# Patient Record
Sex: Female | Born: 1975 | Race: Black or African American | Hispanic: No | Marital: Single | Smoking: Never smoker
Health system: Southern US, Community
[De-identification: ages and names within clinical notes are randomized; demographics above are authoritative.]

## PROBLEM LIST (undated history)

## (undated) DIAGNOSIS — E119 Type 2 diabetes mellitus without complications: Secondary | ICD-10-CM

## (undated) DIAGNOSIS — I10 Essential (primary) hypertension: Secondary | ICD-10-CM

## (undated) DIAGNOSIS — I639 Cerebral infarction, unspecified: Secondary | ICD-10-CM

---

## 2006-06-02 ENCOUNTER — Emergency Department: Payer: Self-pay | Admitting: Internal Medicine

## 2007-12-18 ENCOUNTER — Emergency Department: Payer: Self-pay

## 2008-07-02 ENCOUNTER — Ambulatory Visit: Payer: Self-pay | Admitting: Obstetrics and Gynecology

## 2008-07-03 ENCOUNTER — Inpatient Hospital Stay: Payer: Self-pay | Admitting: Obstetrics and Gynecology

## 2009-05-05 ENCOUNTER — Emergency Department: Payer: Self-pay | Admitting: Emergency Medicine

## 2011-11-02 ENCOUNTER — Encounter: Payer: Self-pay | Admitting: Maternal & Fetal Medicine

## 2011-11-06 ENCOUNTER — Ambulatory Visit: Payer: Self-pay | Admitting: Obstetrics and Gynecology

## 2011-11-06 ENCOUNTER — Encounter: Payer: Self-pay | Admitting: Maternal & Fetal Medicine

## 2011-12-01 ENCOUNTER — Ambulatory Visit: Payer: Self-pay | Admitting: Obstetrics and Gynecology

## 2011-12-14 ENCOUNTER — Ambulatory Visit: Payer: Self-pay | Admitting: Obstetrics and Gynecology

## 2011-12-14 LAB — CBC WITH DIFFERENTIAL/PLATELET
Basophil #: 0 10*3/uL (ref 0.0–0.1)
Basophil %: 0.3 %
Eosinophil #: 0.1 10*3/uL (ref 0.0–0.7)
Eosinophil %: 0.8 %
HCT: 34.6 % — ABNORMAL LOW (ref 35.0–47.0)
MCH: 31.2 pg (ref 26.0–34.0)
MCV: 87 fL (ref 80–100)
Monocyte %: 7.5 %
Platelet: 219 10*3/uL (ref 150–440)
RBC: 3.96 10*6/uL (ref 3.80–5.20)
RDW: 14.7 % — ABNORMAL HIGH (ref 11.5–14.5)

## 2011-12-15 ENCOUNTER — Inpatient Hospital Stay: Payer: Self-pay | Admitting: Obstetrics and Gynecology

## 2011-12-15 LAB — PIH PROFILE
Anion Gap: 9 (ref 7–16)
Chloride: 106 mmol/L (ref 98–107)
Co2: 24 mmol/L (ref 21–32)
Creatinine: 0.78 mg/dL (ref 0.60–1.30)
EGFR (Non-African Amer.): >60
HCT: 28.8 % — ABNORMAL LOW (ref 35.0–47.0)
HGB: 10.1 g/dL — ABNORMAL LOW (ref 12.0–16.0)
MCH: 31.1 pg (ref 26.0–34.0)
MCHC: 35 g/dL (ref 32.0–36.0)
RDW: 14.6 % — ABNORMAL HIGH (ref 11.5–14.5)
Sodium: 139 mmol/L (ref 136–145)
Uric Acid: 6.4 mg/dL — ABNORMAL HIGH (ref 2.6–6.0)
WBC: 9.4 10*3/uL (ref 3.6–11.0)

## 2011-12-15 LAB — PROTEIN / CREATININE RATIO, URINE
Protein, Random Urine: 10 mg/dL (ref 0–12)
Protein/Creat. Ratio: 205 mg/gCREAT — ABNORMAL HIGH (ref 0–200)

## 2011-12-16 LAB — CBC WITH DIFFERENTIAL/PLATELET
Eosinophil #: 0 10*3/uL (ref 0.0–0.7)
Eosinophil %: 0.2 %
Lymphocyte #: 1.5 10*3/uL (ref 1.0–3.6)
MCH: 30.9 pg (ref 26.0–34.0)
MCHC: 35.2 g/dL (ref 32.0–36.0)
Monocyte #: 0.5 x10 3/mm (ref 0.2–0.9)
Neutrophil #: 9.1 10*3/uL — ABNORMAL HIGH (ref 1.4–6.5)
Neutrophil %: 81.4 %
Platelet: 217 10*3/uL (ref 150–440)
RBC: 3.17 10*6/uL — ABNORMAL LOW (ref 3.80–5.20)

## 2011-12-16 LAB — COMPREHENSIVE METABOLIC PANEL
Alkaline Phosphatase: 119 U/L (ref 50–136)
Anion Gap: 8 (ref 7–16)
BUN: 5 mg/dL — ABNORMAL LOW (ref 7–18)
Bilirubin,Total: 0.4 mg/dL (ref 0.2–1.0)
Calcium, Total: 8.5 mg/dL (ref 8.5–10.1)
Co2: 25 mmol/L (ref 21–32)
Creatinine: 0.95 mg/dL (ref 0.60–1.30)
EGFR (Non-African Amer.): >60
Osmolality: 277 (ref 275–301)
Potassium: 4 mmol/L (ref 3.5–5.1)
SGOT(AST): 37 U/L (ref 15–37)
Sodium: 139 mmol/L (ref 136–145)
Total Protein: 6.2 g/dL — ABNORMAL LOW (ref 6.4–8.2)

## 2014-05-19 NOTE — Consult Note (Signed)
Referral Information:   Reason for Referral 39 year old G4 P2012 with EDD 12/17/11 by LMP c/w US performed at [place] on 08/16/11; ega 22w 2 days at that time. She is presently 34 weeks 1 day and referred for newly diagnosed Gestational Diabetes.    Referring Physician [facility]    Prenatal Hx as above    Past Obstetrical Hx 2000 7lb 14 oz, female, c/s ([place]) 2010 9 lb, female, c/s [facility]) repeat TAb x 1   Home Medications: Medication Instructions Status  Prenatal Multivitamins oral tablet 1  orally once a day (at bedtime)  Active   Allergies:   NKA: None  Vital Signs/Notes:  Nursing Vital Signs:  **Vital Signs.:   07-Oct-13 11:25   Vital Signs Type Routine   Pulse Pulse 100   Pulse source if not from Vital Sign Device dinamap   Respirations Respirations 16   Systolic BP Systolic BP 902   Diastolic BP (mmHg) Diastolic BP (mmHg) 82   Mean BP 101   BP Source  if not from Vital Sign Device dinamap   Perinatal Consult:   PMed Hx Rubella Immune, Hx of varicella    Past Medical History cont'd negative    PSurg Hx c/s x 2    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  She initially expressed disbelief in the diagnosis of GDM and barriers to frequent visits for surveillance (new job in nursing care facility, inability to take time off work).  We are planning her follow up visits during her lunch hour. She met with the [facility]yles center today, received her supplies, and was prepared to follow diet and surveillance plans.We addressed the concerns associated with poorly controlled gestational diabetes including fetal macrosomia and stillbirth.  She receieved a log book and understand the blood glucose target goals.   2. She has had two prior cesarean deliveries and plans a  repeat cesarean section.    Recommendations 1. [facility] Education (performed today) Pt has log book, meter, supplies and received nutritional education 2. Growth scan and surveillance as outlined below. Can intiate weekly antenatal testing next week 3. We have scheduled a follow up mfm consult next week (during patients lunch hour) to review blood glucose log.  We addressed intiation of oral Rx if blood glucose values are elevated despite nutritional changes 4. I will plan to address other aspects of long term risk associated with GDM in pregnancy and her ability to modify risks (eg. breastfeeding, exercise/weight loss) during her next visit. 5.  If you would like her to have any of her antental testing or follow up ultrasounds at the time of her mfm consultations, please let us know.  Thank you.   Plan:   Ultrasound at what gestational ages Monthly > 28 weeks    Antepartum Testing Weekly, Starting at 34 to 36 weeks, can initiate next week    Delivery Mode Cesarean    Delivery at what gestational age [redacted] weeks, sooner, if evidence of fetopathy     Total Time Spent with Patient 15 minutes    >50% of visit spent in couseling/coordination of care yes    Office Use Only [address]  Level 1 (44min) NEW office consult prob focused   Coding Description: MATERNAL CONDITIONS/HISTORY INDICATION(S).   OTHER: gestational  diabetes.  Electronic Signatures: Manfred Shirts (MD)  (Signed 07-Oct-13 15:26)  Authored: Referral, Home Medications, Allergies, Vital Signs/Notes, Consult, Lab/Radiology Notes, Impression, Plan, Billing, Coding Description   Last Updated: 07-Oct-13 15:26 by Manfred Shirts (MD)

## 2014-05-19 NOTE — Op Note (Signed)
PATIENT NAME:  Cheyenne Martinez, Cheyenne Martinez MR#:  [id number] DATE OF BIRTH:  1975-09-10  DATE OF PROCEDURE:  12/15/2011  PREOPERATIVE DIAGNOSES:  1. Intrauterine pregnancy at 39 weeks, five days gestation.  2. History of two previous low transverse cesarean sections.   POSTOPERATIVE DIAGNOSES:  1. Intrauterine pregnancy at 39 weeks, five days gestation.  2. History of two previous low transverse cesarean sections.   PROCEDURE PERFORMED: Repeat low transverse cesarean section.   ANESTHESIA: Spinal.   PRIMARY SURGEON: Lorrene ReidAndreas Martinez Shelsy Seng, MD  OPERATIVE FLUIDS: 2400 mL of crystalloid.   URINE OUTPUT: 180 mL urine.   ESTIMATED BLOOD LOSS:  One liter.  DRAINS OR TUBES: On-Q catheters as well as a Foley to gravity drainage.   PREOPERATIVE ANTIBIOTICS: 2 grams Ancef.   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 Upon entry into the peritoneal cavity there was a peritoneal window which allowed  access to the uterus. However, there was a thick adhesion of the uterus to the rectus muscle sheath and peritoneum which caused difficulty in gaining appropriate access to the lower uterine segment. The bladder was unable to be displaced off the lower uterine segment. The incision was made low transverse. However, given the thickness of the myometrium at the area of the incision, it cannot be ruled out that the contractile portion of the uterus was entered. The delivery resulted in the birth of a liveborn female infant weighing 3820 grams, Apgars 8 and 9. Given the difficult nature of the cesarean section and concern of location of the bladder, the bladder was retrograde filled with sterile milk following closure of the hysterotomy incision, noting the bladder to be an intact without extravasation of milk. Given the location of the hysterotomy as well as the adhesions encountered, the patient was advised not to attempt future pregnancy.  If she were to become pregnant again, I  would recommend repeat C-section at 37 weeks given concern over entry into the contractile portion of the myometrium, as well as a vertical skin incision for entry.   SPECIMENS REMOVED: None.   PATIENT CONDITION FOLLOWING PROCEDURE: Stable.   PROCEDURE IN DETAIL: The risks, benefits, and alternatives of the procedure were discussed with the patient prior to proceeding to the operating room. The patient was administered spinal anesthesia and positioned in the supine position. She was prepped and draped in the usual sterile fashion. Time-out was performed prior to beginning the procedure. The level of anesthetic was checked and noted to be adequate. A Pfannenstiel skin incision was made 2 cm above the pubic symphysis utilizing the patient's pre-existing scar. This was carried down sharply to the level of the rectus fascia. The fascia was incised in the midline and the fascial incision was extended using Mayo scissors. The superior border of the rectus fascia was grasped with two Kocher  clamps. The underlying rectus muscles were bluntly dissected off the rectus sheath and the median raphe was fairly scarred and was taken down using the knife. The inferior border of the rectus fascia was dissected off the rectus muscles in a similar fashion. The midline was identified. The peritoneum was grasped with a hemostat and incised using Metzenbaum scissors. Following this, the peritoneal incision was extended using manual traction. However, it was noted that there was significant adhesive disease of the uterus to the rectus muscles and peritoneum. Several thick bands of scar tissue to the previous hysterotomy incision had to be taken down using the Bovie. Following this, the bladder was identified  but scarred to prior hysterotomy incision. A bladder flap could therefore not be created. A hysterotomy incision was made just above the bladder reflection. This was carried down sharply using the knife. The uterus was entered  bluntly using the operator's finger. The hysterotomy incision was extended using manual traction. Amniotomy was performed using an Allis clamp, noting clear fluid. The operator's hand was placed into the hysterotomy incision, finding the infant in the vertex position. The head was grasped, flexed, brought to the incision, and delivered atraumatically using fundal pressure. Cord was clamped and cut. The infant was suctioned and passed to the awaiting pediatricians. The placenta was delivered using manual extraction. Sufficient lysis of adhesions had been undertaken to allow exteriorization of the uterus. The placenta was delivered using manual extraction. The uterus was wiped clean of clots and debris. The hysterotomy incision was then repaired using a two-layer closure of 0 Vicryl, the first being a running locked, the second a vertical imbricating layer. There was some defect superior to the hysterotomy incision where some adhesions were taken down. This was also closed using a running 0 Vicryl locked stitch. Several figure-of-eights were placed along the hysterotomy incision in order to achieve adequate hemostasis. The bladder was noted to be away from the hysterotomy incision, but close enough that the decision was made to instill the bladder with sterile milk. Indigo carmine had been given IV prior to the hysterotomy incision. There was no extravasation of blue dye noted during the case.  Urine was blue at this time. Approximately 120 mL of sterile milk were instilled in the patient's bladder with no extravasation noted. The ON-Q catheters were placed in the usual fashion. Following placement of the On-Q catheters, the fascia was closed using a single #1 looped PDS in a running fashion. The subcutaneous tissue was irrigated and hemostasis was achieved using the Bovie. Skin was closed using staples. Sponge, needle, and instrument counts were correct times two. The patient was taken to the recovery room in stable  condition. As previously mentioned, there was concern for entry into the contractile portion of the myometrium. The patient should strongly considering not trying to achieve future pregnancies given the amount of adhesive disease encountered. If she does become pregnant, she was counseled that she will need a repeat C-section at 37 weeks and I would  recommend a vertical skin incision for gaining entry into the peritoneal cavity.    ____________________________ Florina Ou. Bonney Aid, MD ams:bjt D: 12/16/2011 23:07:31 ET T: 12/17/2011 13:06:53 ET JOB#: [id number]  cc: Florina Ou. Bonney Aid, MD, <Dictator> Lorrene Reid MD ELECTRONICALLY SIGNED 12/19/2011 22:03

## 2015-04-29 NOTE — Telephone Encounter (Signed)
Opened in error.  Wrong patient (multiple Elizbeth Trinka's) selected.

## 2015-05-03 ENCOUNTER — Emergency Department
Admission: EM | Admit: 2015-05-03 | Discharge: 2015-05-03 | Disposition: A | Discharge status: Home / Self Care | Payer: Self-pay | Attending: Emergency Medicine | Admitting: Emergency Medicine

## 2015-05-03 ENCOUNTER — Encounter: Payer: Self-pay | Admitting: Emergency Medicine

## 2015-05-03 DIAGNOSIS — T148 Other injury of unspecified body region: Secondary | ICD-10-CM | POA: Insufficient documentation

## 2015-05-03 DIAGNOSIS — Y998 Other external cause status: Secondary | ICD-10-CM | POA: Insufficient documentation

## 2015-05-03 DIAGNOSIS — Y9389 Activity, other specified: Secondary | ICD-10-CM | POA: Insufficient documentation

## 2015-05-03 DIAGNOSIS — I10 Essential (primary) hypertension: Secondary | ICD-10-CM

## 2015-05-03 DIAGNOSIS — T148XXA Other injury of unspecified body region, initial encounter: Secondary | ICD-10-CM

## 2015-05-03 DIAGNOSIS — Z79899 Other long term (current) drug therapy: Secondary | ICD-10-CM | POA: Insufficient documentation

## 2015-05-03 DIAGNOSIS — Y92410 Unspecified street and highway as the place of occurrence of the external cause: Secondary | ICD-10-CM | POA: Insufficient documentation

## 2015-05-03 MED ORDER — NAPROXEN 500 MG PO TABS
500.0000 mg | ORAL_TABLET | Freq: Once | ORAL | Status: AC
  Administered 2015-05-03: 500 mg via ORAL
  Filled 2015-05-03: qty 1

## 2015-05-03 MED ORDER — NAPROXEN 500 MG PO TABS: 500.0000 mg | ORAL_TABLET | Freq: Two times a day (BID) | ORAL | Status: AC

## 2015-05-03 MED ORDER — CYCLOBENZAPRINE HCL 10 MG PO TABS
5.0000 mg | ORAL_TABLET | Freq: Once | ORAL | Status: AC
  Administered 2015-05-03: 5 mg via ORAL
  Filled 2015-05-03: qty 1

## 2015-05-03 MED ORDER — CYCLOBENZAPRINE HCL 5 MG PO TABS: 5.0000 mg | ORAL_TABLET | Freq: Three times a day (TID) | ORAL | Status: DC | PRN
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
## 2015-05-03 NOTE — Discharge Instructions (Signed)

## 2015-05-03 NOTE — ED Notes (Signed)
AAOx3.  Skin warm and dry.  NAD 
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
## 2021-07-01 ENCOUNTER — Emergency Department: Payer: Managed Care, Other (non HMO)

## 2021-07-01 ENCOUNTER — Observation Stay: Payer: Managed Care, Other (non HMO)

## 2021-07-01 ENCOUNTER — Inpatient Hospital Stay
Admission: EM | Admit: 2021-07-01 | Discharge: 2021-07-04 | DRG: 066 | Disposition: A | Discharge status: Home / Self Care | Payer: Managed Care, Other (non HMO) | Attending: Internal Medicine | Admitting: Internal Medicine

## 2021-07-01 DIAGNOSIS — R29701 NIHSS score 1: Secondary | ICD-10-CM | POA: Diagnosis present

## 2021-07-01 DIAGNOSIS — Z833 Family history of diabetes mellitus: Secondary | ICD-10-CM

## 2021-07-01 DIAGNOSIS — E785 Hyperlipidemia, unspecified: Secondary | ICD-10-CM | POA: Diagnosis not present

## 2021-07-01 DIAGNOSIS — I10 Essential (primary) hypertension: Secondary | ICD-10-CM | POA: Diagnosis present

## 2021-07-01 DIAGNOSIS — G9389 Other specified disorders of brain: Secondary | ICD-10-CM | POA: Diagnosis present

## 2021-07-01 DIAGNOSIS — E1165 Type 2 diabetes mellitus with hyperglycemia: Secondary | ICD-10-CM | POA: Diagnosis present

## 2021-07-01 DIAGNOSIS — R4702 Dysphasia: Secondary | ICD-10-CM | POA: Diagnosis present

## 2021-07-01 DIAGNOSIS — Z8249 Family history of ischemic heart disease and other diseases of the circulatory system: Secondary | ICD-10-CM

## 2021-07-01 DIAGNOSIS — E119 Type 2 diabetes mellitus without complications: Secondary | ICD-10-CM | POA: Diagnosis not present

## 2021-07-01 DIAGNOSIS — I63532 Cerebral infarction due to unspecified occlusion or stenosis of left posterior cerebral artery: Principal | ICD-10-CM | POA: Diagnosis present

## 2021-07-01 DIAGNOSIS — R4182 Altered mental status, unspecified: Secondary | ICD-10-CM | POA: Diagnosis present

## 2021-07-01 DIAGNOSIS — I160 Hypertensive urgency: Secondary | ICD-10-CM

## 2021-07-01 DIAGNOSIS — R001 Bradycardia, unspecified: Secondary | ICD-10-CM | POA: Diagnosis present

## 2021-07-01 DIAGNOSIS — E7800 Pure hypercholesterolemia, unspecified: Secondary | ICD-10-CM | POA: Diagnosis present

## 2021-07-01 DIAGNOSIS — I639 Cerebral infarction, unspecified: Secondary | ICD-10-CM | POA: Diagnosis not present

## 2021-07-01 LAB — COMPREHENSIVE METABOLIC PANEL
ALT: 8 U/L (ref 0–44)
AST: 15 U/L (ref 15–41)
Albumin: 3.3 g/dL — ABNORMAL LOW (ref 3.5–5.0)
Alkaline Phosphatase: 71 U/L (ref 38–126)
Anion gap: 11 (ref 5–15)
BUN: 8 mg/dL (ref 6–20)
CO2: 22 mmol/L (ref 22–32)
Calcium: 8.8 mg/dL — ABNORMAL LOW (ref 8.9–10.3)
Chloride: 102 mmol/L (ref 98–111)
Creatinine, Ser: 0.71 mg/dL (ref 0.44–1.00)
GFR, Estimated: >60 mL/min (ref >60)
Glucose, Bld: 356 mg/dL — ABNORMAL HIGH (ref 70–99)
Potassium: 3.6 mmol/L (ref 3.5–5.1)
Sodium: 135 mmol/L (ref 135–145)
Total Bilirubin: 0.5 mg/dL (ref 0.3–1.2)
Total Protein: 7.3 g/dL (ref 6.5–8.1)

## 2021-07-01 LAB — CBC WITH DIFFERENTIAL/PLATELET
Abs Immature Granulocytes: 0.02 10*3/uL (ref 0.00–0.07)
Abs Immature Granulocytes: 0.01 10*3/uL (ref 0.00–0.07)
Basophils Absolute: 0 10*3/uL (ref 0.0–0.1)
Basophils Absolute: 0 10*3/uL (ref 0.0–0.1)
Basophils Relative: 1 %
Basophils Relative: 1 %
Eosinophils Absolute: 0 10*3/uL (ref 0.0–0.5)
Eosinophils Absolute: 0 10*3/uL (ref 0.0–0.5)
Eosinophils Relative: 1 %
Eosinophils Relative: 1 %
HCT: 34.8 % — ABNORMAL LOW (ref 36.0–46.0)
HCT: 36.6 % (ref 36.0–46.0)
Hemoglobin: 12 g/dL (ref 12.0–15.0)
Hemoglobin: 12.7 g/dL (ref 12.0–15.0)
Immature Granulocytes: 0 %
Immature Granulocytes: 0 %
Lymphocytes Relative: 31 %
Lymphocytes Relative: 32 %
Lymphs Abs: 2 10*3/uL (ref 0.7–4.0)
Lymphs Abs: 2.3 10*3/uL (ref 0.7–4.0)
MCH: 28.4 pg (ref 26.0–34.0)
MCH: 28.8 pg (ref 26.0–34.0)
MCHC: 34.5 g/dL (ref 30.0–36.0)
MCHC: 34.7 g/dL (ref 30.0–36.0)
MCV: 82.5 fL (ref 80.0–100.0)
MCV: 83 fL (ref 80.0–100.0)
Monocytes Absolute: 0.4 10*3/uL (ref 0.1–1.0)
Monocytes Absolute: 0.5 10*3/uL (ref 0.1–1.0)
Monocytes Relative: 7 %
Monocytes Relative: 6 %
Neutro Abs: 3.9 10*3/uL (ref 1.7–7.7)
Neutro Abs: 4.3 10*3/uL (ref 1.7–7.7)
Neutrophils Relative %: 60 %
Neutrophils Relative %: 60 %
Platelets: 272 10*3/uL (ref 150–400)
Platelets: 330 10*3/uL (ref 150–400)
RBC: 4.22 MIL/uL (ref 3.87–5.11)
RBC: 4.41 MIL/uL (ref 3.87–5.11)
RDW: 12.3 % (ref 11.5–15.5)
RDW: 12.4 % (ref 11.5–15.5)
WBC: 6.3 10*3/uL (ref 4.0–10.5)
WBC: 7.1 10*3/uL (ref 4.0–10.5)
nRBC: 0 % (ref 0.0–0.2)
nRBC: 0 % (ref 0.0–0.2)

## 2021-07-01 LAB — URINALYSIS, ROUTINE W REFLEX MICROSCOPIC
Bacteria, UA: NONE SEEN
Bilirubin Urine: NEGATIVE
Glucose, UA: >=500 mg/dL — AB
Hgb urine dipstick: NEGATIVE
Ketones, ur: NEGATIVE mg/dL
Leukocytes,Ua: NEGATIVE
Nitrite: NEGATIVE
Protein, ur: NEGATIVE mg/dL
Specific Gravity, Urine: 1.02 (ref 1.005–1.030)
pH: 7 (ref 5.0–8.0)

## 2021-07-01 LAB — URINE DRUG SCREEN, QUALITATIVE (ARMC ONLY)
Amphetamines, Ur Screen: NOT DETECTED
Barbiturates, Ur Screen: NOT DETECTED
Benzodiazepine, Ur Scrn: NOT DETECTED
Cannabinoid 50 Ng, Ur ~~LOC~~: NOT DETECTED
Cocaine Metabolite,Ur ~~LOC~~: NOT DETECTED
MDMA (Ecstasy)Ur Screen: NOT DETECTED
Methadone Scn, Ur: NOT DETECTED
Opiate, Ur Screen: NOT DETECTED
Phencyclidine (PCP) Ur S: NOT DETECTED
Tricyclic, Ur Screen: NOT DETECTED

## 2021-07-01 LAB — LIPID PANEL
Cholesterol: 263 mg/dL — ABNORMAL HIGH (ref 0–200)
HDL: 36 mg/dL — ABNORMAL LOW (ref >40)
LDL Cholesterol: 189 mg/dL — ABNORMAL HIGH (ref 0–99)
Total CHOL/HDL Ratio: 7.3 RATIO
Triglycerides: 188 mg/dL — ABNORMAL HIGH (ref <150)
VLDL: 38 mg/dL (ref 0–40)

## 2021-07-01 LAB — GLUCOSE, CAPILLARY: Glucose-Capillary: 265 mg/dL — ABNORMAL HIGH (ref 70–99)

## 2021-07-01 LAB — ACETAMINOPHEN LEVEL: Acetaminophen (Tylenol), Serum: <10 ug/mL — ABNORMAL LOW (ref 10–30)

## 2021-07-01 LAB — CBG MONITORING, ED: Glucose-Capillary: 381 mg/dL — ABNORMAL HIGH (ref 70–99)

## 2021-07-01 LAB — HCG, QUANTITATIVE, PREGNANCY: hCG, Beta Chain, Quant, S: <1 m[IU]/mL (ref <5)

## 2021-07-01 LAB — SALICYLATE LEVEL: Salicylate Lvl: <7 mg/dL — ABNORMAL LOW (ref 7.0–30.0)

## 2021-07-01 LAB — POC URINE PREG, ED: Preg Test, Ur: NEGATIVE

## 2021-07-01 LAB — ETHANOL: Alcohol, Ethyl (B): <10 mg/dL (ref <10)

## 2021-07-01 LAB — TRIGLYCERIDES: Triglycerides: 190 mg/dL — ABNORMAL HIGH (ref <150)

## 2021-07-01 MED ORDER — ONDANSETRON HCL 4 MG/2ML IJ SOLN: 4.0000 mg | Freq: Four times a day (QID) | INTRAMUSCULAR | Status: DC | PRN

## 2021-07-01 MED ORDER — SODIUM CHLORIDE 0.9 % IV BOLUS
1000.0000 mL | Freq: Once | INTRAVENOUS | Status: AC
  Administered 2021-07-01: 1000 mL via INTRAVENOUS

## 2021-07-01 MED ORDER — SODIUM CHLORIDE 0.9 % IV SOLN: INTRAVENOUS | Status: DC

## 2021-07-01 MED ORDER — TRAZODONE HCL 50 MG PO TABS: 25.0000 mg | ORAL_TABLET | Freq: Every evening | ORAL | Status: DC | PRN

## 2021-07-01 MED ORDER — INSULIN ASPART 100 UNIT/ML IJ SOLN: 0.0000 [IU] | Freq: Three times a day (TID) | INTRAMUSCULAR | Status: DC

## 2021-07-01 MED ORDER — ATORVASTATIN CALCIUM 20 MG PO TABS
20.0000 mg | ORAL_TABLET | Freq: Every day | ORAL | Status: DC
Start: 2021-07-01 — End: 2021-07-03
  Administered 2021-07-01 – 2021-07-02 (×2): 20 mg via ORAL
  Filled 2021-07-01 (×2): qty 1

## 2021-07-01 MED ORDER — ONDANSETRON HCL 4 MG PO TABS: 4.0000 mg | ORAL_TABLET | Freq: Four times a day (QID) | ORAL | Status: DC | PRN

## 2021-07-01 MED ORDER — STROKE: EARLY STAGES OF RECOVERY BOOK
Freq: Once | Status: AC
Start: 2021-07-01 — End: 2021-07-01

## 2021-07-01 MED ORDER — MAGNESIUM HYDROXIDE 400 MG/5ML PO SUSP: 30.0000 mL | Freq: Every day | ORAL | Status: DC | PRN

## 2021-07-01 MED ORDER — LISINOPRIL 5 MG PO TABS
5.0000 mg | ORAL_TABLET | Freq: Every day | ORAL | Status: DC
Start: 2021-07-02 — End: 2021-07-03
  Administered 2021-07-02: 5 mg via ORAL
  Filled 2021-07-01: qty 1

## 2021-07-01 MED ORDER — ASPIRIN 81 MG PO TBEC
81.0000 mg | DELAYED_RELEASE_TABLET | Freq: Every day | ORAL | Status: DC
Start: 2021-07-01 — End: 2021-07-04
  Administered 2021-07-01 – 2021-07-04 (×4): 81 mg via ORAL
  Filled 2021-07-01 (×4): qty 1

## 2021-07-01 MED ORDER — CLOPIDOGREL BISULFATE 75 MG PO TABS
75.0000 mg | ORAL_TABLET | Freq: Every day | ORAL | Status: DC
  Administered 2021-07-01 – 2021-07-04 (×4): 75 mg via ORAL
  Filled 2021-07-01 (×4): qty 1

## 2021-07-01 MED ORDER — LACTATED RINGERS IV BOLUS: 1000.0000 mL | Freq: Once | INTRAVENOUS | Status: DC

## 2021-07-01 MED ORDER — LABETALOL HCL 5 MG/ML IV SOLN
20.0000 mg | INTRAVENOUS | Status: DC | PRN
  Administered 2021-07-01: 20 mg via INTRAVENOUS
  Filled 2021-07-01 (×2): qty 4

## 2021-07-01 MED ORDER — ACETAMINOPHEN 650 MG RE SUPP: 650.0000 mg | Freq: Four times a day (QID) | RECTAL | Status: DC | PRN

## 2021-07-01 MED ORDER — GADOBUTROL 1 MMOL/ML IV SOLN
7.0000 mL | Freq: Once | INTRAVENOUS | Status: AC | PRN
Start: 2021-07-01 — End: 2021-07-01
  Administered 2021-07-01: 7 mL via INTRAVENOUS

## 2021-07-01 MED ORDER — ACETAMINOPHEN 325 MG PO TABS: 650.0000 mg | ORAL_TABLET | Freq: Four times a day (QID) | ORAL | Status: DC | PRN

## 2021-07-01 MED ORDER — ENOXAPARIN SODIUM 40 MG/0.4ML IJ SOSY
40.0000 mg | PREFILLED_SYRINGE | INTRAMUSCULAR | Status: DC
  Administered 2021-07-02 – 2021-07-04 (×3): 40 mg via SUBCUTANEOUS
  Filled 2021-07-01 (×3): qty 0.4

## 2021-07-01 NOTE — Assessment & Plan Note (Addendum)
-   This is obviously new onset hypertension is well. - Given new onset diabetes mellitus I will start her on ACE inhibitor therapy and place her on as needed IV labetalol.

## 2021-07-01 NOTE — Assessment & Plan Note (Addendum)
Inpatient status, CVA admission Continue NChecks MRI brain --Evolving early subacute left PCA distribution infarct as above, with additional scattered small volume ischemic changes within the left frontotemporal and right parietal lobe as above. Associated mild petechial blood products at the left occipital lobe without frank hemorrhagic transformation or significant mass effect. 2. 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 US Neck- There is a short-length accumulation of soft plaque along posterior wall of the proximal to mid right CCA but the resulting vessel narrowing is no more than 20%.  Neuro recommends CTA Head/Neck   Asa/plavix/statin Echo pending - Neurology consult will be obtained as well as PT/OT and ST consults.

## 2021-07-01 NOTE — H&P (Addendum)
PATIENT NAME: Cheyenne Martinez    MR#:  [id number]  DATE OF BIRTH:  07/13/1975  DATE OF ADMISSION:  07/01/2021  PRIMARY CARE PHYSICIAN: Pcp, No   Patient is coming from:   Home  REQUESTING/REFERRING PHYSICIAN: Alfonse Flavors, MD  CHIEF COMPLAINT:   Chief Complaint  Patient presents with   Altered Mental Status    HISTORY OF PRESENT ILLNESS:  Cheyenne Martinez is a 46 y.o. African-American female with no chronic medical problems, who presented to the ER with acute onset of altered mental status and difficulty finding words with occasional slurred speech over the last couple of days.  She denied any headache or dizziness or blurred vision or diplopia.  She denies any tingling or numbness or focal muscle weakness over the her upper or lower extremities.  No tinnitus or vertigo.  No witnessed seizures.  No urinary or stool incontinence.  She denies any chest pain or palpitations.  No presyncope or syncope.  No nausea or vomiting or abdominal pain.  No cough or wheezing or dyspnea.  No dysuria, oliguria or hematuria or flank pain.  ED Course: When she came to the ER BP was 198/125 with a heart rate of 102 and otherwise normal vital signs.  Labs revealed hyperglycemia 356 with albumin of 3.3.  She had a total cholesterol of 263 with HDL 36 and LDL of 189 and triglycerides 190.  CBC was within normal urine (2 negative and quantitative test was negative as well.  UA was positive for more than 500 glucose.  Dog levels less than 10 and salicylate less than 7 urine drug screen came back negative. EKG as reviewed by me : EKG showed normal sinus rhythm with a rate of 96 with poor R wave progression Imaging: Noncontrast head CT scan revealed low-density changes in the left occipital lobe suggesting sequela of a late subacute to chronic infarct with recommendation for MRI.  It showed encephalomalacia in the right cerebellar hemisphere compatible with remote insult.  Brain MRI with without  contrast was ordered and is currently pending.  The patient will be admitted to an observation medical telemetry for further evaluation and management. PAST MEDICAL HISTORY:  History reviewed. No pertinent past medical history.  No previous medical problems.  PAST SURGICAL HISTORY:   C-section X3  SOCIAL HISTORY:   Social History   Tobacco Use   Smoking status: Never   Smokeless tobacco: Not on file  Substance Use Topics   Alcohol use: No  No history of tobacco or HH abuse or illicit drug use.  FAMILY HISTORY:   Positive hypertension and diabetes mellitus.  DRUG ALLERGIES:  No Known Allergies  REVIEW OF SYSTEMS:   ROS As per history of present illness. All pertinent systems were reviewed above. Constitutional, HEENT, cardiovascular, respiratory, GI, GU, musculoskeletal, neuro, psychiatric, endocrine, integumentary and hematologic systems were reviewed and are otherwise negative/unremarkable except for positive findings mentioned above in the HPI.   MEDICATIONS AT HOME:   Prior to Admission medications   Medication Sig Start Date End Date Taking? Authorizing Provider  cyclobenzaprine (FLEXERIL) 5 MG tablet Take 1 tablet (5 mg total) by mouth 3 (three) times daily as needed for muscle spasms. Patient not taking: Reported on 07/01/2021 05/03/15   Triplett, Rulon Eisenmenger B, FNP      VITAL SIGNS:  Blood pressure (!) 189/107, pulse 76, temperature 99 F (37.2 C), temperature source Oral, resp. rate (!) 23, SpO2 100 %.  PHYSICAL EXAMINATION:  Physical Exam  GENERAL:  46 y.o.-year-old African-American female patient lying in the bed with no acute distress.  EYES: Pupils equal, round, reactive to light and accommodation. No scleral icterus. Extraocular muscles intact.  HEENT: Head atraumatic, normocephalic. Oropharynx and nasopharynx clear.  NECK:  Supple, no jugular venous distention. No thyroid enlargement, no tenderness.  LUNGS: Normal breath sounds bilaterally, no wheezing,  rales,rhonchi or crepitation. No use of accessory muscles of respiration.  CARDIOVASCULAR: Regular rate and rhythm, S1, S2 normal. No murmurs, rubs, or gallops.  ABDOMEN: Soft, nondistended, nontender. Bowel sounds present. No organomegaly or mass.  EXTREMITIES: No pedal edema, cyanosis, or clubbing.  NEUROLOGIC: Cranial nerves II through XII are intact except for persistent expressive dysphagia. Muscle strength 5/5 in all extremities. Sensation intact to light touch. Gait not checked.  Normal heel-to-shin test bilaterally.  Normal finger-to-nose however she had mild right dysmetria with finger to finger test. PSYCHIATRIC: The patient is alert and oriented x 3.  Normal affect and good eye contact. SKIN: No obvious rash, lesion, or ulcer.   LABORATORY PANEL:   CBC Recent Labs  Lab 07/01/21 1955  WBC 7.1  HGB 12.7  HCT 36.6  PLT 330   ------------------------------------------------------------------------------------------------------------------  Chemistries  Recent Labs  Lab 07/01/21 1852  NA 135  K 3.6  CL 102  CO2 22  GLUCOSE 356*  BUN 8  CREATININE 0.71  CALCIUM 8.8*  AST 15  ALT 8  ALKPHOS 71  BILITOT 0.5   ------------------------------------------------------------------------------------------------------------------  Cardiac Enzymes No results for input(s): TROPONINI in the last 168 hours. ------------------------------------------------------------------------------------------------------------------  RADIOLOGY:  CT Head Wo Contrast  Result Date: 07/01/2021 CLINICAL DATA:  Mental status change, unknown cause EXAM: CT HEAD WITHOUT CONTRAST TECHNIQUE: Contiguous axial images were obtained from the base of the skull through the vertex without intravenous contrast. RADIATION DOSE REDUCTION: This exam was performed according to the departmental dose-optimization program which includes automated exposure control, adjustment of the mA and/or kV according to patient  size and/or use of iterative reconstruction technique. COMPARISON:  None Available. FINDINGS: Brain: Low-density changes within the left occipital lobe suggest sequela of a late subacute to chronic infarct. Encephalomalacia in the right cerebellar hemisphere compatible with remote insult. No intracranial hemorrhage. No hydrocephalus. No extra-axial collection. Vascular: No hyperdense vessel or unexpected calcification. Skull: Normal. Negative for fracture or focal lesion. Sinuses/Orbits: No acute finding. Other: None. IMPRESSION: 1. Low-density changes within the left occipital lobe suggest sequela of a late subacute to chronic infarct. Further evaluation with MRI is suggested. 2. Encephalomalacia in the right cerebellar hemisphere compatible with remote insult. Electronically Signed   By: Duanne Guess D.O.   On: 07/01/2021 19:05   US Carotid Bilateral (at [facility] and [facility] only)  Result Date: 07/01/2021 CLINICAL DATA:  Infarct in the left parieto-occipital area on CT today with late-subacute appearance and more chronic appearing patchy infarct in the right cerebellar hemisphere, evaluate carotid disease. EXAM: BILATERAL CAROTID DUPLEX ULTRASOUND TECHNIQUE: Wallace Cullens scale imaging, color Doppler and duplex ultrasound were performed of bilateral carotid and vertebral arteries in the neck. COMPARISON:  No prior vascular study. Images of today's head CT reviewed for correlation. FINDINGS: Criteria: Quantification of carotid stenosis is based on velocity parameters that correlate the residual internal carotid diameter with NASCET-based stenosis levels, using the diameter of the distal internal carotid lumen as the denominator for stenosis measurement. The following velocity measurements were obtained: RIGHT ICA: 91 cm/sec CCA: 60 cm/sec SYSTOLIC ICA/CCA RATIO:  1.5 ECA: 51 cm/sec LEFT ICA: 107 cm/sec  CCA: 74 cm/sec SYSTOLIC ICA/CCA RATIO:  1.4 ECA: 91 cm/sec RIGHT CAROTID ARTERY: There is mild intimal thickening in the  common carotid artery. There is an accumulation of soft plaque along a 2 cm length of the proximal to mid common carotid artery, but this only narrows the lumen no more than 20% and is essentially nonstenosing. There are no visible plaques in the carotid bifurcation, cervical ICA. RIGHT VERTEBRAL ARTERY: There is antegrade flow with low resistance waveform. LEFT CAROTID ARTERY: Mild intimal thickening is noted. No focal plaque is seen. LEFT VERTEBRAL ARTERY: There is antegrade flow with low resistance waveform. Upper extremity blood pressures: RIGHT: Information not provided by tech. LEFT: Information not provided by tech. IMPRESSION: No sonographic evidence of hemodynamically significant cervical carotid system stenosis. There is a short-length accumulation of soft plaque along posterior wall of the proximal to mid right CCA but the resulting vessel narrowing is no more than 20%. No other focal plaques are observed. There is mild intimal thickening of both common carotid arteries. Electronically Signed   By: Almira BarKeith  Chesser M.D.   On: 07/01/2021 22:09      IMPRESSION AND PLAN:  Assessment and Plan: * CVA (cerebral vascular accident) [facility](HCC) - The patient will be admitted to an observation medical telemetry bed. - We will follow neurochecks every 4 hours for 24 hours. - I will follow-up brain MRI results. - I will place her on aspirin as well as Plavix. - We will add statin therapy especially given her dyslipidemia. - 2D echo with bubble study as well as bradycardia Doppler will be obtained. - Neurology consult will be obtained as well as PT/OT and ST consults. - I notified Dr. Otelia LimesLindzen about the patient.  Uncontrolled type 2 diabetes mellitus with hyperglycemia, without long-term current use of insulin (HCC) - This is new onset likely type 2 diabetes mellitus. - The patient will be placed on supplement coverage with resistant NovoLog protocol. - We will check her hemoglobin A1c. - Diabetic education  will be provided.  Hypertensive urgency - This is obviously new onset hypertension is well. - Given new onset diabetes mellitus I will start her on ACE inhibitor therapy and place her on as needed IV labetalol.  Dyslipidemia - She likely has new onset/newly diagnosed dyslipidemia. - We will place the patient on statin therapy. - Though this is a nonfasting sample I will still start statin special given her CVA, her LDL will need to be optimized.   DVT prophylaxis: Lovenox.  Advanced Care Planning:  Code Status: full code.  Family Communication:  The plan of care was discussed in details with the patient (and family). I answered all questions. The patient agreed to proceed with the above mentioned plan. Further management will depend upon hospital course. Disposition Plan: Back to previous home environment Consults called: Neurology.   All the records are reviewed and case discussed with ED provider.  Status is: Observation   I certify that at the time of admission, it is my clinical judgment that the patient will require hospital care extending less than 2 midnights.                            Dispo: The patient is from: Home              Anticipated d/c is to: Home              Patient currently is not medically stable to d/c.  Difficult to place patient: No  Cheyenne Martinez M.D on 07/01/2021 at 10:21 PM  [facility]   From 7 PM-7 AM, contact night-coverage [email or web address]  CC: Primary care physician; Pcp, No

## 2021-07-01 NOTE — ED Triage Notes (Addendum)
Pt to ED via [facility] from home after daughter called for increasing confusion. Pt A&Ox2.   Pt daughter states she called her on Tuesday and pt was fine. Daughter states she went to check on her yesterday she seemed more altered and confused. Pt was not recognizing people or seeming more forgetful. Pt with no hx DM and CBG in triage 381.  EMS VS:  BP 198/107 O2 100% RR 30 Co2 40 HR 100 CBG 405

## 2021-07-01 NOTE — Assessment & Plan Note (Addendum)
-   She likely has new onset/newly diagnosed dyslipidemia. - We will place the patient on statin therapy. - Though this is a nonfasting sample I will still start statin special given her CVA, her LDL will need to be optimized.

## 2021-07-01 NOTE — ED Provider Notes (Signed)
[facility] Provider Note    Event Date/Time   First MD Initiated Contact with Patient 07/01/21 1836     (approximate)   History   Chief Complaint: Altered Mental Status   HPI  Cheyenne Martinez is a 46 y.o. female with no known past medical history who was brought to the ED due to confusion for the past 2 weeks, constant, waxing and waning.  No recent trauma, no neck pain or stiffness or fever.  Patient denies any other symptoms other than confusion.  Patient's mother at bedside also does not know any additional concerns.  They do note that she had a similar episode of confusion a few years ago which resolved.      Physical Exam   Triage Vital Signs: ED Triage Vitals  Enc Vitals Group     BP 07/01/21 1759 (!) 198/125     Pulse Rate 07/01/21 1759 (!) 102     Resp 07/01/21 1759 18     Temp 07/01/21 1807 98.1 F (36.7 C)     Temp Source 07/01/21 1807 Oral     SpO2 07/01/21 1759 99 %     Weight --      Height --      Head Circumference --      Peak Flow --      Pain Score --      Pain Loc --      Pain Edu? --      Excl. in GC? --     Most recent vital signs: Vitals:   07/01/21 1807 07/01/21 1953  BP:  (!) 187/123  Pulse:  83  Resp:  20  Temp: 98.1 F (36.7 C)   SpO2:  100%    General: Awake, no distress.  CV:  Good peripheral perfusion.  Regular rate and rhythm Resp:  Normal effort.  Clear to auscultation bilaterally Abd:  No distention.  Soft nontender Other:  Moist oral mucosa.  Patient exhibits expressive aphasia.  Motor function is intact, cerebellar function intact.   ED Results / Procedures / Treatments   Labs (all labs ordered are listed, but only abnormal results are displayed) Labs Reviewed  COMPREHENSIVE METABOLIC PANEL - Abnormal; Notable for the following components:      Result Value   Glucose, Bld 356 (*)    Calcium 8.8 (*)    Albumin 3.3 (*)    All other components within normal limits  ACETAMINOPHEN LEVEL -  Abnormal; Notable for the following components:   Acetaminophen (Tylenol), Serum <10 (*)    All other components within normal limits  SALICYLATE LEVEL - Abnormal; Notable for the following components:   Salicylate Lvl <7.0 (*)    All other components within normal limits  CBC WITH DIFFERENTIAL/PLATELET - Abnormal; Notable for the following components:   HCT 34.8 (*)    All other components within normal limits  CBG MONITORING, ED - Abnormal; Notable for the following components:   Glucose-Capillary 381 (*)    All other components within normal limits  ETHANOL  HCG, QUANTITATIVE, PREGNANCY  URINALYSIS, ROUTINE W REFLEX MICROSCOPIC  URINE DRUG SCREEN, QUALITATIVE ([facility] ONLY)  LIPID PANEL  TRIGLYCERIDES  CBC WITH DIFFERENTIAL/PLATELET  HEMOGLOBIN A1C  CBG MONITORING, ED  POC URINE PREG, ED     EKG Interpreted by me Normal sinus rhythm rate of 96.  Normal axis and intervals.  Normal QRS ST segments and T waves.   RADIOLOGY CT head viewed and interpreted by me which  shows 2 sizable hypodensities consistent with prior stroke.  Radiology report reviewed   PROCEDURES:  Procedures   MEDICATIONS ORDERED IN ED: Medications  sodium chloride 0.9 % bolus 1,000 mL (1,000 mLs Intravenous New Bag/Given 07/01/21 1930)     IMPRESSION / MDM / ASSESSMENT AND PLAN / ED COURSE  I reviewed the triage vital signs and the nursing notes.                              Differential diagnosis includes, but is not limited to, ischemic stroke, intracranial hemorrhage, intoxication, brain tumor, vasculitis/autoimmune disorder, electrolyte abnormality  Patient's presentation is most consistent with acute presentation with potential threat to life or bodily function.  Patient presents with altered mental status characterized primarily by aphasia.  Symptoms have been ongoing for 2 weeks, not a candidate for TNK or endovascular intervention of potential stroke.  CT head shows 2 areas of hypodensity,  described by radiology as sequelae of previous stroke, one of the likely subacute matching the 2 weeks symptoms course.  Patient is also found to have a blood sugar of 380 indicative of undiagnosed diabetes.  Also has severe hypertension, also up to now undiagnosed.  Patient also has elevated lipids.  She will need to be admitted for further stroke work-up and risk factor modification, PT and OT evaluation.  Case discussed with hospitalist.       FINAL CLINICAL IMPRESSION(S) / ED DIAGNOSES   Final diagnoses:  Cerebrovascular accident (CVA), unspecified mechanism (HCC)  New onset type 2 diabetes mellitus (HCC)     Rx / DC Orders   ED Discharge Orders     None        Note:  This document was prepared using Dragon voice recognition software and may include unintentional dictation errors.   Sharman Cheek, MD 07/02/21 [phone]

## 2021-07-01 NOTE — ED Notes (Signed)
This RN unable to get blood in triage. Room available so pt taken back to room and primary RN made aware this RN unable to get blood.

## 2021-07-01 NOTE — Assessment & Plan Note (Addendum)
-   This is new onset likely type 2 diabetes mellitus as a diagnosis, likely lack of health care access contributed to current state Long acting ordered with mealtime/correction Will adjust as needed DM education Self pay need CCM assistance

## 2021-07-01 NOTE — ED Notes (Signed)
Ultrasound at bedside

## 2021-07-02 ENCOUNTER — Observation Stay (HOSPITAL_COMMUNITY)
Admit: 2021-07-02 | Discharge: 2021-07-02 | Disposition: A | Discharge status: Home / Self Care | Payer: Managed Care, Other (non HMO) | Attending: Family Medicine | Admitting: Family Medicine

## 2021-07-02 DIAGNOSIS — G9389 Other specified disorders of brain: Secondary | ICD-10-CM | POA: Diagnosis present

## 2021-07-02 DIAGNOSIS — R29701 NIHSS score 1: Secondary | ICD-10-CM | POA: Diagnosis present

## 2021-07-02 DIAGNOSIS — E119 Type 2 diabetes mellitus without complications: Secondary | ICD-10-CM | POA: Diagnosis not present

## 2021-07-02 DIAGNOSIS — I63532 Cerebral infarction due to unspecified occlusion or stenosis of left posterior cerebral artery: Secondary | ICD-10-CM | POA: Diagnosis present

## 2021-07-02 DIAGNOSIS — I639 Cerebral infarction, unspecified: Secondary | ICD-10-CM | POA: Diagnosis present

## 2021-07-02 DIAGNOSIS — Z833 Family history of diabetes mellitus: Secondary | ICD-10-CM | POA: Diagnosis not present

## 2021-07-02 DIAGNOSIS — I10 Essential (primary) hypertension: Secondary | ICD-10-CM | POA: Diagnosis present

## 2021-07-02 DIAGNOSIS — I160 Hypertensive urgency: Secondary | ICD-10-CM | POA: Diagnosis present

## 2021-07-02 DIAGNOSIS — R4702 Dysphasia: Secondary | ICD-10-CM | POA: Diagnosis present

## 2021-07-02 DIAGNOSIS — E7800 Pure hypercholesterolemia, unspecified: Secondary | ICD-10-CM | POA: Diagnosis present

## 2021-07-02 DIAGNOSIS — E785 Hyperlipidemia, unspecified: Secondary | ICD-10-CM | POA: Diagnosis not present

## 2021-07-02 DIAGNOSIS — I6389 Other cerebral infarction: Secondary | ICD-10-CM

## 2021-07-02 DIAGNOSIS — Z8249 Family history of ischemic heart disease and other diseases of the circulatory system: Secondary | ICD-10-CM | POA: Diagnosis not present

## 2021-07-02 DIAGNOSIS — E1165 Type 2 diabetes mellitus with hyperglycemia: Secondary | ICD-10-CM | POA: Diagnosis present

## 2021-07-02 DIAGNOSIS — R001 Bradycardia, unspecified: Secondary | ICD-10-CM | POA: Diagnosis present

## 2021-07-02 DIAGNOSIS — R4182 Altered mental status, unspecified: Secondary | ICD-10-CM | POA: Diagnosis present

## 2021-07-02 LAB — HEMOGLOBIN A1C
Hgb A1c MFr Bld: 11.2 % — ABNORMAL HIGH (ref 4.8–5.6)
Mean Plasma Glucose: 274.74 mg/dL

## 2021-07-02 LAB — GLUCOSE, CAPILLARY
Glucose-Capillary: 327 mg/dL — ABNORMAL HIGH (ref 70–99)
Glucose-Capillary: 345 mg/dL — ABNORMAL HIGH (ref 70–99)
Glucose-Capillary: 125 mg/dL — ABNORMAL HIGH (ref 70–99)
Glucose-Capillary: 292 mg/dL — ABNORMAL HIGH (ref 70–99)

## 2021-07-02 LAB — BASIC METABOLIC PANEL
Anion gap: 7 (ref 5–15)
BUN: 6 mg/dL (ref 6–20)
CO2: 25 mmol/L (ref 22–32)
Calcium: 8.7 mg/dL — ABNORMAL LOW (ref 8.9–10.3)
Chloride: 107 mmol/L (ref 98–111)
Creatinine, Ser: 0.52 mg/dL (ref 0.44–1.00)
GFR, Estimated: >60 mL/min (ref >60)
Glucose, Bld: 331 mg/dL — ABNORMAL HIGH (ref 70–99)
Potassium: 3.8 mmol/L (ref 3.5–5.1)
Sodium: 139 mmol/L (ref 135–145)

## 2021-07-02 LAB — CBC
HCT: 34.2 % — ABNORMAL LOW (ref 36.0–46.0)
Hemoglobin: 12.1 g/dL (ref 12.0–15.0)
MCH: 28.9 pg (ref 26.0–34.0)
MCHC: 35.4 g/dL (ref 30.0–36.0)
MCV: 81.8 fL (ref 80.0–100.0)
Platelets: 289 10*3/uL (ref 150–400)
RBC: 4.18 MIL/uL (ref 3.87–5.11)
RDW: 12.4 % (ref 11.5–15.5)
WBC: 6.4 10*3/uL (ref 4.0–10.5)
nRBC: 0 % (ref 0.0–0.2)

## 2021-07-02 LAB — ECHOCARDIOGRAM COMPLETE BUBBLE STUDY
AR max vel: 1.74 cm2
AV Peak grad: 8.9 mmHg
Ao pk vel: 1.49 m/s
Area-P 1/2: 4.63 cm2
Calc EF: 70.3 %
S' Lateral: 2.63 cm
Single Plane A2C EF: 63.5 %
Single Plane A4C EF: 79.4 %

## 2021-07-02 LAB — LIPID PANEL
Cholesterol: 238 mg/dL — ABNORMAL HIGH (ref 0–200)
HDL: 37 mg/dL — ABNORMAL LOW (ref >40)
LDL Cholesterol: 169 mg/dL — ABNORMAL HIGH (ref 0–99)
Total CHOL/HDL Ratio: 6.4 RATIO
Triglycerides: 159 mg/dL — ABNORMAL HIGH (ref <150)
VLDL: 32 mg/dL (ref 0–40)

## 2021-07-02 LAB — HIV ANTIBODY (ROUTINE TESTING W REFLEX): HIV Screen 4th Generation wRfx: NONREACTIVE

## 2021-07-02 MED ORDER — LABETALOL HCL 5 MG/ML IV SOLN
10.0000 mg | INTRAVENOUS | Status: DC | PRN
  Administered 2021-07-03: 10 mg via INTRAVENOUS
  Filled 2021-07-02: qty 4

## 2021-07-02 MED ORDER — INSULIN ASPART 100 UNIT/ML IJ SOLN
0.0000 [IU] | Freq: Three times a day (TID) | INTRAMUSCULAR | Status: DC
  Administered 2021-07-02 – 2021-07-03 (×2): 7 [IU] via SUBCUTANEOUS
  Administered 2021-07-03 (×2): 5 [IU] via SUBCUTANEOUS
  Administered 2021-07-03: 2 [IU] via SUBCUTANEOUS
  Administered 2021-07-04 (×2): 5 [IU] via SUBCUTANEOUS
  Filled 2021-07-02 (×5): qty 1

## 2021-07-02 MED ORDER — INSULIN GLARGINE-YFGN 100 UNIT/ML ~~LOC~~ SOLN: 10.0000 [IU] | Freq: Every morning | SUBCUTANEOUS | Status: DC

## 2021-07-02 MED ORDER — LIVING WELL WITH DIABETES BOOK
Freq: Once | Status: AC
Start: 2021-07-02 — End: 2021-07-02
  Filled 2021-07-02: qty 1

## 2021-07-02 MED ORDER — ENSURE MAX PROTEIN PO LIQD
11.0000 [oz_av] | Freq: Two times a day (BID) | ORAL | Status: DC
  Administered 2021-07-02 – 2021-07-04 (×3): 11 [oz_av] via ORAL

## 2021-07-02 MED ORDER — INSULIN STARTER KIT- PEN NEEDLES (ENGLISH)
1.0000 | Freq: Once | Status: AC
Start: 2021-07-02 — End: 2021-07-02
  Administered 2021-07-02: 1
  Filled 2021-07-02: qty 1

## 2021-07-02 MED ORDER — INSULIN ASPART 100 UNIT/ML IJ SOLN
3.0000 [IU] | Freq: Three times a day (TID) | INTRAMUSCULAR | Status: DC
Start: 2021-07-02 — End: 2021-07-02

## 2021-07-02 MED ORDER — INSULIN GLARGINE-YFGN 100 UNIT/ML ~~LOC~~ SOLN
12.0000 [IU] | Freq: Every day | SUBCUTANEOUS | Status: DC
  Administered 2021-07-02: 12 [IU] via SUBCUTANEOUS
  Filled 2021-07-02: qty 0.12

## 2021-07-02 MED ORDER — INSULIN ASPART 100 UNIT/ML IJ SOLN
0.0000 [IU] | Freq: Three times a day (TID) | INTRAMUSCULAR | Status: DC
  Administered 2021-07-02: 2 [IU] via SUBCUTANEOUS
  Administered 2021-07-02: 8 [IU] via SUBCUTANEOUS
  Filled 2021-07-02 (×2): qty 1

## 2021-07-02 MED ORDER — INSULIN ASPART 100 UNIT/ML IJ SOLN
3.0000 [IU] | Freq: Three times a day (TID) | INTRAMUSCULAR | Status: DC
  Administered 2021-07-02 – 2021-07-03 (×3): 3 [IU] via SUBCUTANEOUS
  Filled 2021-07-02 (×2): qty 1

## 2021-07-02 MED ORDER — INSULIN GLARGINE-YFGN 100 UNIT/ML ~~LOC~~ SOLN: 8.0000 [IU] | Freq: Every morning | SUBCUTANEOUS | Status: DC

## 2021-07-02 MED ORDER — INSULIN ASPART 100 UNIT/ML IJ SOLN
5.0000 [IU] | Freq: Once | INTRAMUSCULAR | Status: AC
  Administered 2021-07-02: 5 [IU] via SUBCUTANEOUS
  Filled 2021-07-02: qty 1

## 2021-07-02 NOTE — Consult Note (Signed)
NEURO HOSPITALIST CONSULT NOTE   Requesting physician: Dr. Guss BundeKaul  Reason for Consult: Subacute left PCA territory ischemic infarction  History obtained from:  Patient and Chart     HPI:                                                                                                                                          Cheyenne Martinez is an 46 y.o. female with no significant PMHx who presented to the ED on Friday evening with c/c by family of a 2 week history of waxing and waning mentation, with confusion, forgetfulness and difficulty recognizing people. CBG in Triage was 381, despite no known history of DM. She was hypertensive with BP 198/107, tachycardic with HR 100 and tachypneic with RR 30.   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 Associated mild petechial blood products at the left occipital lobe without frank hemorrhagic transformation or significant mass effect. 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  Additional history from Hospitalist HPI has been reviewed: "46 y.o. African-American female with no chronic medical problems, who presented to the ER with acute onset of altered mental status and difficulty finding words with occasional slurred speech over the last couple of days.  She denied any headache or dizziness or blurred vision or diplopia.  She denies any tingling or numbness or focal muscle weakness over the her upper or lower extremities.  No tinnitus or vertigo.  No witnessed seizures.  No urinary or stool incontinence.  She denies any chest pain or palpitations.  No presyncope or syncope.  No nausea or vomiting or abdominal pain.  No cough or wheezing or dyspnea.  No dysuria, oliguria or hematuria or flank pain."  History reviewed. No pertinent past  medical history.  History reviewed. No pertinent surgical history.  History reviewed. No pertinent family history.         Social History:  reports that she has never smoked. She does not have any smokeless tobacco history on file. She reports that she does not drink alcohol and does not use drugs.  No Known Allergies  MEDICATIONS:  Prior to Admission:  Medications Prior to Admission  Medication Sig Dispense Refill Last Dose   cyclobenzaprine (FLEXERIL) 5 MG tablet Take 1 tablet (5 mg total) by mouth 3 (three) times daily as needed for muscle spasms. (Patient not taking: Reported on 07/01/2021) 30 tablet 0 Not Taking   Scheduled:  aspirin EC  81 mg Oral Daily   atorvastatin  20 mg Oral Daily   clopidogrel  75 mg Oral Daily   enoxaparin (LOVENOX) injection  40 mg Subcutaneous Q24H   insulin aspart  0-9 Units Subcutaneous TID AC & HS   insulin aspart  3 Units Subcutaneous TID WC   insulin glargine-yfgn  12 Units Subcutaneous QHS   lisinopril  5 mg Oral Daily   Ensure Max Protein  11 oz Oral BID    ROS:                                                                                                                                       As per HPI.   Blood pressure (!) 151/115, pulse 80, temperature 98.3 F (36.8 C), resp. rate 16, height 5\' 6"  (1.676 m), weight 74.3 kg, SpO2 100 %.   General Examination:                                                                                                       Physical Exam  HEENT-  [place]/AT    Lungs- Respirations unlabored Extremities- No edema   Neurological Examination Mental Status: Awake and alert. Speech is fluent when communicating with short phrases, but becomes dysfluent and perseverative with frequent semantic paraphasias when attempting to speak in full sentences. Naming and repetition impaired. Comprehension for  some commands is impaired. Partially oriented.  Cranial Nerves: II: Temporal visual fields intact with no extinction to DSS. PERRL.   III,IV, VI: No ptosis. EOMI without nystagmus.  V: Temp sensation equal bilaterally.  VII: Smile symmetric  VIII: Hearing intact to voice IX,X: No hypophonia XI: Symmetric shoulder shrug XII: Midline tongue extension Motor: RUE 5/5 proximally and distally RLE 5/5 proximally and distally LUE 5/5 proximally and distally LLE 5/5 proximally and distally No pronator drift Sensory: Temp and light touch sensation intact throughout, bilaterally Deep Tendon Reflexes: 2+ and symmetric throughout Cerebellar: No ataxia with FNF bilaterally  Gait: Deferred   Lab Results: Basic Metabolic Panel: Recent Labs  Lab 07/01/21 1852 07/02/21 0630  NA 135 139  K 3.6 3.8  CL 102 107  CO2 22  25  GLUCOSE 356* 331*  BUN 8 6  CREATININE 0.71 0.52  CALCIUM 8.8* 8.7*    CBC: Recent Labs  Lab 07/01/21 1852 07/01/21 1955 07/02/21 0630  WBC 6.3 7.1 6.4  NEUTROABS 3.9 4.3  --   HGB 12.0 12.7 12.1  HCT 34.8* 36.6 34.2*  MCV 82.5 83.0 81.8  PLT 272 330 289    Cardiac Enzymes: No results for input(s): CKTOTAL, CKMB, CKMBINDEX, TROPONINI in the last 168 hours.  Lipid Panel: Recent Labs  Lab 07/01/21 1852 07/02/21 0630  CHOL 263* 238*  TRIG 190*  188* 159*  HDL 36* 37*  CHOLHDL 7.3 6.4  VLDL 38 32  LDLCALC 189* 169*    Imaging: CT Head Wo Contrast  Result Date: 07/01/2021 CLINICAL DATA:  Mental status change, unknown cause EXAM: CT HEAD WITHOUT CONTRAST TECHNIQUE: Contiguous axial images were obtained from the base of the skull through the vertex without intravenous contrast. RADIATION DOSE REDUCTION: This exam was performed according to the departmental dose-optimization program which includes automated exposure control, adjustment of the mA and/or kV according to patient size and/or use of iterative reconstruction technique. COMPARISON:  None  Available. FINDINGS: Brain: Low-density changes within the left occipital lobe suggest sequela of a late subacute to chronic infarct. Encephalomalacia in the right cerebellar hemisphere compatible with remote insult. No intracranial hemorrhage. No hydrocephalus. No extra-axial collection. Vascular: No hyperdense vessel or unexpected calcification. Skull: Normal. Negative for fracture or focal lesion. Sinuses/Orbits: No acute finding. Other: None. IMPRESSION: 1. Low-density changes within the left occipital lobe suggest sequela of a late subacute to chronic infarct. Further evaluation with MRI is suggested. 2. Encephalomalacia in the right cerebellar hemisphere compatible with remote insult. Electronically Signed   By: Duanne Guess D.O.   On: 07/01/2021 19:05   MR Brain W and Wo Contrast  Result Date: 07/01/2021 CLINICAL DATA:  Initial evaluation for mental status change. EXAM: MRI HEAD WITHOUT AND WITH CONTRAST TECHNIQUE: Multiplanar, multiecho pulse sequences of the brain and surrounding structures were obtained without and with intravenous contrast. CONTRAST:  7mL GADAVIST GADOBUTROL 1 MMOL/ML IV SOLN COMPARISON:  Head CT from earlier the same day. FINDINGS: Brain: Cerebral volume within normal limits. Mild chronic microvascular ischemic disease noted within the supratentorial cerebral white matter. Chronic right cerebellar infarct noted. Evolving restricted diffusion involving the mesial left occipital lobe, consistent with an evolving early subacute left PCA distribution infarct. Patchy involvement of the left thalamus. Additional scattered small volume ischemic changes noted within the cortical and subcortical left frontotemporal region. Small patchy cortical infarct noted at the high right parietal lobe as well (series 5, image 37). Associated petechial hemorrhage at the left occipital lobe without frank hemorrhagic transformation or mass effect Heidelberg classification 1a: HI1, scattered small  petechiae, no mass effect. Scattered irregular enhancement about the areas of infarction and in the adjacent brain parenchyma, consistent with subacute ischemia and probably reactive change. Gray-white matter differentiation otherwise maintained. No mass lesion or midline shift. No hydrocephalus or extra-axial fluid collection. Pituitary gland suprasellar region within normal limits. No other abnormal enhancement. Vascular: Major intracranial vascular flow voids are maintained. Skull and upper cervical spine: Craniocervical junction within normal limits. Bone marrow signal intensity diffusely decreased on T1 weighted sequence, nonspecific, but most commonly related to anemia, smoking or obesity. No focal marrow replacing lesion. No scalp soft tissue abnormality. Sinuses/Orbits: Globes and orbital soft tissues within normal limits. Paranasal sinuses are largely clear. No significant mastoid effusion. Other: None. IMPRESSION: 1. Evolving early subacute  left PCA distribution infarct as above, with additional scattered small volume ischemic changes within the left frontotemporal and right parietal lobe as above. Associated mild petechial blood products at the left occipital lobe without frank hemorrhagic transformation or significant mass effect. 2. 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 Electronically Signed   By: Rise Mu M.D.   On: 07/01/2021 22:45   US Carotid Bilateral (at [facility] and [facility] only)  Result Date: 07/01/2021 CLINICAL DATA:  Infarct in the left parieto-occipital area on CT today with late-subacute appearance and more chronic appearing patchy infarct in the right cerebellar hemisphere, evaluate carotid disease. EXAM: BILATERAL CAROTID DUPLEX ULTRASOUND TECHNIQUE: Wallace Cullens scale imaging, color Doppler and duplex ultrasound were performed of bilateral carotid and vertebral arteries in the neck. COMPARISON:  No prior vascular study. Images of  today's head CT reviewed for correlation. FINDINGS: Criteria: Quantification of carotid stenosis is based on velocity parameters that correlate the residual internal carotid diameter with NASCET-based stenosis levels, using the diameter of the distal internal carotid lumen as the denominator for stenosis measurement. The following velocity measurements were obtained: RIGHT ICA: 91 cm/sec CCA: 60 cm/sec SYSTOLIC ICA/CCA RATIO:  1.5 ECA: 51 cm/sec LEFT ICA: 107 cm/sec CCA: 74 cm/sec SYSTOLIC ICA/CCA RATIO:  1.4 ECA: 91 cm/sec RIGHT CAROTID ARTERY: There is mild intimal thickening in the common carotid artery. There is an accumulation of soft plaque along a 2 cm length of the proximal to mid common carotid artery, but this only narrows the lumen no more than 20% and is essentially nonstenosing. There are no visible plaques in the carotid bifurcation, cervical ICA. RIGHT VERTEBRAL ARTERY: There is antegrade flow with low resistance waveform. LEFT CAROTID ARTERY: Mild intimal thickening is noted. No focal plaque is seen. LEFT VERTEBRAL ARTERY: There is antegrade flow with low resistance waveform. Upper extremity blood pressures: RIGHT: Information not provided by tech. LEFT: Information not provided by tech. IMPRESSION: No sonographic evidence of hemodynamically significant cervical carotid system stenosis. There is a short-length accumulation of soft plaque along posterior wall of the proximal to mid right CCA but the resulting vessel narrowing is no more than 20%. No other focal plaques are observed. There is mild intimal thickening of both common carotid arteries. Electronically Signed   By: Almira Bar M.D.   On: 07/01/2021 22:09     Assessment: 46 year old female with subacute left PCA territory ischemic infarction 1. Exam reveals expressive and receptive dysphasia without motor or sensory deficits.  2. CT head:  Low-density changes within the left occipital lobe suggest sequela of a late subacute to chronic  infarct. Further evaluation with MRI is suggested. Encephalomalacia in the right cerebellar hemisphere compatible with remote insult. 3. MRI brain reveals restricted diffusion and FLAIR hyperintensity in the mesial left occipital lobe, consistent with an evolving early subacute left PCA distribution infarct. Patchy involvement of the left thalamus. Additional scattered small volume ischemic changes noted within the cortical and subcortical left frontotemporal region. Small patchy cortical infarct noted at the high right parietal lobe as well (series 5, image 37). Associated petechial hemorrhage at the left occipital lobe. Scattered irregular enhancement about the areas of infarction and in the adjacent brain parenchyma, consistent with subacute ischemia and probably reactive change. Mild chronic microvascular ischemic disease noted within the supratentorial cerebral white matter. Chronic right cerebellar infarct also noted. 4. TTE: No intracardiac source of embolism detected on this transthoracic study.  5. Carotid ultrasound: No sonographic evidence of hemodynamically significant  cervical carotid system stenosis. There is a short-length accumulation of soft plaque along posterior wall of the proximal to mid right CCA but the resulting vessel narrowing is no more than 20%. No other focal plaques are observed. There is mild intimal thickening of both common carotid arteries. 6. Stroke risk factors: Probable underlying undiagnosed diabetes.   Recommendations: 1. HgbA1c, fasting lipid panel 2. Agree with starting ASA, Plavix and atorvastatin 3. PT consult, OT consult, Speech consult 4. Glycemic control 5. BP management.  6. Risk factor modification 7. Telemetry monitoring 8. CTA of head and neck 9. Frequent neuro checks 10. NPO until passes stroke swallow screen    Electronically signed: Dr. Caryl Pina 07/02/2021, 8:25 AM

## 2021-07-02 NOTE — Evaluation (Signed)
Speech Language Pathology Evaluation Patient Details Name: Cheyenne Martinez MRN: [id number] DOB: [birth year] Today's Date: 07/02/2021 Time: 0017-4944 SLP Time Calculation (min) (ACUTE ONLY): 38 min  Problem List:  Patient Active Problem List   Diagnosis Date Noted   CVA (cerebral vascular accident) (HCC) 07/01/2021   Uncontrolled type 2 diabetes mellitus with hyperglycemia, without long-term current use of insulin (HCC) 07/01/2021   Dyslipidemia 07/01/2021   Hypertensive urgency 07/01/2021   Past Medical History: History reviewed. No pertinent past medical history. Past Surgical History: History reviewed. No pertinent surgical history. HPI:  Per admitting H&P "Cheyenne Martinez is a 46 y.o. African-American female with no chronic medical problems, who presented to the ER with acute onset of altered mental status and difficulty finding words with occasional slurred speech over the last couple of days.  She denied any headache or dizziness or blurred vision or diplopia.  She denies any tingling or numbness or focal muscle weakness over the her upper or lower extremities.  No tinnitus or vertigo.  No witnessed seizures.  No urinary or stool incontinence.  She denies any chest pain or palpitations.  No presyncope or syncope.  No nausea or vomiting or abdominal pain.  No cough or wheezing or dyspnea.  No dysuria, oliguria or hematuria or flank pain."   Assessment / Plan / Recommendation Clinical Impression  this pleasant 46 YO lady presents with mild to moderate expressive and receptive Aphasia. Upon entering, Pt was eating lunch seated upright in bed,and presenting with no chewing or swallowing problems. Speech and language evaluation revealed expressive deficits with dysnomia moslty at the conversation level and some difficulty with confrontation naming as well. It is positive to note that she was able to communicate simple  needs and wants with clear speech and no evidence of Dysrathria. Receptive deficits  are characterized by difficulty with moderate to complex Y/N questions and difficulty with mulistep tasks. Repetiion at the word and phrase level was intact. Writing to dictation was also impaired at the word and phrase level. Pt would benefit from more formal speech and langauage assessment treatment at discharge and speech therapy treatment in thes setting as well as at discharge. She was pleasant and motivated to get better. She did become tearful at the end of our visit as we talked about her children. Good prognosis for improvement with speech therapy services.    SLP Assessment  SLP Recommendation/Assessment: Patient needs continued Speech Lanaguage Pathology Services SLP Visit Diagnosis: Aphasia (R47.01)    Recommendations for follow up therapy are one component of a multi-disciplinary discharge planning process, led by the attending physician.  Recommendations may be updated based on patient status, additional functional criteria and insurance authorization.    Follow Up Recommendations  Outpatient SLP    Assistance Recommended at Discharge  Intermittent Supervision/Assistance  Functional Status Assessment Patient has had a recent decline in their functional status and demonstrates the ability to make significant improvements in function in a reasonable and predictable amount of time.  Frequency and Duration min 3x week  4 weeks      SLP Evaluation Cognition  Overall Cognitive Status: Impaired/Different from baseline Arousal/Alertness: Awake/alert Orientation Level: Oriented to person;Oriented to place;Oriented to situation;Disoriented to time Year: Other (Comment) (2012) Month: January Attention: Focused Focused Attention: Appears intact Memory: Appears intact Awareness: Impaired Problem Solving:  (DNT but was unaware she was having tropuble with word retrieval when St asked)       Comprehension  Auditory Comprehension Overall Auditory Comprehension: Impaired Yes/No  Questions: Impaired Complex Questions: 25-49% accurate Commands: Impaired Two Step Basic Commands: 50-74% accurate Multistep Basic Commands: 50-74% accurate Conversation: Complex Interfering Components: Processing speed EffectiveTechniques: Extra processing time;Repetition Reading Comprehension Reading Status: Impaired Word level: Within functional limits Sentence Level: Impaired Paragraph Level: Not tested    Expression Verbal Expression Overall Verbal Expression: Impaired Initiation: No impairment Level of Generative/Spontaneous Verbalization: Conversation Repetition: No impairment Naming: Impairment Responsive: 51-75% accurate Confrontation: Impaired Convergent: 50-74% accurate Pragmatics: No impairment Effective Techniques: Sentence completion;Semantic cues;Open ended questions Written Expression Dominant Hand: Right Written Expression:  (note able to write to dictation but was able to write letters of her last name when spelled)   Oral / Motor  Oral Motor/Sensory Function Overall Oral Motor/Sensory Function: Within functional limits Motor Speech Overall Motor Speech: Appears within functional limits for tasks assessed Respiration: Within functional limits Phonation: Normal Resonance: Within functional limits Articulation: Within functional limitis Intelligibility: Intelligible Motor Planning: Witnin functional limits Motor Speech Errors: Not applicable            Eather Colas 07/02/2021, 1:23 PM

## 2021-07-02 NOTE — Plan of Care (Signed)
Nutrition Education Note   RD consulted for nutrition education regarding diabetes.   46 y/o female with h/o HTN who is admitted with CVA and new DM  Spoke with Cheyenne Martinez via phone. Cheyenne Martinez reports good appetite and oral intake at baseline and in hospital.   Lab Results  Component Value Date   HGBA1C 11.2 (H) 07/01/2021   RD provided "Nutrition and Type II Diabetes" handout from the Academy of Nutrition and Dietetics. Discussed different food groups and their effects on blood sugar, emphasizing carbohydrate-containing foods. Provided list of carbohydrates and recommended serving sizes of common foods.  Discussed importance of controlled and consistent carbohydrate intake throughout the day. Provided examples of ways to balance meals/snacks and encouraged intake of high-fiber, whole grain complex carbohydrates. Teach back method used.  Expect good compliance.  Body mass index is 26.44 kg/m. Cheyenne Martinez meets criteria for overweight based on current BMI.  Current diet order is CHO modifed, patient is consuming approximately 75% of meals at this time. Labs and medications reviewed. No further nutrition interventions warranted at this time. RD contact information provided. If additional nutrition issues arise, please re-consult RD.  Betsey Holiday MS, RD, LDN Please refer to [facility] for RD and/or RD on-call/weekend/after hours pager

## 2021-07-02 NOTE — Progress Notes (Signed)
Inpatient Diabetes Program Recommendations  AACE/ADA: New Consensus Statement on Inpatient Glycemic Control (2015)  Target Ranges:  Prepandial:   less than 140 mg/dL      Peak postprandial:   less than 180 mg/dL (1-2 hours)      Critically ill patients:  140 - 180 mg/dL   Lab Results  Component Value Date   GLUCAP 265 (H) 07/01/2021   HGBA1C 11.2 (H) 07/01/2021    Review of Glycemic Control  Diabetes history: New Onset Outpatient Diabetes medications: None Current orders for Inpatient glycemic control: Novolog correction 0-20 units qid  Inpatient Diabetes Program Recommendations:   -Semglee 15 units qd (0.2 units x 74.3 kg) -Novolog 3 units tid meal coverage if eats 50% -Decrease Novolog correction to 0-9 units tid + hs 0-5 units Secure chat to Dr. Alanda Amass  Attempted to call patient (DM coordinator on call for system working remotely) and line busy multiple times. Will attempt later. Ordered Living Well With diabetes booklet, starter kit, dietician consult. Will reattempt contact.  Thank you, Nani Gasser. Atlanta Pelto, RN, MSN, CDE  Diabetes Coordinator Inpatient Glycemic Control Team Team Pager [phone] (8am-5pm) 07/02/2021 8:02 AM

## 2021-07-02 NOTE — Evaluation (Signed)
Occupational Therapy Evaluation Patient Details Name: Cheyenne Martinez MRN: [id number] DOB: 10-10-1975 Today's Date: 07/02/2021   History of Present Illness Cheyenne Martinez is a 46 y.o. African-American female with no chronic medical problems, who presented to the ER with acute onset of altered mental status and difficulty finding words with occasional slurred speech over the last couple of days.  She denied any headache or dizziness or blurred vision or diplopia.  She denies any tingling or numbness or focal muscle weakness over the her upper or lower extremities.  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 Associated  mild petechial blood products at the left occipital lobe without  frank hemorrhagic transformation or significant mass effect.  Underlying mild chronic microvascular ischemic disease with  chronic right cerebellar infarct.   Clinical Impression   Pt seen for OT evaluation this date.  Pt lives at home with her mom and 2 sons in a one story home.  She was independent prior to admission with basic self care and IADLs including driving.  Pt presents with mild right sided incoordination skills which may be affecting her handwriting, manipulation of small objects and opening packages/containers.  She does have some expressive aphasia during session with difficulty stating the year, her kids names and with ordering from a menu over the phone.  Her BP is elevated today therefore we did not perform higher level ADL/IADL tasks (MD did clear her to have OT/PT evals).  Will assess these skills next session.  She would benefit from skilled OT services to maximize safety and independence in necessary daily tasks, she may benefit from OP OT upon discharge depending on progress.       Recommendations for follow up therapy are one component of a  multi-disciplinary discharge planning process, led by the attending physician.  Recommendations may be updated based on patient status, additional functional criteria and insurance authorization.   Follow Up Recommendations  Outpatient OT    Assistance Recommended at Discharge Frequent or constant Supervision/Assistance  Patient can return home with the following A little help with walking and/or transfers;Assistance with cooking/housework    Functional Status Assessment  Patient has had a recent decline in their functional status and demonstrates the ability to make significant improvements in function in a reasonable and predictable amount of time.  Equipment Recommendations       Recommendations for Other Services       Precautions / Restrictions Precautions Precautions: None Restrictions Weight Bearing Restrictions: No      Mobility Bed Mobility Overal bed mobility: Modified Independent                  Transfers                          Balance                                           ADL either performed or assessed with clinical judgement   ADL Overall ADL's : Needs assistance/impaired Eating/Feeding: Modified independent   Grooming: Modified independent   Upper Body Bathing: Modified independent Upper Body Bathing Details (indicate cue type and reason): per clinical judgement Lower Body Bathing: Modified independent Lower Body Bathing  Details (indicate cue type and reason): per clinical judgement Upper Body Dressing : Modified independent   Lower Body Dressing: Modified independent Lower Body Dressing Details (indicate cue type and reason): pt able to sit edge of bed and don shoes   Toilet Transfer Details (indicate cue type and reason): not tested this date due to increased blood pressure         Functional mobility during ADLs: Supervision/safety General ADL Comments: Pt able to complete basic self care tasks with  modified independent to supervision for safety.  Pt demonstrates some mild incoordination in her right UE which affects her handwriting, manipulation of small objects and may need further assessment for higher level IADL tasks.     Vision Baseline Vision/History: 0 No visual deficits       Perception     Praxis      Pertinent Vitals/Pain Pain Assessment Pain Assessment: No/denies pain     Hand Dominance Right   Extremity/Trunk Assessment Upper Extremity Assessment Upper Extremity Assessment: Generalized weakness   Lower Extremity Assessment Lower Extremity Assessment: Defer to PT evaluation   Cervical / Trunk Assessment Cervical / Trunk Assessment: Normal   Communication Communication Communication: Expressive difficulties   Cognition Arousal/Alertness: Awake/alert Behavior During Therapy: WFL for tasks assessed/performed Overall Cognitive Status: Impaired/Different from baseline Area of Impairment: Orientation, Awareness                 Orientation Level: Time, Situation             General Comments: Expressive aphasia, difficulties naming her kids, ordering from the lunch menu and stating the year     General Comments  functional mobility deferred due to increased blood pressure this date.    Exercises     Shoulder Instructions      Home Living Family/patient expects to be discharged to:: Private residence Living Arrangements: Children Available Help at Discharge: Family;Available PRN/intermittently Type of Home: House Home Access: Stairs to enter [employer] of Steps: 4 Entrance Stairs-Rails: None Home Layout: One level     Bathroom Shower/Tub: Walk-in Pensions consultant: Standard     Home Equipment: None          Prior Functioning/Environment Prior Level of Function : Independent/Modified Independent                        OT Problem List: Decreased strength;Decreased activity  tolerance;Decreased coordination      OT Treatment/Interventions: Therapeutic activities;Patient/family education;Neuromuscular education;DME and/or AE instruction;Therapeutic exercise;Self-care/ADL training;Balance training    OT Goals(Current goals can be found in the care plan section) Acute Rehab OT Goals Patient Stated Goal: to go home and be as independent as before OT Goal Formulation: With patient Time For Goal Achievement: 07/16/21 Potential to Achieve Goals: Good ADL Goals Pt Will Transfer to Toilet: with modified independence Additional ADL Goal #1: Pt will demonstrate understanding of home program for fine motor coordination skills with modified indpendence.  OT Frequency: Min 2X/week    Co-evaluation              AM-PAC OT "6 Clicks" Daily Activity     Outcome Measure Help from another person eating meals?: None Help from another person taking care of personal grooming?: None Help from another person toileting, which includes using toliet, bedpan, or urinal?: A Little Help from another person bathing (including washing, rinsing, drying)?: None Help from another person to put on and taking off regular upper body clothing?: None  Help from another person to put on and taking off regular lower body clothing?: None 6 Click Score: 23   End of Session    Activity Tolerance: Patient tolerated treatment well Patient left: in bed;with call bell/phone within reach  OT Visit Diagnosis: Muscle weakness (generalized) (M62.81);Other (comment) (lack of coordination)                Time: 4315-4008 OT Time Calculation (min): 20 min Charges:  OT General Charges $OT Visit: 1 Visit OT Evaluation $OT Eval Low Complexity: 1 Low  Cheyenne Martinez, OTR/L, CLT 07/02/2021, 12:40 PM

## 2021-07-02 NOTE — Evaluation (Signed)
Physical Therapy Evaluation Patient Details Name: Cheyenne Martinez MRN: [id number] DOB: 1975-06-11 Today's Date: 07/02/2021  History of Present Illness  Cheyenne Martinez is a 46 y.o. African-American female with no chronic medical problems, who presented to the ER with acute onset of altered mental status and difficulty finding words with occasional slurred speech over the last couple of days.  She denied any headache or dizziness or blurred vision or diplopia.  She denies any tingling or numbness or focal muscle weakness over the her upper or lower extremities.  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 Associated  mild petechial blood products at the left occipital lobe without  frank hemorrhagic transformation or significant mass effect.  Underlying mild chronic microvascular ischemic disease with  chronic right cerebellar infarct.    Clinical Impression  Pt with elevated BP (180/105), however MD cleared via securechat to continuing with therapy evaluation.  Pt received supine in bed upon arrival to room and agreeable to therapy.  Pt vitals were assessed BP found to be 172/116 upon arrival to the room.  Pt experiencing no symptoms indicative of elevated BP, so therapy proceeded.  Pt able to perform transfers and ambulation without CGA progressing to supervision.  Pt does still ambulate slightly slower, and with mild unsteadiness, therefore pt will be kept on caseload during hospital stay to challenge balance more.  Pt also with some cognitive complications and is unable to state birth year, and jumbles words during initial conversation.  Current discharge plans to outpatient physical therapy are appropriate at this time in order to address balance and assist with full return to PLOF.  Pt will continue to benefit from skilled therapy in order to  address deficits listed below.         Recommendations for follow up therapy are one component of a multi-disciplinary discharge planning process, led by the attending physician.  Recommendations may be updated based on patient status, additional functional criteria and insurance authorization.  Follow Up Recommendations Outpatient PT (To challenge balance to make sure.)    Assistance Recommended at Discharge None  Patient can return home with the following       Equipment Recommendations None recommended by PT  Recommendations for Other Services       Functional Status Assessment Patient has had a recent decline in their functional status and demonstrates the ability to make significant improvements in function in a reasonable and predictable amount of time.     Precautions / Restrictions Precautions Precautions: None Restrictions Weight Bearing Restrictions: No      Mobility  Bed Mobility Overal bed mobility: Modified Independent                  Transfers Overall transfer level: Modified independent Equipment used: None               General transfer comment: pt able to perform transfer to standing with good technique and no AD needed.    Ambulation/Gait Ambulation/Gait assistance: Modified independent (Device/Increase time) Gait Distance (Feet): 120 Feet Assistive device: None Gait Pattern/deviations: WFL(Within Functional Limits) Gait velocity: WNL     General Gait Details: safe with functional gait around the nursing pod.  Stairs            Wheelchair Mobility    Modified Rankin (Stroke Patients Only)  Balance Overall balance assessment: Modified Independent                                           Pertinent Vitals/Pain Pain Assessment Pain Assessment: No/denies pain    Home Living Family/patient expects to be discharged to:: Private residence Living Arrangements: Children Available Help at  Discharge: Family;Available PRN/intermittently Type of Home: House Home Access: Stairs to enter Entrance Stairs-Rails: None Entrance Stairs-Number of Steps: 4   Home Layout: One level Home Equipment: None      Prior Function Prior Level of Function : Independent/Modified Independent                     Hand Dominance   Dominant Hand: Right    Extremity/Trunk Assessment   Upper Extremity Assessment Upper Extremity Assessment: Generalized weakness    Lower Extremity Assessment Lower Extremity Assessment: Defer to PT evaluation    Cervical / Trunk Assessment Cervical / Trunk Assessment: Normal  Communication   Communication: Expressive difficulties  Cognition Arousal/Alertness: Awake/alert Behavior During Therapy: WFL for tasks assessed/performed Overall Cognitive Status: Impaired/Different from baseline Area of Impairment: Orientation, Awareness                 Orientation Level: Time, Situation (Pt unable to state why she is at hospital and does unable to correctly give her birth year.)                      General Comments General comments (skin integrity, edema, etc.): functional mobility deferred due to increased blood pressure this date.    Exercises     Assessment/Plan    PT Assessment Patient needs continued PT services  PT Problem List Decreased strength       PT Treatment Interventions Gait training;Therapeutic activities;Therapeutic exercise    PT Goals (Current goals can be found in the Care Plan section)  Acute Rehab PT Goals Patient Stated Goal: to go home. PT Goal Formulation: With patient Time For Goal Achievement: 07/16/21 Potential to Achieve Goals: Good    Frequency 7X/week     Co-evaluation               AM-PAC PT "6 Clicks" Mobility  Outcome Measure Help needed turning from your back to your side while in a flat bed without using bedrails?: None Help needed moving from lying on your back to sitting on  the side of a flat bed without using bedrails?: None Help needed moving to and from a bed to a chair (including a wheelchair)?: None Help needed standing up from a chair using your arms (e.g., wheelchair or bedside chair)?: None Help needed to walk in hospital room?: None Help needed climbing 3-5 steps with a railing? : None 6 Click Score: 24    End of Session Equipment Utilized During Treatment: Gait belt Activity Tolerance: Patient tolerated treatment well Patient left: in bed;with call bell/phone within reach Nurse Communication: Mobility status PT Visit Diagnosis: Muscle weakness (generalized) (M62.81)    Time: 8676-1950 PT Time Calculation (min) (ACUTE ONLY): 24 min   Charges:   PT Evaluation $PT Eval Low Complexity: 1 Low PT Treatments $Therapeutic Activity: 8-22 mins        Nolon Bussing, PT, DPT 07/02/21, 1:37 PM   Phineas Real 07/02/2021, 1:33 PM

## 2021-07-02 NOTE — Progress Notes (Signed)
*  PRELIMINARY RESULTS* Echocardiogram 2D Echocardiogram has been performed. A Saline Microcavitation (Bubble Study) was requested and completed.  Claretta Fraise 07/02/2021, 8:08 AM

## 2021-07-03 LAB — GLUCOSE, CAPILLARY
Glucose-Capillary: 280 mg/dL — ABNORMAL HIGH (ref 70–99)
Glucose-Capillary: 232 mg/dL — ABNORMAL HIGH (ref 70–99)
Glucose-Capillary: 237 mg/dL — ABNORMAL HIGH (ref 70–99)
Glucose-Capillary: 207 mg/dL — ABNORMAL HIGH (ref 70–99)
Glucose-Capillary: 147 mg/dL — ABNORMAL HIGH (ref 70–99)

## 2021-07-03 MED ORDER — LISINOPRIL 10 MG PO TABS
10.0000 mg | ORAL_TABLET | Freq: Every day | ORAL | Status: DC
  Administered 2021-07-03 – 2021-07-04 (×2): 10 mg via ORAL
  Filled 2021-07-03 (×2): qty 1

## 2021-07-03 MED ORDER — ATORVASTATIN CALCIUM 20 MG PO TABS
40.0000 mg | ORAL_TABLET | Freq: Every day | ORAL | Status: DC
  Administered 2021-07-03 – 2021-07-04 (×2): 40 mg via ORAL
  Filled 2021-07-03 (×2): qty 2

## 2021-07-03 MED ORDER — INSULIN ASPART 100 UNIT/ML IJ SOLN
6.0000 [IU] | Freq: Three times a day (TID) | INTRAMUSCULAR | Status: DC
  Administered 2021-07-03 – 2021-07-04 (×4): 6 [IU] via SUBCUTANEOUS
  Filled 2021-07-03 (×3): qty 1

## 2021-07-03 MED ORDER — METFORMIN HCL 500 MG PO TABS
500.0000 mg | ORAL_TABLET | Freq: Two times a day (BID) | ORAL | Status: DC
  Administered 2021-07-03 – 2021-07-04 (×3): 500 mg via ORAL
  Filled 2021-07-03 (×3): qty 1

## 2021-07-03 MED ORDER — INSULIN GLARGINE-YFGN 100 UNIT/ML ~~LOC~~ SOLN
18.0000 [IU] | Freq: Every day | SUBCUTANEOUS | Status: DC
  Administered 2021-07-03: 18 [IU] via SUBCUTANEOUS
  Filled 2021-07-03: qty 0.18

## 2021-07-03 MED ORDER — HYDROCHLOROTHIAZIDE 12.5 MG PO TABS
6.2500 mg | ORAL_TABLET | Freq: Every day | ORAL | Status: DC
  Administered 2021-07-03 – 2021-07-04 (×2): 6.25 mg via ORAL
  Filled 2021-07-03 (×2): qty 1

## 2021-07-03 NOTE — Hospital Course (Signed)
46 y.o. African-American female with no chronic medical problems, who presented to the ER with acute onset of altered mental status and difficulty finding words with occasional slurred speech over the last couple of days.  She denied any headache or dizziness or blurred vision or diplopia.  She denies any tingling or numbness or focal muscle weakness over the her upper or lower extremities.  No tinnitus or vertigo.  No witnessed seizures.  No urinary or stool incontinence.  She denies any chest pain or palpitations.  No presyncope or syncope.  No nausea or vomiting or abdominal pain.  No cough or wheezing or dyspnea.  No dysuria, oliguria or hematuria or flank pain.  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 Associated mild petechial blood products at the left occipital lobe without frank hemorrhagic transformation or significant mass effect. 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  Patient also has new diagnosis of hypertension and diabetes with A1c of 11.2.  Lipid profile with elevated cholesterol at 238, triglyceride 159, HDL 39 and LDL 169 with goal less than 70.   6/3 permissive HTN, awaiting neuro recs. Continue asa/plavix/statin Echo pending BG need tighter control, insulin naive.  6/4: CBG still elevated at 247. Echocardiogram was normal with negative bubble studies. Carotid ultrasound was negative for any significant stenosis. Speech is recommending outpatient speech therapy. Physical therapy and Occupational Therapy are also recommending outpatient.  Patient was started on lisinopril and HCTZ for hypertension.  Blood pressure within goal on discharge.  She was also started on metformin and Januvia.  Her blood glucose was managed with insulin while in the hospital.  She was placed  on DAPT for 1 month followed by aspirin only and also started on Lipitor.  Patient will need a close follow-up with PCP for further management of her hypertension and new diagnosis of diabetes.  She was counseled for her new diagnosis. She was also given glucose monitor and instructed to check her blood glucose level 3-4 times daily and bring that monitor to her PCP visit so they can make adjustments accordingly.  Patient will continue with current medication and follow-up with her providers.

## 2021-07-03 NOTE — Progress Notes (Signed)
Progress Note   Patient: Cheyenne Martinez VQM:[id number] DOB: 23-Mar-1975 DOA: 07/01/2021     1 DOS: the patient was seen and examined on 07/03/2021   Brief hospital course: 46 y.o. African-American female with no chronic medical problems, who presented to the ER with acute onset of altered mental status and difficulty finding words with occasional slurred speech over the last couple of days.  She denied any headache or dizziness or blurred vision or diplopia.  She denies any tingling or numbness or focal muscle weakness over the her upper or lower extremities.  No tinnitus or vertigo.  No witnessed seizures.  No urinary or stool incontinence.  She denies any chest pain or palpitations.  No presyncope or syncope.  No nausea or vomiting or abdominal pain.  No cough or wheezing or dyspnea.  No dysuria, oliguria or hematuria or flank pain.   6/3 permissive HTN, awaiting neuro recs. Continue asa/plavix/statin Echo pending BG need tighter control, insulin naive  Assessment and Plan: * CVA (cerebral vascular accident) [facility]) Inpatient status, CVA admission Continue NChecks MRI brain pending Asa/plavix/statin Echo pending - Neurology consult will be obtained as well as PT/OT and ST consults.   Uncontrolled type 2 diabetes mellitus with hyperglycemia, without long-term current use of insulin (HCC) - This is new onset likely type 2 diabetes mellitus as a diagnosis, likely lack of health care access contributed to current state Long acting ordered with mealtime/correction Will adjust as needed DM education Self pay need CCM assistance  Hypertensive urgency - This is obviously new onset hypertension is well. - Given new onset diabetes mellitus I will start her on ACE inhibitor therapy and place her on as needed IV labetalol.  Dyslipidemia - She likely has new onset/newly diagnosed dyslipidemia. - We will place the patient on statin therapy. - Though this is a nonfasting sample I will still start  statin special given her CVA, her LDL will need to be optimized.        Subjective:  NAEON, poor health literacy, memory issues ?cognitive delay. - BP slightly elevated but currently under permissive HTN  Physical Exam: Vitals:   07/03/21 0043 07/03/21 0114 07/03/21 0556 07/03/21 0757  BP: (!) 175/106 (!) 155/101 (!) 174/102 (!) 139/100  Pulse: 85  81 87  Resp: 18  18 18   Temp:    98.4 F (36.9 C)  TempSrc:      SpO2: 100%  100% 97%  Weight:      Height:        GENERAL:  46 y.o.-year-old African-American female patient lying in the bed with no acute distress.  EYES: Pupils equal, round, reactive to light and accommodation. No scleral icterus. Extraocular muscles intact.  HEENT: Head atraumatic, normocephalic. Oropharynx and nasopharynx clear.  NECK:  Supple, no jugular venous distention. No thyroid enlargement, no tenderness.  LUNGS: Normal breath sounds bilaterally, no wheezing, rales,rhonchi or crepitation. No use of accessory muscles of respiration.  CARDIOVASCULAR: Regular rate and rhythm, S1, S2 normal. No murmurs, rubs, or gallops.  ABDOMEN: Soft, nondistended, nontender. Bowel sounds present. No organomegaly or mass.  EXTREMITIES: No pedal edema, cyanosis, or clubbing.  NEUROLOGIC: Cranial nerves II through XII are intact except for persistent expressive dysphagia. Muscle strength 5/5 in all extremities. Sensation intact to light touch. Gait not checked.  Normal heel-to-shin test bilaterally.  Normal finger-to-nose however she had mild right dysmetria with finger to finger test. PSYCHIATRIC: The patient is alert and oriented x 3.  Normal affect and good eye contact.  SKIN: No obvious rash, lesion, or ulcer.  Data Reviewed:    Disposition: Status is: Inpatient Remains inpatient appropriate because: CVA, BP control BG control  Planned Discharge Destination: Home with Home Health    Time spent: 25 minutes  Author: Thomas Hoff, MD 07/03/2021 11:34 AM  For on call  review [email or web address].

## 2021-07-03 NOTE — Progress Notes (Signed)
Inpatient Diabetes Program Recommendations  AACE/ADA: New Consensus Statement on Inpatient Glycemic Control (2015)  Target Ranges:  Prepandial:   less than 140 mg/dL      Peak postprandial:   less than 180 mg/dL (1-2 hours)      Critically ill patients:  140 - 180 mg/dL   Lab Results  Component Value Date   GLUCAP 232 (H) 07/03/2021   HGBA1C 11.2 (H) 07/01/2021    Review of Glycemic Control  Inpatient Diabetes Program Recommendations:   Spoke with patient via phone(DM coordinator working remotely system wide call). Patient has been drinking regular sodas and higher carbohydrates @ home prior to admission, but willing to limit post discharge. Spoke with pt about new diagnosis. Discussed A1C results 11.2 (average blood glucose 275 over the past 2-3 months) and explained what an A1C is, basic pathophysiology of DM Type 2, basic home care, basic diabetes diet nutrition principles, importance of checking CBGs and maintaining good CBG control to prevent long-term and short-term complications. Reviewed signs and symptoms of hyperglycemia and hypoglycemia and how to treat hypoglycemia at home. Also reviewed blood sugar goals at home. Patient acknowledges understanding. RN teaching insulin pen to patient using insulin pen teaching station.  Thank you, Cheyenne Martinez. Cheyenne Smock, RN, MSN, CDE  Diabetes Coordinator Inpatient Glycemic Control Team Team Pager [phone] (8am-5pm) 07/03/2021 2:39 PM

## 2021-07-03 NOTE — Progress Notes (Signed)
Progress Note   Patient: Cheyenne Martinez GMW:[id number] DOB: 1975/09/19 DOA: 07/01/2021     1 DOS: the patient was seen and examined on 07/03/2021   Brief hospital course: 46 y.o. African-American female with no chronic medical problems, who presented to the ER with acute onset of altered mental status and difficulty finding words with occasional slurred speech over the last couple of days.  She denied any headache or dizziness or blurred vision or diplopia.  She denies any tingling or numbness or focal muscle weakness over the her upper or lower extremities.  No tinnitus or vertigo.  No witnessed seizures.  No urinary or stool incontinence.  She denies any chest pain or palpitations.  No presyncope or syncope.  No nausea or vomiting or abdominal pain.  No cough or wheezing or dyspnea.  No dysuria, oliguria or hematuria or flank pain.   6/3 permissive HTN, awaiting neuro recs. Continue asa/plavix/statin Echo pending BG need tighter control, insulin naive  Assessment and Plan: * CVA (cerebral vascular accident) [facility]) Inpatient status, CVA admission Continue NChecks MRI brain --Evolving early subacute left PCA distribution infarct as above, with additional scattered small volume ischemic changes within the left frontotemporal and right parietal lobe as above. Associated mild petechial blood products at the left occipital lobe without frank hemorrhagic transformation or significant mass effect. 2. 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 US Neck- There is a short-length accumulation of soft plaque along posterior wall of the proximal to mid right CCA but the resulting vessel narrowing is no more than 20%.  Neuro recommends CTA Head/Neck    Asa/plavix/statin Echo pending - Neurology consult will be obtained as well as PT/OT and ST consults.   Uncontrolled type 2 diabetes mellitus with hyperglycemia, without long-term current use of insulin  (HCC) - This is new onset likely type 2 diabetes mellitus as a diagnosis, likely lack of health care access contributed to current state Long acting ordered with mealtime/correction Will adjust as needed DM education Self pay need CCM assistance  Hypertensive urgency - This is obviously new onset hypertension is well. - Given new onset diabetes mellitus I will start her on ACE inhibitor therapy and place her on as needed IV labetalol.  Dyslipidemia - She likely has new onset/newly diagnosed dyslipidemia. - We will place the patient on statin therapy. - Though this is a nonfasting sample I will still start statin special given her CVA, her LDL will need to be optimized.        Subjective:  BP slightly better, still hypertensive Denies acute complaints, no palpitations/CP/SOB. Gait OK but balance somewhat unsteady Eager to go home. Spent time discussing her diagnosis, she states highest level of education is high school. Concern for cognitive delay but could be misrepresented as expressive aphasia/memory loss given acute CVA. Lives with mother who helps her. 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 Will need CCM med assistance.  Physical Exam: Vitals:   07/03/21 0114 07/03/21 0556 07/03/21 0757 07/03/21 1203  BP: (!) 155/101 (!) 174/102 (!) 139/100 (!) 151/104  Pulse:  81 87 90  Resp:  18 18 18   Temp:   98.4 F (36.9 C) 98.1 F (36.7 C)  TempSrc:    Oral  SpO2:  100% 97% 100%  Weight:      Height:        GENERAL:  46 y.o.-year-old African-American female patient lying in the bed with no acute distress.  EYES: Pupils equal, round, reactive to  light and accommodation. No scleral icterus. Extraocular muscles intact.  HEENT: Head atraumatic, normocephalic. Oropharynx and nasopharynx clear.  NECK:  Supple, no jugular venous distention. No thyroid enlargement, no tenderness.  LUNGS: Normal breath sounds bilaterally, no wheezing, rales,rhonchi or crepitation. No use of accessory  muscles of respiration.  CARDIOVASCULAR: Regular rate and rhythm, S1, S2 normal. No murmurs, rubs, or gallops.  ABDOMEN: Soft, nondistended, nontender. Bowel sounds present. No organomegaly or mass.  EXTREMITIES: No pedal edema, cyanosis, or clubbing.  NEUROLOGIC: Cranial nerves II through XII are intact except for persistent expressive dysphagia. Muscle strength 5/5 in all extremities. Sensation intact to light touch. Gait not checked.  Normal heel-to-shin test bilaterally.  Normal finger-to-nose however she had mild right dysmetria with finger to finger test. PSYCHIATRIC: The patient is alert and oriented x 3.  Normal affect and good eye contact. SKIN: No obvious rash, lesion, or ulcer.  Data Reviewed:    Disposition: Status is: Inpatient Remains inpatient appropriate because: CVA, BP control BG control  Planned Discharge Destination: Home with Home Health    Time spent: 25 minutes  Author: Thomas Hoff, MD 07/03/2021 2:34 PM  For on call review [email or web address].

## 2021-07-03 NOTE — Progress Notes (Signed)
Physical Therapy Treatment Patient Details Name: Cheyenne Martinez MRN: [id number] DOB: Mar 16, 1975 Today's Date: 07/03/2021   History of Present Illness Cheyenne Martinez is a 46 y.o. African-American female with no chronic medical problems, who presented to the ER with acute onset of altered mental status and difficulty finding words with occasional slurred speech over the last couple of days.  She denied any headache or dizziness or blurred vision or diplopia.  She denies any tingling or numbness or focal muscle weakness over the her upper or lower extremities.  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 Associated  mild petechial blood products at the left occipital lobe without  frank hemorrhagic transformation or significant mass effect.  Underlying mild chronic microvascular ischemic disease with  chronic right cerebellar infarct.    PT Comments    OOB and ambulated on unit without difficulty.  No questions or concerns voiced over mobility.   Recommendations for follow up therapy are one component of a multi-disciplinary discharge planning process, led by the attending physician.  Recommendations may be updated based on patient status, additional functional criteria and insurance authorization.  Follow Up Recommendations  Outpatient PT (To challenge balance to make sure.)     Assistance Recommended at Discharge None  Patient can return home with the following     Equipment Recommendations  None recommended by PT    Recommendations for Other Services       Precautions / Restrictions Precautions Precautions: None Restrictions Weight Bearing Restrictions: No     Mobility  Bed Mobility Overal bed mobility: Modified Independent                  Transfers Overall transfer level: Modified independent                       Ambulation/Gait Ambulation/Gait assistance: Modified independent (Device/Increase time) Gait Distance (Feet): 220 Feet Assistive device: None Gait Pattern/deviations: WFL(Within Functional Limits) Gait velocity: WNL     General Gait Details: some R foot drop but may be more attributed to footwear choice - croc type sandles/slides   Stairs             Wheelchair Mobility    Modified Rankin (Stroke Patients Only)       Balance Overall balance assessment: Modified Independent                                          Cognition Arousal/Alertness: Awake/alert Behavior During Therapy: WFL for tasks assessed/performed Overall Cognitive Status: Within Functional Limits for tasks assessed                                          Exercises      General Comments        Pertinent Vitals/Pain Pain Assessment Pain Assessment: No/denies pain    Home Living                          Prior Function            PT Goals (current goals can now be found in the  care plan section) Progress towards PT goals: Progressing toward goals    Frequency    7X/week      PT Plan      Co-evaluation              AM-PAC PT "6 Clicks" Mobility   Outcome Measure  Help needed turning from your back to your side while in a flat bed without using bedrails?: None Help needed moving from lying on your back to sitting on the side of a flat bed without using bedrails?: None Help needed moving to and from a bed to a chair (including a wheelchair)?: None Help needed standing up from a chair using your arms (e.g., wheelchair or bedside chair)?: None Help needed to walk in hospital room?: None Help needed climbing 3-5 steps with a railing? : None 6 Click Score: 24    End of Session Equipment Utilized During Treatment: Gait belt Activity Tolerance: Patient tolerated treatment well Patient left: in bed;with call  bell/phone within reach Nurse Communication: Mobility status PT Visit Diagnosis: Muscle weakness (generalized) (M62.81)     Time: 6203-5597 PT Time Calculation (min) (ACUTE ONLY): 9 min  Charges:  $Gait Training: 8-22 mins                   Danielle Dess, PTA 07/03/21, 9:23 AM

## 2021-07-04 ENCOUNTER — Inpatient Hospital Stay: Payer: Managed Care, Other (non HMO)

## 2021-07-04 ENCOUNTER — Encounter: Payer: Self-pay | Admitting: General Practice

## 2021-07-04 ENCOUNTER — Other Ambulatory Visit: Payer: Self-pay

## 2021-07-04 LAB — GLUCOSE, CAPILLARY
Glucose-Capillary: 247 mg/dL — ABNORMAL HIGH (ref 70–99)
Glucose-Capillary: 205 mg/dL — ABNORMAL HIGH (ref 70–99)

## 2021-07-04 MED ORDER — INSULIN GLARGINE-YFGN 100 UNIT/ML ~~LOC~~ SOLN
12.0000 [IU] | Freq: Two times a day (BID) | SUBCUTANEOUS | Status: DC
  Administered 2021-07-04: 12 [IU] via SUBCUTANEOUS
  Filled 2021-07-04 (×2): qty 0.12

## 2021-07-04 MED ORDER — ATORVASTATIN CALCIUM 40 MG PO TABS
40.0000 mg | ORAL_TABLET | Freq: Every day | ORAL | 1 refills | Status: AC
  Filled 2021-07-04: qty 90, 90d supply, fill #0

## 2021-07-04 MED ORDER — ASPIRIN 81 MG PO TBEC
81.0000 mg | DELAYED_RELEASE_TABLET | Freq: Every day | ORAL | 12 refills | Status: AC
  Filled 2021-07-04: qty 10, 10d supply, fill #0

## 2021-07-04 MED ORDER — METFORMIN HCL 500 MG PO TABS
500.0000 mg | ORAL_TABLET | Freq: Two times a day (BID) | ORAL | 2 refills | Status: AC
  Filled 2021-07-04: qty 60, 30d supply, fill #0

## 2021-07-04 MED ORDER — RIGHTEST GL300 LANCETS MISC
0 refills | Status: AC
  Filled 2021-07-04: qty 100, 25d supply, fill #0

## 2021-07-04 MED ORDER — GLUCOSE BLOOD VI STRP
ORAL_STRIP | 0 refills | Status: AC
  Filled 2021-07-04: qty 100, 25d supply, fill #0

## 2021-07-04 MED ORDER — BLOOD GLUCOSE MONITOR KIT
PACK | 0 refills | Status: AC
  Filled 2021-07-04: qty 1, 1d supply, fill #0

## 2021-07-04 MED ORDER — IOHEXOL 350 MG/ML SOLN
75.0000 mL | Freq: Once | INTRAVENOUS | Status: AC | PRN
  Administered 2021-07-04: 75 mL via INTRAVENOUS

## 2021-07-04 MED ORDER — LISINOPRIL 10 MG PO TABS
10.0000 mg | ORAL_TABLET | Freq: Every day | ORAL | 1 refills | Status: AC
  Filled 2021-07-04: qty 30, 30d supply, fill #0

## 2021-07-04 MED ORDER — CLOPIDOGREL BISULFATE 75 MG PO TABS
75.0000 mg | ORAL_TABLET | Freq: Every day | ORAL | 0 refills | Status: AC
  Filled 2021-07-04: qty 30, 30d supply, fill #0

## 2021-07-04 MED ORDER — SITAGLIPTIN PHOSPHATE 100 MG PO TABS
100.0000 mg | ORAL_TABLET | Freq: Every day | ORAL | 1 refills | Status: AC
  Filled 2021-07-04: qty 30, 30d supply, fill #0

## 2021-07-04 MED ORDER — HYDROCHLOROTHIAZIDE 12.5 MG PO TABS
6.2500 mg | ORAL_TABLET | Freq: Every day | ORAL | 2 refills | Status: AC
  Filled 2021-07-04: qty 30, 60d supply, fill #0

## 2021-07-04 NOTE — Discharge Summary (Signed)
Physician Discharge Summary   Patient: Cheyenne Martinez MRN: [id number] DOB: [birth year]  Admit date:     07/01/2021  Discharge date: 07/04/21  Discharge Physician: Lorella Nimrod   PCP: Pcp, No   Recommendations at discharge:  Please make sure that patient is compliant with her new medications. Obtain CBC and BMP in 1 week Follow-up with primary care provider within 1 week Follow-up with neurology  Discharge Diagnoses: Principal Problem:   CVA (cerebral vascular accident) [facility]) Active Problems:   New onset type 2 diabetes mellitus ([place])   Hypertensive urgency   Dyslipidemia  Hospital Course: 46 y.o. African-American female with no chronic medical problems, who presented to the ER with acute onset of altered mental status and difficulty finding words with occasional slurred speech over the last couple of days.  She denied any headache or dizziness or blurred vision or diplopia.  She denies any tingling or numbness or focal muscle weakness over the her upper or lower extremities.  No tinnitus or vertigo.  No witnessed seizures.  No urinary or stool incontinence.  She denies any chest pain or palpitations.  No presyncope or syncope.  No nausea or vomiting or abdominal pain.  No cough or wheezing or dyspnea.  No dysuria, oliguria or hematuria or flank pain.  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 Associated mild petechial blood products at the left occipital lobe without frank hemorrhagic transformation or significant mass effect. 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  Patient also has new diagnosis of hypertension and diabetes with A1c of 11.2.  Lipid profile with elevated cholesterol at 238, triglyceride 159, HDL 39 and LDL 169 with goal less than 70.   6/3 permissive HTN,  awaiting neuro recs. Continue asa/plavix/statin Echo pending BG need tighter control, insulin naive.  6/4: CBG still elevated at 247. Echocardiogram was normal with negative bubble studies. Carotid ultrasound was negative for any significant stenosis. Speech is recommending outpatient speech therapy. Physical therapy and Occupational Therapy are also recommending outpatient.  Patient was started on lisinopril and HCTZ for hypertension.  Blood pressure within goal on discharge.  She was also started on metformin and Januvia.  Her blood glucose was managed with insulin while in the hospital.  She was placed on DAPT for 1 month followed by aspirin only and also started on Lipitor.  Patient will need a close follow-up with PCP for further management of her hypertension and new diagnosis of diabetes.  She was counseled for her new diagnosis. She was also given glucose monitor and instructed to check her blood glucose level 3-4 times daily and bring that monitor to her PCP visit so they can make adjustments accordingly.  Patient will continue with current medication and follow-up with her providers.  Assessment and Plan: * CVA (cerebral vascular accident) [facility]) Inpatient status, CVA admission Continue NChecks MRI brain --Evolving early subacute left PCA distribution infarct as above, with additional scattered small volume ischemic changes within the left frontotemporal and right parietal lobe as above. Associated mild petechial blood products at the left occipital lobe without frank hemorrhagic transformation or significant mass effect. 2. 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 US Neck- There is a short-length accumulation of soft plaque along posterior wall of the proximal to mid right CCA but the resulting vessel narrowing is no more than 20%.  Neuro recommends CTA Head/Neck    Asa/plavix/statin Echo pending - Neurology consult  will be obtained as well as PT/OT and ST consults.   New onset type 2 diabetes mellitus ([place]) - This is new onset likely type 2 diabetes mellitus as a diagnosis, likely lack of health care access contributed to current state Long acting ordered with mealtime/correction Will adjust as needed DM education Self pay need CCM assistance  Hypertensive urgency - This is obviously new onset hypertension is well. - Given new onset diabetes mellitus I will start her on ACE inhibitor therapy and place her on as needed IV labetalol.  Dyslipidemia - She likely has new onset/newly diagnosed dyslipidemia. - We will place the patient on statin therapy. - Though this is a nonfasting sample I will still start statin special given her CVA, her LDL will need to be optimized.   Consultants: Neurology Procedures performed: None Disposition: Home Diet recommendation:  Discharge Diet Orders (From admission, onward)     Start     Ordered   07/04/21 0000  Diet - low sodium heart healthy        07/04/21 1015           Cardiac and Carb modified diet DISCHARGE MEDICATION: Allergies as of 07/04/2021   No Known Allergies      Medication List     STOP taking these medications    cyclobenzaprine 5 MG tablet Commonly known as: Flexeril       TAKE these medications    aspirin EC 81 MG tablet Take 1 tablet (81 mg total) by mouth daily. Swallow whole.   atorvastatin 40 MG tablet Commonly known as: LIPITOR Take 1 tablet (40 mg total) by mouth daily.   blood glucose meter kit and supplies Dispense based on patient and insurance preference. Use up to four times daily as directed. (FOR ICD-10 E10.9, E11.9).   clopidogrel 75 MG tablet Commonly known as: PLAVIX Take 1 tablet (75 mg total) by mouth daily.   hydrochlorothiazide 12.5 MG tablet Commonly known as: HYDRODIURIL Take 0.5 tablets (6.25 mg total) by mouth daily.   lisinopril 10 MG tablet Commonly known as: ZESTRIL Take 1 tablet  (10 mg total) by mouth daily.   metFORMIN 500 MG tablet Commonly known as: GLUCOPHAGE Take 1 tablet (500 mg total) by mouth 2 (two) times daily with a meal.   sitaGLIPtin 100 MG tablet Commonly known as: Januvia Take 1 tablet (100 mg total) by mouth daily.        Follow-up Information     Vladimir Crofts, MD. Schedule an appointment as soon as possible for a visit in 1 week(s).   Specialty: Neurology Contact information: [place] [facility] [place] [address] [phone]                Discharge Exam: Danley Danker Weights   07/01/21 2227  Weight: 74.3 kg   General.     In no acute distress. Pulmonary.  Lungs clear bilaterally, normal respiratory effort. CV.  Regular rate and rhythm, no JVD, rub or murmur. Abdomen.  Soft, nontender, nondistended, BS positive. CNS.  Alert and oriented .  No focal neurologic deficit. Extremities.  No edema, no cyanosis, pulses intact and symmetrical. Psychiatry.  Judgment and insight appears normal.   Condition at discharge: stable  The results of significant diagnostics from this hospitalization (including imaging, microbiology, ancillary and laboratory) are listed below for reference.   Imaging Studies: CT Head Wo Contrast  Result Date:  07/01/2021 CLINICAL DATA:  Mental status change, unknown cause EXAM: CT HEAD WITHOUT CONTRAST TECHNIQUE: Contiguous axial images were obtained from the base of the skull through the vertex without intravenous contrast. RADIATION DOSE REDUCTION: This exam was performed according to the departmental dose-optimization program which includes automated exposure control, adjustment of the mA and/or kV according to patient size and/or use of iterative reconstruction technique. COMPARISON:  None Available. FINDINGS: Brain: Low-density changes within the left occipital lobe suggest sequela of a late subacute to chronic infarct. Encephalomalacia in the right cerebellar hemisphere  compatible with remote insult. No intracranial hemorrhage. No hydrocephalus. No extra-axial collection. Vascular: No hyperdense vessel or unexpected calcification. Skull: Normal. Negative for fracture or focal lesion. Sinuses/Orbits: No acute finding. Other: None. IMPRESSION: 1. Low-density changes within the left occipital lobe suggest sequela of a late subacute to chronic infarct. Further evaluation with MRI is suggested. 2. Encephalomalacia in the right cerebellar hemisphere compatible with remote insult. Electronically Signed   By: Davina Poke D.O.   On: 07/01/2021 19:05   MR Brain W and Wo Contrast  Result Date: 07/01/2021 CLINICAL DATA:  Initial evaluation for mental status change. EXAM: MRI HEAD WITHOUT AND WITH CONTRAST TECHNIQUE: Multiplanar, multiecho pulse sequences of the brain and surrounding structures were obtained without and with intravenous contrast. CONTRAST:  35mL GADAVIST GADOBUTROL 1 MMOL/ML IV SOLN COMPARISON:  Head CT from earlier the same day. FINDINGS: Brain: Cerebral volume within normal limits. Mild chronic microvascular ischemic disease noted within the supratentorial cerebral white matter. Chronic right cerebellar infarct noted. Evolving restricted diffusion involving the mesial left occipital lobe, consistent with an evolving early subacute left PCA distribution infarct. Patchy involvement of the left thalamus. Additional scattered small volume ischemic changes noted within the cortical and subcortical left frontotemporal region. Small patchy cortical infarct noted at the high right parietal lobe as well (series 5, image 37). Associated petechial hemorrhage at the left occipital lobe without frank hemorrhagic transformation or mass effect Heidelberg classification 1a: HI1, scattered small petechiae, no mass effect. Scattered irregular enhancement about the areas of infarction and in the adjacent brain parenchyma, consistent with subacute ischemia and probably reactive change.  Gray-white matter differentiation otherwise maintained. No mass lesion or midline shift. No hydrocephalus or extra-axial fluid collection. Pituitary gland suprasellar region within normal limits. No other abnormal enhancement. Vascular: Major intracranial vascular flow voids are maintained. Skull and upper cervical spine: Craniocervical junction within normal limits. Bone marrow signal intensity diffusely decreased on T1 weighted sequence, nonspecific, but most commonly related to anemia, smoking or obesity. No focal marrow replacing lesion. No scalp soft tissue abnormality. Sinuses/Orbits: Globes and orbital soft tissues within normal limits. Paranasal sinuses are largely clear. No significant mastoid effusion. Other: None. IMPRESSION: 1. Evolving early subacute left PCA distribution infarct as above, with additional scattered small volume ischemic changes within the left frontotemporal and right parietal lobe as above. Associated mild petechial blood products at the left occipital lobe without frank hemorrhagic transformation or significant mass effect. 2. 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 Electronically Signed   By: Jeannine Boga M.D.   On: 07/01/2021 22:45   US Carotid Bilateral (at [facility] and [facility] only)  Result Date: 07/01/2021 CLINICAL DATA:  Infarct in the left parieto-occipital area on CT today with late-subacute appearance and more chronic appearing patchy infarct in the right cerebellar hemisphere, evaluate carotid disease. EXAM: BILATERAL CAROTID DUPLEX ULTRASOUND TECHNIQUE: Pearline Cables scale imaging, color Doppler and duplex ultrasound were performed of bilateral carotid  and vertebral arteries in the neck. COMPARISON:  No prior vascular study. Images of today's head CT reviewed for correlation. FINDINGS: Criteria: Quantification of carotid stenosis is based on velocity parameters that correlate the residual internal carotid diameter with  NASCET-based stenosis levels, using the diameter of the distal internal carotid lumen as the denominator for stenosis measurement. The following velocity measurements were obtained: RIGHT ICA: 91 cm/sec CCA: 60 cm/sec SYSTOLIC ICA/CCA RATIO:  1.5 ECA: 51 cm/sec LEFT ICA: 107 cm/sec CCA: 74 cm/sec SYSTOLIC ICA/CCA RATIO:  1.4 ECA: 91 cm/sec RIGHT CAROTID ARTERY: There is mild intimal thickening in the common carotid artery. There is an accumulation of soft plaque along a 2 cm length of the proximal to mid common carotid artery, but this only narrows the lumen no more than 20% and is essentially nonstenosing. There are no visible plaques in the carotid bifurcation, cervical ICA. RIGHT VERTEBRAL ARTERY: There is antegrade flow with low resistance waveform. LEFT CAROTID ARTERY: Mild intimal thickening is noted. No focal plaque is seen. LEFT VERTEBRAL ARTERY: There is antegrade flow with low resistance waveform. Upper extremity blood pressures: RIGHT: Information not provided by tech. LEFT: Information not provided by tech. IMPRESSION: No sonographic evidence of hemodynamically significant cervical carotid system stenosis. There is a short-length accumulation of soft plaque along posterior wall of the proximal to mid right CCA but the resulting vessel narrowing is no more than 20%. No other focal plaques are observed. There is mild intimal thickening of both common carotid arteries. Electronically Signed   By: Telford Nab M.D.   On: 07/01/2021 22:09   ECHOCARDIOGRAM COMPLETE BUBBLE STUDY  Result Date: 07/02/2021    ECHOCARDIOGRAM REPORT   Patient Name:   Cheyenne Martinez Date of Exam: 07/02/2021 Medical Rec #:  [id number]    Height:       66.0 in Accession #:    [id number]   Weight:       163.8 lb Date of Birth:  Mar 12, 1975    BSA:          1.837 m Patient Age:    72 years     BP:           151/115 mmHg Patient Gender: F            HR:           76 bpm. Exam Location:  [facility] Procedure: 2D Echo and Saline Contrast Bubble  Study Indications:     Stroke I63.9  History:         Patient has no prior history of Echocardiogram examinations.  Sonographer:     Kathlen Brunswick RDCS Referring Phys:  [id number] Mary Sella A MANSY Diagnosing Phys: Eleonore Chiquito MD IMPRESSIONS  1. Left ventricular ejection fraction, by estimation, is 60 to 65%. The left ventricle has normal function. The left ventricle has no regional wall motion abnormalities. There is mild concentric left ventricular hypertrophy. Left ventricular diastolic parameters were normal.  2. Right ventricular systolic function is normal. The right ventricular size is normal.  3. The mitral valve is grossly normal. Trivial mitral valve regurgitation. No evidence of mitral stenosis.  4. The aortic valve is tricuspid. Aortic valve regurgitation is not visualized. No aortic stenosis is present.  5. The inferior vena cava is normal in size with greater than 50% respiratory variability, suggesting right atrial pressure of 3 mmHg.  6. Agitated saline contrast bubble study was negative, with no evidence of any interatrial shunt. Conclusion(s)/Recommendation(s): No intracardiac source of embolism detected  on this transthoracic study. Consider a transesophageal echocardiogram to exclude cardiac source of embolism if clinically indicated. FINDINGS  Left Ventricle: Left ventricular ejection fraction, by estimation, is 60 to 65%. The left ventricle has normal function. The left ventricle has no regional wall motion abnormalities. The left ventricular internal cavity size was normal in size. There is  mild concentric left ventricular hypertrophy. Left ventricular diastolic parameters were normal. Right Ventricle: The right ventricular size is normal. No increase in right ventricular wall thickness. Right ventricular systolic function is normal. Left Atrium: Left atrial size was normal in size. Right Atrium: Right atrial size was normal in size. Pericardium: There is no evidence of pericardial effusion.  Mitral Valve: The mitral valve is grossly normal. Trivial mitral valve regurgitation. No evidence of mitral valve stenosis. Tricuspid Valve: The tricuspid valve is grossly normal. Tricuspid valve regurgitation is trivial. No evidence of tricuspid stenosis. Aortic Valve: The aortic valve is tricuspid. Aortic valve regurgitation is not visualized. No aortic stenosis is present. Aortic valve peak gradient measures 8.9 mmHg. Pulmonic Valve: The pulmonic valve was grossly normal. Pulmonic valve regurgitation is not visualized. No evidence of pulmonic stenosis. Aorta: The aortic root and ascending aorta are structurally normal, with no evidence of dilitation. Venous: The right lower pulmonary vein is normal. The inferior vena cava is normal in size with greater than 50% respiratory variability, suggesting right atrial pressure of 3 mmHg. IAS/Shunts: The atrial septum is grossly normal. Agitated saline contrast was given intravenously to evaluate for intracardiac shunting. Agitated saline contrast bubble study was negative, with no evidence of any interatrial shunt.  LEFT VENTRICLE PLAX 2D LVIDd:         3.91 cm     Diastology LVIDs:         2.63 cm     LV e' medial:    6.09 cm/s LV PW:         1.23 cm     LV E/e' medial:  12.2 LV IVS:        1.28 cm     LV e' lateral:   7.62 cm/s LVOT diam:     1.80 cm     LV E/e' lateral: 9.7 LV SV:         52 LV SV Index:   29 LVOT Area:     2.54 cm  LV Volumes (MOD) LV vol d, MOD A2C: 59.1 ml LV vol d, MOD A4C: 91.4 ml LV vol s, MOD A2C: 21.6 ml LV vol s, MOD A4C: 18.8 ml LV SV MOD A2C:     37.5 ml LV SV MOD A4C:     91.4 ml LV SV MOD BP:      52.2 ml RIGHT VENTRICLE RV Basal diam:  3.35 cm RV S prime:     12.70 cm/s TAPSE (M-mode): 2.4 cm LEFT ATRIUM             Index        RIGHT ATRIUM           Index LA diam:        2.90 cm 1.58 cm/m   RA Area:     11.10 cm LA Vol (A2C):   32.6 ml 17.75 ml/m  RA Volume:   25.50 ml  13.88 ml/m LA Vol (A4C):   29.0 ml 15.79 ml/m LA Biplane Vol:  32.1 ml 17.47 ml/m  AORTIC VALVE                 PULMONIC  VALVE AV Area (Vmax): 1.74 cm     PV Vmax:       1.03 m/s AV Vmax:        149.00 cm/s  PV Peak grad:  4.2 mmHg AV Peak Grad:   8.9 mmHg LVOT Vmax:      102.00 cm/s LVOT Vmean:     64.900 cm/s LVOT VTI:       0.206 m  AORTA Ao Root diam: 3.40 cm Ao Asc diam:  2.80 cm MITRAL VALVE MV Area (PHT): 4.63 cm    SHUNTS MV Decel Time: 164 msec    Systemic VTI:  0.21 m MV E velocity: 74.10 cm/s  Systemic Diam: 1.80 cm MV A velocity: 74.60 cm/s MV E/A ratio:  0.99 Eleonore Chiquito MD Electronically signed by Eleonore Chiquito MD Signature Date/Time: 07/02/2021/4:14:37 PM    Final     Microbiology: No results found for this or any previous visit.  Labs: CBC: Recent Labs  Lab 07/01/21 1852 07/01/21 1955 07/02/21 0630  WBC 6.3 7.1 6.4  NEUTROABS 3.9 4.3  --   HGB 12.0 12.7 12.1  HCT 34.8* 36.6 34.2*  MCV 82.5 83.0 81.8  PLT 272 330 657   Basic Metabolic Panel: Recent Labs  Lab 07/01/21 1852 07/02/21 0630  NA 135 139  K 3.6 3.8  CL 102 107  CO2 22 25  GLUCOSE 356* 331*  BUN 8 6  CREATININE 0.71 0.52  CALCIUM 8.8* 8.7*   Liver Function Tests: Recent Labs  Lab 07/01/21 1852  AST 15  ALT 8  ALKPHOS 71  BILITOT 0.5  PROT 7.3  ALBUMIN 3.3*   CBG: Recent Labs  Lab 07/03/21 1201 07/03/21 1532 07/03/21 1640 07/03/21 2120 07/04/21 0735  GLUCAP 232* 237* 207* 147* 247*    Discharge time spent: greater than 30 minutes.  This record has been created using Systems analyst. Errors have been sought and corrected,but may not always be located. Such creation errors do not reflect on the standard of care.   Signed: Lorella Nimrod, MD [facility] 07/04/2021

## 2021-07-04 NOTE — Therapy (Signed)
Occupational Therapy * Physical Therapy * Speech Therapy          DATE ___05 Jun 2023 PATIENT NAME____[name] PATIENT MRN_____[id number]_______________  DIAGNOSIS/DIAGNOSIS CODE ____   ICD-10-CM Priority Class Noted POA   CVA (cerebral vascular accident) ([facility]) I63.9     __________________ DATE OF DISCHARGE: ____05 Jun 2023 __________  PRIMARY CARE PHYSICIAN __________________________ PCP PHONE/FAX___________________________     Dear Provider (Name: __________________   Fax: ___________________________):   I certify that I have examined this patient and that occupational/physical/speech therapy is necessary on an outpatient basis.    The patient has expressed interest in completing their recommended course of therapy at your location.  Once a formal order from the patient's primary care physician has been obtained, please contact him/her to schedule an appointment for evaluation at your earliest convenience.   [ x ]  Physical Therapy Evaluate and Treat          [  x]  Occupational Therapy Evaluate and Treat                                    [  ]  Speech Therapy Evaluate and Treat       The patient's primary care physician (listed above) must furnish and be responsible for a formal order such that the recommended services may be furnished while under the primary physician's care, and that the plan of care will be established and reviewed every 30 days (or more often if condition necessitates).

## 2021-07-04 NOTE — Progress Notes (Addendum)
Inpatient Diabetes Program Recommendations  AACE/ADA: New Consensus Statement on Inpatient Glycemic Control   Target Ranges:  Prepandial:   less than 140 mg/dL      Peak postprandial:   less than 180 mg/dL (1-2 hours)      Critically ill patients:  140 - 180 mg/dL    Latest Reference Range & Units 07/03/21 07:58 07/03/21 12:01 07/03/21 15:32 07/03/21 16:40 07/03/21 21:20  Glucose-Capillary 70 - 99 mg/dL 280 (H) 232 (H) 237 (H) 207 (H) 147 (H)    Latest Reference Range & Units 07/01/21 19:55  Hemoglobin A1C 4.8 - 5.6 % 11.2 (H)   Review of Glycemic Control  Diabetes history: No Outpatient Diabetes medications: NA Current orders for Inpatient glycemic control: Semglee 18 units QHS, Novolog 6 units TID with meals, Novolog 0-9 units AC&HS, Metformin 500 mg BID  Inpatient Diabetes Program Recommendations:    HbgA1C: A1C 11.2% on 07/01/21 indicating an average glucose of 275 mg/dl over the past 2-3 months. Patient newly dx with DM this admission. Patient prefers to use vial/syringe for insulin injections. Patient should be getting medication from Medication Management clinic at discharge and they have Lantus and Humalog insulins available in vials.  At time of discharge please provide Rx for insulin vials (Lantus #30080; Humalog #[id number]), and insulin syringes (#[id number]).   NOTE: Inpatient diabetes coordinator spoke with patient over the phone on 07/03/21. Will follow up with patient today.  Addendum 07/04/21_0 :40-Spoke with patient at bedside about new diabetes diagnosis. Patient states she has not seen a provider in several years nor had any blood work in several years. Patient reports no prior DM dx or hx. Patient has Living Well with DM book at bedside and has started looking through book and nursing staff has started working with her on insulin injections. Patient states that she has NO insurance or PCP. Inquired about [place] insurance listed in chart and patient states she does not have any insurance.  Ordered TOC consult for assistance with arranging follow up and medication assistance. Discussed A1C results (11.2% on 07/01/21) and explained what an A1C is and informed patient that current A1C indicates an average glucose of 275 mg/dl over the past 2-3 months. Discussed basic pathophysiology of DM Type 2, basic home care, importance of checking CBGs and maintaining good CBG control to prevent long-term and short-term complications. Reviewed glucose and A1C goals. Reviewed signs and symptoms of hyperglycemia and hypoglycemia along with treatment for both. Discussed impact of nutrition, exercise, stress, sickness, and medications on diabetes control. Reviewed Living Well with diabetes booklet and encouraged patient to read through entire book. Informed patient that she will likely be prescribed insulin outpatient for DM control. Patient has given herself a few shots and she feels comfortable with giving them to herself. Asked patient to check glucose 3-4 times per day. Will ask TOC to provide glucose monitoring kit.  Explained how the doctor she follows up with can use the log book to continue to make insulin adjustments if needed. Reviewed and demonstrated how to draw up and administer insulin with vial and syringe and how to use an insulin pen. Patient states she would prefer to use vial/syringe.   Patient was able to successfully demonstrate how to draw up and administer insulin with vial and syringe. Informed patient that RN will be asking her to self-administer insulin to ensure proper technique and ability to administer self insulin shots.   Patient verbalized understanding of information discussed and she states that she has no further questions  at this time related to diabetes.   RNs to provide ongoing basic DM education at bedside with this patient and engage patient to actively check blood glucose and administer insulin injections.    Thanks, Barnie Alderman, RN, MSN, [place] Diabetes Coordinator Inpatient  Diabetes Program [phone] (Team Pager from 8am to Benjamin Perez)

## 2021-07-04 NOTE — TOC Initial Note (Signed)
Transition of Care [facility]) - Initial/Assessment Note    Patient Details  Name: Cheyenne Martinez MRN: [id number] Date of Birth: [birth year]  [facility]) CM/SW Contact:    Pete Pelt, RN Phone Number: 07/04/2021, 10:30 AM  Clinical Narrative:       Patient lives at home,and fees safe returning home at this time.  She states. no concerns with transportation at this time.  Medications going tomedication management.  Patient has no insurance and no PCP.  Resources for [facility] Crumble drew and [facility] provided to patient with intake application attached.  Outpatient PT recommended, paperwork complete, awaiting signing by md            Expected Discharge Plan: OP Rehab Barriers to Discharge: Barriers Resolved   Patient Goals and CMS Choice        Expected Discharge Plan and Services Expected Discharge Plan: OP Rehab       Living arrangements for the past 2 months: Single Family Home Expected Discharge Date: 07/04/21                                    Prior Living Arrangements/Services Living arrangements for the past 2 months: Single Family Home Lives with:: Self Patient language and need for interpreter reviewed:: Yes Do you feel safe going back to the place where you live?: Yes      Need for Family Participation in Patient Care: Yes (Comment) Care giver support system in place?: Yes (comment)   Criminal Activity/Legal Involvement Pertinent to Current Situation/Hospitalization: No - Comment as needed  Activities of Daily Living      Permission Sought/Granted Permission sought to share information with : Case Manager Permission granted to share information with : Yes, Verbal Permission Granted              Emotional Assessment Appearance:: Appears stated age Attitude/Demeanor/Rapport: Gracious, Engaged Affect (typically observed): Pleasant, Appropriate Orientation: : Oriented to Self, Oriented to Place, Oriented to  Time, Oriented to  Situation Alcohol / Substance Use: Not Applicable Psych Involvement: No (comment)  Admission diagnosis:  CVA (cerebral vascular accident) ([place]) [I63.9] New onset type 2 diabetes mellitus ([place]) [E11.9] Cerebrovascular accident (CVA), unspecified mechanism ([place]) [I63.9] Patient Active Problem List   Diagnosis Date Noted   CVA (cerebral vascular accident) ([place]) 07/01/2021   New onset type 2 diabetes mellitus ([place]) 07/01/2021   Dyslipidemia 07/01/2021   Hypertensive urgency 07/01/2021   PCP:  Pcp, No Pharmacy:   [facility] [place] [address] Phone: [phone] Fax: [phone]     Social Determinants of Health (SDOH) Interventions    Readmission Risk Interventions     View : No data to display.

## 2021-07-06 ENCOUNTER — Other Ambulatory Visit: Payer: Self-pay | Admitting: Pharmacy Technician

## 2021-07-06 NOTE — Patient Outreach (Signed)
Patient only signed DOH Attestation.  Would need to provide current year's household income if PAP medications were needed. ? ?Nawaal Alling J. Glendon Fiser ?Patient Advocate Specialist ?[facility]  ?

## 2021-07-07 ENCOUNTER — Other Ambulatory Visit: Payer: Self-pay

## 2021-07-12 ENCOUNTER — Other Ambulatory Visit: Payer: Self-pay

## 2021-07-12 ENCOUNTER — Telehealth: Payer: Self-pay

## 2021-07-12 NOTE — Telephone Encounter (Signed)
Received call from family expressing concerns related to obtaining ST services after discharge due to patient not having a PCP. Confirmed patient has appointment with Dr. Melrose Nakayama @ [facility] and referral could come from that appointment. Called and spoke to mother, Vaughan Basta who confirmed they had been able to move up appointment for today with Dr. Manuella Ghazi @ [facility]. Confirmed for mother that referral could come from Dr. Manuella Ghazi and could go to [employer]. Writer had outreached to [employer], CCC-SLP to confirm availability and insurance ability.    TOC writer called to [employer] and received updated list of available PCP's for insurance within 5 miles of patient address. Able to provide options to mother. Confirmed TOC contact info in the event any questions developed at appointment today.

## 2021-07-12 NOTE — TOC CM/SW Note (Deleted)
Received call from family expressing concerns related to obtaining ST services after discharge due to patient not having a PCP. Confirmed patient has appointment with Dr. Potter @ [facility] and referral could come from that appointment. Called and spoke to mother, Cheyenne Martinez who confirmed they had been able to move up appointment for today with Dr. Shah @ [facility]. Confirmed for mother that referral could come from Dr. Shah and could go to [facility]. Writer had outreached to [facility], CCC-SLP to confirm availability and insurance ability.    TOC writer called to [employer] and received updated list of available PCP's for insurance within 5 miles of patient address. Able to provide options to mother. Confirmed TOC contact info in the event any questions developed at appointment today.  

## 2021-07-13 ENCOUNTER — Encounter: Payer: Self-pay | Admitting: Pharmacy Technician

## 2021-07-13 ENCOUNTER — Other Ambulatory Visit: Payer: Self-pay

## 2021-07-13 NOTE — Patient Outreach (Signed)
Spoke with [employer] From [employer].  Patient has prescription drug coverage through [facility].  Crystal says this is a retirement plan from [place].  The BIN# is [id number], PCN# is [id number], Pt ID# is [id number], [employer].  Phone number is [phone].  Willeen Niece Patient Advocate Specialist [facility]

## 2021-07-14 ENCOUNTER — Other Ambulatory Visit: Payer: Self-pay

## 2021-07-18 ENCOUNTER — Ambulatory Visit: Payer: Managed Care, Other (non HMO) | Attending: Neurology

## 2021-07-18 DIAGNOSIS — R482 Apraxia: Secondary | ICD-10-CM | POA: Diagnosis present

## 2021-07-18 DIAGNOSIS — R262 Difficulty in walking, not elsewhere classified: Secondary | ICD-10-CM | POA: Insufficient documentation

## 2021-07-18 DIAGNOSIS — I63532 Cerebral infarction due to unspecified occlusion or stenosis of left posterior cerebral artery: Secondary | ICD-10-CM | POA: Insufficient documentation

## 2021-07-18 DIAGNOSIS — R2681 Unsteadiness on feet: Secondary | ICD-10-CM | POA: Diagnosis present

## 2021-07-18 DIAGNOSIS — R278 Other lack of coordination: Secondary | ICD-10-CM | POA: Diagnosis present

## 2021-07-18 DIAGNOSIS — R4701 Aphasia: Secondary | ICD-10-CM | POA: Insufficient documentation

## 2021-07-18 DIAGNOSIS — M6281 Muscle weakness (generalized): Secondary | ICD-10-CM | POA: Diagnosis present

## 2021-07-18 NOTE — Therapy (Signed)
[facility] MAIN [facility] SERVICES [address] [place], [place], [address] Phone: [phone]   Fax:  [phone]  Occupational Therapy Evaluation  Patient Details  Name: Kamill KIANTE PETROVICH MRN: [id number] Date of Birth: 04/29/1975 Referring Provider (OT): Dr. Theora Master   Encounter Date: 07/18/2021   OT End of Session - 07/18/21 1438     Visit Number 1    Number of Visits 24    Date for OT Re-Evaluation 10/09/21    Authorization Time Period Reporting period beginning 07/18/21    OT Start Time 1300    OT Stop Time 1358    OT Time Calculation (min) 58 min    Activity Tolerance Patient tolerated treatment well    Behavior During Therapy Restless;Anxious;Agitated;Impulsive             No past medical history on file.  No past surgical history on file.  There were no vitals filed for this visit.   Subjective Assessment - 07/18/21 1432     Subjective  Daughter Leavy Cella reports that she wasn't living at home prior to pt's stroke, but is now living there to help with pt's care.    Pertinent History 46 y.o. African-American female with no chronic medical problems, who presented to the ER with acute onset of altered mental status and difficulty finding words with occasional slurred speech over the last couple of days.  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 Associated mild petechial blood products at the left occipital lobe without frank hemorrhagic transformation or significant mass effect. 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     Patient also has new diagnosis of hypertension and diabetes with A1c of 11.2.  Lipid profile with elevated cholesterol at 238, triglyceride 159, HDL 39 and LDL 169 with goal less than 70.    Limitations R  sided hemiparesis, incoordination, R visual field deficit, gait instability, speech/language deficits    Patient Stated Goals Unable to state goal d/t aphasia.  Family confirms goal is to return to being indep and strengthening R dominant side.    Currently in Pain? Yes    Pain Score 1     Pain Location Shoulder    Pain Orientation Right    Pain Descriptors / Indicators Tightness    Pain Type Acute pain    Pain Onset 1 to 4 weeks ago    Pain Frequency Intermittent    Aggravating Factors  end range stretch with shoulder flexion    Pain Relieving Factors ROM within pain free range    Effect of Pain on Daily Activities mild pain with overhead reach (reaches overhead only with active assist)    Multiple Pain Sites No               [facility] OT Assessment - 07/18/21 0001       Assessment   Medical Diagnosis CVA    Referring Provider (OT) Dr. Theora Master    Onset Date/Surgical Date 07/01/21    Hand Dominance Right    Next MD Visit July 11th with Dr. Malvin Johns    Prior Therapy acute evaluations only      Precautions   Precautions Fall      Restrictions   Weight Bearing Restrictions No      Balance Screen   Has the patient fallen in the past  6 months Yes   a couple of days ago   How many times? [place] expects to be discharged to: Private residence    Living Arrangements Children    Available Help at Discharge Family    Type of [place]    Entrance Stairs-Rails None   family assists   Home Layout One level    Alternate Level Stairs - Number of Steps 4, bilat hand rails    Bathroom Electrical engineer    Lives With Family   lives with 2 sons (55 and 81 year olds), and Daughter Delana Meyer moved home to assist with care.  Pt's mother, Vaughan Basta also helps out daily as she lives 5 min away.     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[employer]    Leisure taking the boys for walks, travels, hang out with family, go to parks      ADL   Eating/Feeding Needs assist with cutting food   using L non-dominant hand to eat   Nurse, children's independent    [place]   using L non-dominant hand   Tub/Shower Transfer Modified independent      IADL   Meal Prep Prepares adequate meal if supplied with ingredients   using L non dominant hand   Medication Management Is responsible for taking medication in correct dosages at correct time      Mobility   Mobility Status History of falls    Mobility Status Comments furniture walks at home, wall walks   transport chair used to reach therapy clinic from upstairs     Written Expression   Dominant Hand Right    Handwriting --   pt writes legibly with L non-dominant hand.  She can hold a pen in her R with assist for set up, but unable to make marks on paper     Vision - History   Baseline Vision No visual deficits      Vision Assessment   Tracking/Visual Pursuits Requires cues, head turns, or add eye shifts to track    Saccades Additional head turns occurred during testing    Visual Fields Right visual field deficit      Activity Tolerance   Activity Tolerance Tolerates 10-20 min activity with multiple rests   cognitive fatigue     Cognition   Overall Cognitive Status Impaired/Different from baseline   Speech and language deficits to be tested with SLP evaluation this week   Cognition Comments Pt frustrates easily, presents with mood lability, tearful at times.  Frequent prompts needed for attention to task.  Yes/no responses are inconsistent (confirmed by family present)      Observation/Other Assessments   Focus on Therapeutic Outcomes (FOTO)  36      Sensation   Light Touch --   pt states yes to tingling in RUE, but unable to accurately assess d/t speech/language difficulties   Proprioception Appears  Intact      Coordination   Gross Motor Movements are Fluid and Coordinated No    Fine Motor Movements are Fluid and Coordinated No    Coordination and Movement Description moderate-severe motor apraxia throughout RUE    Right 9 Hole Peg Test unable    Left [address]  Peg Test 23 sec      Perception   Inattention/Neglect Impaired - to be further tested in functual context   mild R sided inattention     Praxis   Praxis Impairment Details Motor planning      AROM   Overall AROM Comments R shoulder 0-63, abd 0-70      Strength   Overall Strength Comments R shoulder -3 all planes, R elbow flex/ext 4-, wrist flex/ext 3+ (LUE 5/5)      Hand Function   Right Hand Grip (lbs) 0    Right Hand Lateral Pinch 2 lbs    Right Hand 3 Point Pinch 0 lbs   unable to keep fingers in 3 point pinch pattern   Left Hand Grip (lbs) 72    Left Hand Lateral Pinch 20 lbs    Left 3 point pinch 21 lbs      RUE Tone   Hypertonic Details mild flexor hypertonicity elbow and hand            Occupational Therapy: Pt is a 46 y/o female s/p L PCA stroke, admitted to the hospital on 07/01/21.  Pt present with daughter Delana Meyer, and mother, Vaughan Basta, both primary caregivers and able to assist in answering questions d/t pt with significant speech and language deficits.  Yes/no replies were inconsistent throughout eval, answers confirmed with family.  Pt restless, anxious, tearful at times, required frequent prompting for attention to task, occasionally closed eyes when appearing to be mentally fatigued and pt with low frustration tolerance.  Family very encouraging.  Family reported that pt had a follow up after her hospital d/c with Dr. Melrose Nakayama on [facility] June 15th and presented with decline in speech, use of RUE, and more difficulty walking the day after this visit as compared to when she discharged home from the hospital.  OT called to notified Dr. Lannie Fields office of these reported symptoms.  Courtney from Dr. Lannie Fields office  followed up with pt's family and encouraged ED if symptoms worsened further, but that it was likely pt is just deconditioned as she was unable to get any home health after her hospital d/c d/t insurance limitations.  Dr. Lannie Fields office encouraged family to call their office with any additional changes.  Family reports that pt is currently bathing and dressing herself, but pt is currently using L non-dominant hand for all self care tasks d/t extent of weakness throughout RUE, see note for details.  Pt also presents with mild R sided inattention and possible R field cut, though visual assessment was difficult d/t speech/language deficits and pt inconsistent to follow commands or verbalize visual changes.  Pt will benefit from skilled OT to address deficits noted above, working to engage RUE into daily tasks and maximize safety and indep in the home.     Neuro re-ed: Advised family on apparent R visual field cut and mild R sided inattention; encouraged family/visitors to sit to pt's R when visiting or during meals to increase attention and head turns to the R side.  Advised on fall prevention, needing to cue pt for head turns when navigating familiar and unfamiliar environments.  Issued soft blue theraputty and instructed pt in gross grasping and rolling on table top.  Pt required mod A to perform.  Encouraged use at least 2x daily for 5-10 min at a time for increasing strength and coordination in R hand.  Will add exercises as strength progresses.      OT Education - 07/18/21 1437  Education Details Role of OT, goals, poc, putty/RUE exercises    Person(s) Educated Patient;Parent(s);Child(ren)    Methods Explanation;Demonstration;Tactile cues;Verbal cues    Comprehension Verbalized understanding;Returned demonstration;Verbal cues required;Tactile cues required;Need further instruction              OT Short Term Goals - 07/18/21 1444       OT SHORT TERM GOAL #1   Title Pt will perform RUE  HEP for increasing strength and coordination with min vc.    Baseline Eval: initiated at eval; requires mod A with gripping and rolling putty    Time 6    Period Weeks    Status New    Target Date 08/28/21      OT SHORT TERM GOAL #2   Title Family will provide min vc to increase attention to R side for pt to more easily navigate her environment.    Baseline Eval: initiated education at eval as family was unaware of any visual decline (pt presents with mild R sided inattention and possible R field cut)    Time 6    Period Weeks    Status New    Target Date 08/28/21               OT Long Term Goals - 07/18/21 1447       OT LONG TERM GOAL #1   Title Pt will increase FOTO score to 45 or better to indicate perceived increased performance with daily tasks.    Baseline Eval: FOTO 36    Time 12    Period Weeks    Status New    Target Date 10/09/21      OT LONG TERM GOAL #2   Title Pt will increase RUE MMT by 1 muscle grade for improved engagement of RUE into self care tasks.    Baseline Eval: Pt using L non-dominant arm for all daily tasks; R shoulder grossly 3-, elbow 4-, wrist 3+    Time 12    Period Weeks    Status New    Target Date 10/09/21      OT LONG TERM GOAL #3   Title Pt will increase R grip strength by 7# to enable pt to hold and carry light ADL supplies in R dominant hand.    Baseline Eval: R 0, L 72    Time 12    Period Weeks    Status New    Target Date 10/09/21      OT LONG TERM GOAL #4   Title Pt will write name with R dominant hand with 50% legibility    Baseline Eval: pt can hold pen in R hand with OT assisting with set up, but pt can not make a mark on the paper    Time 12    Period Weeks    Status New    Target Date 10/09/21      OT LONG TERM GOAL #5   Title Pt will improve R hand dexterity for picking up pills with dominant hand.    Baseline Eval: R 9 hole unable (uses L non-dominant hand to pick up pills)    Time 12    Period Weeks     Status New    Target Date 10/09/21                   Plan - 07/18/21 1439     Clinical Impression Statement Pt is a 46 y/o female s/p L PCA stroke, admitted to  the hospital on 07/01/21.  Pt present with daughter Delana Meyer, and mother, Vaughan Basta, both primary caregivers and able to assist in answering questions d/t pt with significant speech and language deficits.  Yes/no replies were inconsistent throughout eval, answers confirmed with family.  Pt restless, anxious, tearful at times, required frequent prompting for attention to task, occasionally closed eyes when appearing to be mentally fatigued and pt with low frustration tolerance.  Family very encouraging.  Family reported that pt had a follow up after her hospital d/c with Dr. Melrose Nakayama on [facility] June 15th and presented with decline in speech, use of RUE, and more difficulty walking the day after this visit as compared to when she discharged home from the hospital.  OT called to notified Dr. Lannie Fields office of these reported symptoms.  Courtney from Dr. Lannie Fields office followed up with pt's family and encouraged ED if symptoms worsened further, but that it was likely pt is just deconditioned as she was unable to get any home health after her hospital d/c d/t insurance limitations.  Dr. Lannie Fields office encouraged family to call their office with any additional changes.  Family reports that pt is currently bathing and dressing herself, but pt is currently using L non-dominant hand for all self care tasks d/t extent of weakness throughout RUE, see note for details.  Pt also presents with mild R sided inattention and possible R field cut, though visual assessment was difficult d/t speech/language deficits and pt inconsistent to follow commands or verbalize visual changes.  Pt will benefit from skilled OT to address deficits noted above, working to engage RUE into daily tasks and maximize safety and indep in the home.    OT Occupational Profile and History  Problem Focused Assessment - Including review of records relating to presenting problem    Occupational performance deficits (Please refer to evaluation for details): ADL's;IADL's;Work;Leisure    Body Structure / Function / Physical Skills ADL;GMC;ROM;UE functional use;Balance;Endurance;FMC;Pain;Strength;Coordination;Dexterity;Gait;IADL;Sensation;Tone;Vision    Cognitive Skills Attention;Safety Awareness;Understand;Emotional;Temperament/Personality    Rehab Potential Good    Clinical Decision Making Several treatment options, min-mod task modification necessary    Comorbidities Affecting Occupational Performance: None    Modification or Assistance to Complete Evaluation  Min-Moderate modification of tasks or assist with assess necessary to complete eval    OT Frequency 2x / week    OT Duration 12 weeks    OT Treatment/Interventions Self-care/ADL training;Therapeutic exercise;Therapist, nutritional;Therapeutic activities;Visual/perceptual remediation/compensation;Balance training;Coping strategies training;Cryotherapy;Moist Heat;Neuromuscular education;DME and/or AE instruction;Manual Therapy;Passive range of motion;Patient/family education    OT Home Exercise Plan Initiated theraputty exercises for gripping and rolling putty with RUE    Consulted and Agree with Plan of Care Patient;Family member/caregiver    Family Member Consulted Mother, Vaughan Basta and daughter, Delana Meyer             Patient will benefit from skilled therapeutic intervention in order to improve the following deficits and impairments:   Body Structure / Function / Physical Skills: ADL, GMC, ROM, UE functional use, Balance, Endurance, FMC, Pain, Strength, Coordination, Dexterity, Gait, IADL, Sensation, Tone, Vision Cognitive Skills: Attention, Safety Awareness, Understand, Emotional, Temperament/Personality     Visit Diagnosis: Muscle weakness (generalized)  Other lack of coordination    Problem List Patient Active  Problem List   Diagnosis Date Noted   CVA (cerebral vascular accident) ([place]) 07/01/2021   New onset type 2 diabetes mellitus ([place]) 07/01/2021   Dyslipidemia 07/01/2021   Hypertensive urgency 07/01/2021   Leta Speller, MS, OTR/L  Darleene Cleaver, OT 07/18/2021, 3:16  PM  [place] MAIN [facility] SERVICES [address] [place], [place], [address] Phone: [phone]   Fax:  [phone]  Name: Beverlee CLARIE HEDRICH MRN: [id number] Date of Birth: April 23, 1975

## 2021-07-20 ENCOUNTER — Ambulatory Visit: Payer: Managed Care, Other (non HMO)

## 2021-07-20 DIAGNOSIS — M6281 Muscle weakness (generalized): Secondary | ICD-10-CM | POA: Diagnosis not present

## 2021-07-20 DIAGNOSIS — R2681 Unsteadiness on feet: Secondary | ICD-10-CM

## 2021-07-20 DIAGNOSIS — R262 Difficulty in walking, not elsewhere classified: Secondary | ICD-10-CM

## 2021-07-20 NOTE — Therapy (Signed)
OUTPATIENT PHYSICAL THERAPY NEURO EVALUATION   Patient Name: Cheyenne Martinez MRN: [id number] DOB:[birth year], 46 y.o., female Today's Date: 07/20/2021   PCP: Morene Crocker MD REFERRING PROVIDER: Morene Crocker MD   PT End of Session - 07/20/21 1232     Visit Number 1    Number of Visits 24    Date for PT Re-Evaluation 10/12/21    Authorization Type 1/10 eval 07/20/21    PT Start Time 1015    PT Stop Time 1100    PT Time Calculation (min) 45 min    Equipment Utilized During Treatment Gait belt    Activity Tolerance Patient tolerated treatment well;Patient limited by fatigue    Behavior During Therapy Restless;Impulsive             History reviewed. No pertinent past medical history. History reviewed. No pertinent surgical history. Patient Active Problem List   Diagnosis Date Noted   CVA (cerebral vascular accident) (HCC) 07/01/2021   New onset type 2 diabetes mellitus (HCC) 07/01/2021   Dyslipidemia 07/01/2021   Hypertensive urgency 07/01/2021     ONSET DATE: 07/01/21  REFERRING DIAG: CVA  THERAPY DIAG:  Difficulty in walking, not elsewhere classified  Unsteadiness on feet  Muscle weakness (generalized)  Rationale for Evaluation and Treatment Rehabilitation  SUBJECTIVE:                                                                                                                                                                                              SUBJECTIVE STATEMENT: Patient presents to PT for evaluation s/p CVA affecting R side of body Pt accompanied by: family member  PERTINENT HISTORY: Patient is a 46 year old female who presented to ER with AMS on 07/01/21; MRI showed early subacute left PCA distribution infarct with ischemic changes within the left frontotemporal and right parietal lobe. Associated mild petechial blood products at the left occipital lobe without frank hemorrhagic transformation or significant mass effect. 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     Patient also has new diagnosis of hypertension and diabetes. Per patient's evaluation with OT. Patient's daughter is now living with patient s/p stroke. Patient is very aphasic.  Patient has had one fall. Patient was working at [employer] and independent prior to stroke. Patient is walking without anything.   PAIN:  Are you having pain? No  PRECAUTIONS: Fall  WEIGHT BEARING RESTRICTIONS No  FALLS: Has patient fallen in last 6 months? Yes. Number of falls 1  LIVING ENVIRONMENT: Lives with: lives with their family Lives in: House/apartment Stairs: Yes: External:  4 steps; on right going up and on left going up Has following equipment at home: None  PLOF: Independent  PATIENT GOALS to get stronger and use RLE   OBJECTIVE:   DIAGNOSTIC FINDINGS: MRI showed early subacute left PCA distribution infarct with ischemic changes within the left frontotemporal and right parietal lobe. Associated mild petechial blood products at the left occipital lobe without frank hemorrhagic transformation or significant mass effect. 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  COGNITION: Overall cognitive status: Impaired   SENSATION: Light touch: Impaired  on LLE  COORDINATION: Impaired on LLE, limited coordination and spatial awareness.   POSTURE: rounded shoulders, flexed trunk , and weight shift left    LOWER EXTREMITY MMT:    MMT Right Eval Left Eval  Hip flexion 2+ 5  Hip extension 2+ 5  Hip abduction 2 5  Hip adduction 2 5  Hip internal rotation 2 5  Hip external rotation 2 5  Knee flexion 3 5  Knee extension 3 5  Ankle dorsiflexion 1 5  Ankle plantarflexion 1 5  Ankle inversion 0 5  Ankle eversion 0 5  (Blank rows = not tested) Patient has no activation of R ankle *limited ability to follow muscle testing command   BED  MOBILITY:  Sit to supine Modified independence Supine to sit Modified independence  TRANSFERS: Assistive device utilized: None  Sit to stand: CGA Stand to sit: CGA Chair to chair: CGA    STAIRS:  Level of Assistance: CGA  Stair Negotiation Technique: Step to Pattern with Single Rail on Left  Number of Stairs: 4   Height of Stairs: standard    GAIT: Gait pattern: step to pattern, decreased arm swing- Right, decreased stance time- Right, decreased hip/knee flexion- Right, decreased ankle dorsiflexion- Right, ataxic, and poor foot clearance- RightRLE knee hyperextension Distance walked: 88ft Assistive device utilized: None; patient refuse AD Level of assistance: CGA; two episodes of minA due to patient LOB  Comments: patient is unsafe and has limited foot clearance of affected RLE. Would benefit from AFO   FUNCTIONAL TESTs:  5 times sit to stand: 20 seconds with LUE support and heavy LLE use 10 meter walk test: 26 seconds with three near LOB    PATIENT SURVEYS:  FOTO 49; predicted discharge 20   TODAY'S TREATMENT:  Review HEP: see below AFO education: will send cert to MD.    PATIENT EDUCATION: Education details: HEP, goals, POC  Person educated: Patient and Caregiver Education method: Explanation, Demonstration, Tactile cues, Verbal cues, and Handouts Education comprehension: returned demonstration, verbal cues required, tactile cues required, and needs further education   HOME EXERCISE PROGRAM: Access Code: [email or web address] URL: [email or web address] Date: 07/20/2021 Prepared by: Janna Arch  Exercises - Leg Extension  - 1 x daily - 7 x weekly - 2 sets - 10 reps - 5 hold - Seated March  - 1 x daily - 7 x weekly - 2 sets - 10 reps - 5 hold - Seated Hip Abduction  - 1 x daily - 7 x weekly - 2 sets - 10 reps - 5 hold - Seated Hip Adduction Isometrics with Ball  - 1 x daily - 7 x weekly - 2 sets - 10 reps - 5 hold    GOALS: Goals reviewed with patient?  Yes  SHORT TERM GOALS: Target date: 08/17/2021  Patient will be independent in home exercise program to improve strength/mobility for better functional independence with ADLs. Baseline: 6/21: HEP  given  Goal status: INITIAL  2.  Patient will deny any falls over past 4 weeks to demonstrate improved safety awareness at home and work.  Baseline: 6/21: 1 fall  Goal status: INITIAL  LONG TERM GOALS: Target date: 10/12/2021  Patient will increase FOTO score to equal to or greater than  68   to demonstrate statistically significant improvement in mobility and quality of life.  Baseline: 6/21: 49 Goal status: INITIAL  2.   Patient (< 33 years old) will complete five times sit to stand test in < 10 seconds indicating an increased LE strength and improved balance Baseline: 6/21: 20 seconds with heavy LUE support Goal status: INITIAL  3.  Patient will increase 10 meter walk test to >1.71m/s as to improve gait speed for better community ambulation and to reduce fall risk. Baseline: 6/21 : 26 seconds with 3 LOB  Goal status: INITIAL  4.  Patient will increase BLE gross strength to 4+/5 as to improve functional strength for independent gait, increased standing tolerance and increased ADL ability. Baseline: 6/21: see note Goal status: INITIAL   ASSESSMENT:  CLINICAL IMPRESSION: Patient is a 46 y.o. female who was seen today for physical therapy evaluation and treatment for CVA. Patient is able to follow simple commands and visual cueing but is challenged/confused with complex commands. She is unable to perform the BERG due to the complexity of tasks and inability to single limb stance on LLE. Patient has limited strength and spatial awareness coordination of RLE. Patient will benefit from skilled physical therapy to improve strength, balance, and mobility as well as decrease fall risk and improve quality of life.    OBJECTIVE IMPAIRMENTS Abnormal gait, decreased activity tolerance, decreased  balance, decreased cognition, decreased coordination, decreased endurance, decreased knowledge of condition, decreased knowledge of use of DME, decreased mobility, difficulty walking, decreased strength, decreased safety awareness, and increased fascial restrictions.   ACTIVITY LIMITATIONS carrying, lifting, bending, standing, squatting, stairs, transfers, hygiene/grooming, locomotion level, and caring for others  PARTICIPATION LIMITATIONS: cleaning, laundry, interpersonal relationship, driving, shopping, community activity, occupation, yard work, and church  PERSONAL FACTORS Age, Behavior pattern, Education, Past/current experiences, Social background, Time since onset of injury/illness/exacerbation, Transportation, and 3+ comorbidities: CVA, HTN, dyslipidemia, DM type II  are also affecting patient's functional outcome.   REHAB POTENTIAL: Fair    CLINICAL DECISION MAKING: Evolving/moderate complexity  EVALUATION COMPLEXITY: Moderate  PLAN: PT FREQUENCY: 2x/week  PT DURATION: 12 weeks  PLANNED INTERVENTIONS: Therapeutic exercises, Therapeutic activity, Neuromuscular re-education, Balance training, Gait training, Patient/Family education, Joint mobilization, Stair training, Vestibular training, Canalith repositioning, Orthotic/Fit training, DME instructions, Cognitive remediation, Electrical stimulation, Cryotherapy, Moist heat, Splintting, Taping, Vasopneumatic device, Traction, Ultrasound, Biofeedback, and Manual therapy  PLAN FOR NEXT SESSION: balance education, strengthening interventions    Precious Bard, PT, DPT  07/20/2021, 12:36 PM

## 2021-07-21 ENCOUNTER — Ambulatory Visit: Payer: Managed Care, Other (non HMO) | Admitting: Speech Pathology

## 2021-07-21 DIAGNOSIS — I63532 Cerebral infarction due to unspecified occlusion or stenosis of left posterior cerebral artery: Secondary | ICD-10-CM

## 2021-07-21 DIAGNOSIS — R4701 Aphasia: Secondary | ICD-10-CM

## 2021-07-21 DIAGNOSIS — M6281 Muscle weakness (generalized): Secondary | ICD-10-CM | POA: Diagnosis not present

## 2021-07-22 ENCOUNTER — Encounter: Payer: Self-pay | Admitting: Speech Pathology

## 2021-07-25 ENCOUNTER — Other Ambulatory Visit: Payer: Self-pay

## 2021-07-26 ENCOUNTER — Ambulatory Visit: Payer: Managed Care, Other (non HMO)

## 2021-07-26 ENCOUNTER — Ambulatory Visit: Payer: Managed Care, Other (non HMO) | Admitting: Speech Pathology

## 2021-07-26 DIAGNOSIS — R2681 Unsteadiness on feet: Secondary | ICD-10-CM

## 2021-07-26 DIAGNOSIS — R262 Difficulty in walking, not elsewhere classified: Secondary | ICD-10-CM

## 2021-07-26 DIAGNOSIS — M6281 Muscle weakness (generalized): Secondary | ICD-10-CM

## 2021-07-26 DIAGNOSIS — R4701 Aphasia: Secondary | ICD-10-CM

## 2021-07-26 DIAGNOSIS — R482 Apraxia: Secondary | ICD-10-CM

## 2021-07-26 DIAGNOSIS — R278 Other lack of coordination: Secondary | ICD-10-CM

## 2021-07-26 DIAGNOSIS — I63532 Cerebral infarction due to unspecified occlusion or stenosis of left posterior cerebral artery: Secondary | ICD-10-CM

## 2021-07-26 NOTE — Therapy (Signed)
OUTPATIENT PHYSICAL THERAPY TREATMENT   Patient Name: Cheyenne Martinez MRN: [id number] DOB:22-Jun-1975, 46 y.o., female Today's Date: 07/26/2021   PCP: Morene Crocker MD REFERRING PROVIDER: Morene Crocker MD   PT End of Session - 07/26/21 1142     Visit Number 2    Number of Visits 24    Date for PT Re-Evaluation 10/12/21    Authorization Type 1/10 eval 07/20/21    PT Start Time 1147    PT Stop Time 1228    PT Time Calculation (min) 41 min    Equipment Utilized During Treatment Gait belt    Activity Tolerance Patient tolerated treatment well;Patient limited by fatigue    Behavior During Therapy Restless;Impulsive              History reviewed. No pertinent past medical history. History reviewed. No pertinent surgical history. Patient Active Problem List   Diagnosis Date Noted   CVA (cerebral vascular accident) (HCC) 07/01/2021   New onset type 2 diabetes mellitus (HCC) 07/01/2021   Dyslipidemia 07/01/2021   Hypertensive urgency 07/01/2021     ONSET DATE: 07/01/21  REFERRING DIAG: CVA  THERAPY DIAG:  Muscle weakness (generalized)  Unsteadiness on feet  Other lack of coordination  Difficulty in walking, not elsewhere classified  Rationale for Evaluation and Treatment Rehabilitation  SUBJECTIVE:                                                                                                                                                                                              SUBJECTIVE STATEMENT:  Pt reports she is very tired today. She slept only a little bit last night. No aches/pains currently. Pt reports no stumbles/falls. Pt's mother reports they started on her HEP.  Pt accompanied by: family member, mother [name]  PERTINENT HISTORY: Patient is a 46 year old female who presented to ER with AMS on 07/01/21; MRI showed early subacute left PCA distribution infarct with ischemic changes within the left frontotemporal and right parietal lobe. Associated  mild petechial blood products at the left occipital lobe without frank hemorrhagic transformation or significant mass effect. 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    Patient also has new diagnosis of hypertension and diabetes. Per patient's evaluation with OT. Patient's daughter is now living with patient s/p stroke. Patient is very aphasic.  Patient has had one fall. Patient was working at [employer] and independent prior to stroke. Patient is walking without anything.   PAIN:  Are you having pain? No  PRECAUTIONS: Fall  WEIGHT BEARING RESTRICTIONS No   PLOF: Independent  PATIENT GOALS to get stronger and use RLE   OBJECTIVE:   DIAGNOSTIC FINDINGS: MRI showed early subacute left PCA distribution infarct with ischemic changes within the left frontotemporal and right parietal lobe. Associated mild petechial blood products at the left occipital lobe without frank hemorrhagic transformation or significant mass effect. 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  COGNITION: Overall cognitive status: Impaired   SENSATION: Light touch: Impaired  on LLE  COORDINATION: Impaired on LLE, limited coordination and spatial awareness.     TODAY'S TREATMENT:   07/26/2021:   Provided pt with cert from MD for R AFO that was recently faxed to PT  - Leg Extension  - 1 sets - 10 reps - 5 hold each LE. Pt rates hard.   -Seated reclined trunk crunches in chair 10x. Pt rates easy. VC/demo/tc for technique  - Seated March  -  2 sets - 20x alt LE  -STS 3x10. Pt reports "just a little bit tiring"  - Seated Hip Abduction step-overs - initial attempt with orange hurdle. However, too difficult currently and modified to stepping over half-foam. Cuing for technique throughout and performed each LE to promote motor learning x multiple reps. Intermittent assist from PT  -  Seated Hip Adduction Isometrics with Ball  - 1 sets - 10 reps - 5 sec hold  - heel taps on hedgehog in seated 2x10-15 each LE. Intermittent assist from PT   PATIENT EDUCATION: Education details: Pt educated throughout session about proper posture and technique with exercises. Improved exercise technique, movement at target joints, use of target muscles after min to mod verbal, visual, tactile cues.  Person educated: Patient and Caregiver Education method: Explanation, Demonstration, Tactile cues, and Verbal cues Education comprehension: returned demonstration, verbal cues required, tactile cues required, and needs further education   HOME EXERCISE PROGRAM:   No updates as of 07/26/21  Access Code: [email or web address] URL: [email or web address] Date: 07/20/2021 Prepared by: Precious Bard  Exercises - Leg Extension  - 1 x daily - 7 x weekly - 2 sets - 10 reps - 5 hold - Seated March  - 1 x daily - 7 x weekly - 2 sets - 10 reps - 5 hold - Seated Hip Abduction  - 1 x daily - 7 x weekly - 2 sets - 10 reps - 5 hold - Seated Hip Adduction Isometrics with Ball  - 1 x daily - 7 x weekly - 2 sets - 10 reps - 5 hold    GOALS: Goals reviewed with patient? Yes  SHORT TERM GOALS: Target date: 08/17/2021  Patient will be independent in home exercise program to improve strength/mobility for better functional independence with ADLs. Baseline: 6/21: HEP given  Goal status: INITIAL  2.  Patient will deny any falls over past 4 weeks to demonstrate improved safety awareness at home and work.  Baseline: 6/21: 1 fall  Goal status: INITIAL  LONG TERM GOALS: Target date: 10/12/2021  Patient will increase FOTO score to equal to or greater than  68   to demonstrate statistically significant improvement in mobility and quality of life.  Baseline: 6/21: 49 Goal status: INITIAL  2.   Patient (< 49 years old) will complete five times sit to stand test in < 10 seconds indicating an increased LE  strength and improved balance Baseline: 6/21: 20 seconds with heavy LUE support Goal status: INITIAL  3.  Patient will increase 10 meter walk test to >1.43m/s as to improve gait  speed for better community ambulation and to reduce fall risk. Baseline: 6/21 : 26 seconds with 3 LOB  Goal status: INITIAL  4.  Patient will increase BLE gross strength to 4+/5 as to improve functional strength for independent gait, increased standing tolerance and increased ADL ability. Baseline: 6/21: see note Goal status: INITIAL   ASSESSMENT:  CLINICAL IMPRESSION: Reviewed interventions from HEP to reinforce correct technique. Pt continues to require multi-modal cuing. She is challenged with foot clearance exercise (seated). She did show within-session improvement with RLE following cues. Patient will benefit from skilled physical therapy to improve strength, balance, and mobility as well as decrease fall risk and improve quality of life.    OBJECTIVE IMPAIRMENTS Abnormal gait, decreased activity tolerance, decreased balance, decreased cognition, decreased coordination, decreased endurance, decreased knowledge of condition, decreased knowledge of use of DME, decreased mobility, difficulty walking, decreased strength, decreased safety awareness, and increased fascial restrictions.   ACTIVITY LIMITATIONS carrying, lifting, bending, standing, squatting, stairs, transfers, hygiene/grooming, locomotion level, and caring for others  PARTICIPATION LIMITATIONS: cleaning, laundry, interpersonal relationship, driving, shopping, community activity, occupation, yard work, and church  PERSONAL FACTORS Age, Behavior pattern, Education, Past/current experiences, Social background, Time since onset of injury/illness/exacerbation, Transportation, and 3+ comorbidities: CVA, HTN, dyslipidemia, DM type II are also affecting patient's functional outcome.   REHAB POTENTIAL: Fair   CLINICAL DECISION MAKING: Evolving/moderate  complexity  EVALUATION COMPLEXITY: Moderate  PLAN: PT FREQUENCY: 2x/week  PT DURATION: 12 weeks  PLANNED INTERVENTIONS: Therapeutic exercises, Therapeutic activity, Neuromuscular re-education, Balance training, Gait training, Patient/Family education, Joint mobilization, Stair training, Vestibular training, Canalith repositioning, Orthotic/Fit training, DME instructions, Cognitive remediation, Electrical stimulation, Cryotherapy, Moist heat, Splintting, Taping, Vasopneumatic device, Traction, Ultrasound, Biofeedback, and Manual therapy  PLAN FOR NEXT SESSION: balance education, strengthening interventions, continue plan  Temple Pacini PT, DPT   07/26/2021, 2:51 PM

## 2021-07-28 ENCOUNTER — Ambulatory Visit: Payer: Managed Care, Other (non HMO) | Admitting: Occupational Therapy

## 2021-07-28 ENCOUNTER — Encounter: Payer: Self-pay | Admitting: Occupational Therapy

## 2021-07-28 ENCOUNTER — Ambulatory Visit: Payer: Managed Care, Other (non HMO) | Admitting: Speech Pathology

## 2021-07-28 DIAGNOSIS — R4701 Aphasia: Secondary | ICD-10-CM

## 2021-07-28 DIAGNOSIS — R482 Apraxia: Secondary | ICD-10-CM

## 2021-07-28 DIAGNOSIS — M6281 Muscle weakness (generalized): Secondary | ICD-10-CM

## 2021-07-28 DIAGNOSIS — I63532 Cerebral infarction due to unspecified occlusion or stenosis of left posterior cerebral artery: Secondary | ICD-10-CM

## 2021-07-28 NOTE — Therapy (Signed)
OUTPATIENT OCCUPATIONAL THERAPY TREATMENT NOTE   Patient Name: Cheyenne Martinez MRN: [id number] DOB:[birth year], 46 y.o., female Today's Date: 07/28/2021  REFERRING PROVIDER: Gurney Maxin, MD   OT End of Session - 07/28/21 1144     Visit Number 2    Number of Visits 24    Date for OT Re-Evaluation 10/09/21    Authorization Time Period Reporting period beginning 07/18/21    OT Start Time 1055    OT Stop Time 1135    OT Time Calculation (min) 40 min    Activity Tolerance Patient tolerated treatment well    Behavior During Therapy Restless;Impulsive             History reviewed. No pertinent past medical history. History reviewed. No pertinent surgical history. Patient Active Problem List   Diagnosis Date Noted   CVA (cerebral vascular accident) ([place]) 07/01/2021   New onset type 2 diabetes mellitus ([place]) 07/01/2021   Dyslipidemia 07/01/2021   Hypertensive urgency 07/01/2021      REFERRING DIAG: CVA  THERAPY DIAG:  Muscle weakness (generalized)  Rationale for Evaluation and Treatment Rehabilitation  PERTINENT HISTORY: 46 y.o. African-American female with no chronic medical problems, who presented to the ER with acute onset of altered mental status and difficulty finding words with occasional slurred speech over the last couple of days.  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 Associated mild petechial blood products at the left occipital lobe without frank hemorrhagic transformation or significant mass effect. 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 Patient also has new diagnosis of hypertension and diabetes with A1c of 11.2.  Lipid profile with elevated cholesterol at 238, triglyceride 159, HDL 39 and LDL 169 with goal less than 70.  PRECAUTIONS:  None  SUBJECTIVE:  Pt. was present for therapy with her brother Randall Hiss.  PAIN:  Are you having pain? No  OBJECTIVE:   TODAY'S TREATMENT:   Pt. tolerated RUE AAROM in the right shoulder for flexion, abduction, horizontal abduction, and adduction. Pt.tolerated AROM reps for forearm supination, pronation, wrist extension, digit flexion, and extension at the tabletop with resistance, thumb palmar, and radial abduction. Pt. Performed reps of  right gross grasp, release, and move a cone.  Pt. Worked on bilateral hand use grasping, and stabilizing various sized small jars with the right hand while opening them with her left hand. Pt. Performed lateral pinch using a yellow resistive clip. Pt. Worked on using her right hand to grasp, place, and stack [place] style pegs, performing 2 trials. Pt. Worked on gripping The large handle on the EZ-Board and performing supination with light resistance.   Pt. Indicated that her right hand hurts when using the theraputty at home. Pt./caregiver education was provided about stopping theraputty use if Pt. Is having pain. Education was provided RUE ROM, as well as about opportunities to engage the right hand during daily tasks at home. Pt. reports no pain today during the session. Pt. required cues for visual demonstration for each task. Pt tolerated ROM well, and tolerated all tasks attempted with the RUE. Pt. was able to stabilize jars with the right hand while opening them with the left hand. Pt. was able to stack a set of [place] style discs in the 1st trial, and 3 in the 2nd. Pt. Required cues for rest breaks. Pt. Continues to present with  impaired motor control,  limited digit extension, and thumb abduction. Pt. Continues to work on improving UE strength, and [facility] skills in order to work towards improving RUE functioning, and maximizing engagement of the RUE, and overall independence with ADLs, and IADL tasks.   PATIENT EDUCATION: Education details: Pt.caregiver  education about  RUE ROM, and opportunities for engagement of the RUE, and hand during ADL/IADL tasks. Person educated: Patient and Caregiver Education method: Explanation, Demonstration, and Verbal cues Education comprehension: verbalized understanding, returned demonstration, verbal cues required, and needs further education   HOME EXERCISE PROGRAM Pt. currently has a HEP for right hand strengthening with light blue theraputty. Pt./brother education about daily UE ROM.   OT Short Term Goals - 07/18/21 1444       OT SHORT TERM GOAL #1   Title Pt will perform RUE HEP for increasing strength and coordination with min vc.    Baseline Eval: initiated at eval; requires mod A with gripping and rolling putty    Time 6    Period Weeks    Status New    Target Date 08/28/21      OT SHORT TERM GOAL #2   Title Family will provide min vc to increase attention to R side for pt to more easily navigate her environment.    Baseline Eval: initiated education at eval as family was unaware of any visual decline (pt presents with mild R sided inattention and possible R field cut)    Time 6    Period Weeks    Status New    Target Date 08/28/21              OT Long Term Goals - 07/18/21 1447       OT LONG TERM GOAL #1   Title Pt will increase FOTO score to 45 or better to indicate perceived increased performance with daily tasks.    Baseline Eval: FOTO 36    Time 12    Period Weeks    Status New    Target Date 10/09/21      OT LONG TERM GOAL #2   Title Pt will increase RUE MMT by 1 muscle grade for improved engagement of RUE into self care tasks.    Baseline Eval: Pt using L non-dominant arm for all daily tasks; R shoulder grossly 3-, elbow 4-, wrist 3+    Time 12    Period Weeks    Status New    Target Date 10/09/21      OT LONG TERM GOAL #3   Title Pt will increase R grip strength by 7# to enable pt to hold and carry light ADL supplies in R dominant hand.    Baseline Eval: R 0, L  72    Time 12    Period Weeks    Status New    Target Date 10/09/21      OT LONG TERM GOAL #4   Title Pt will write name with R dominant hand with 50% legibility    Baseline Eval: pt can hold pen in R hand with OT assisting with set up, but pt can not make a mark on the paper    Time 12    Period Weeks    Status New    Target Date 10/09/21      OT LONG TERM GOAL #5   Title Pt will improve R hand dexterity for picking up pills with dominant hand.    Baseline Eval: R 9 hole unable (uses L non-dominant hand to  pick up pills)    Time 12    Period Weeks    Status New    Target Date 10/09/21              Plan - 07/28/21 1149     Clinical Impression Statement Pt. Indicated that her right hand hurts when using the theraputty at home. Pt./caregiver education was provided about stopping theraputty use if Pt. Is having pain. Education was provided RUE ROM, as well as about opportunities to engage the right hand during daily tasks at home. Pt. reports no pain today during the session. Pt. required cues for visual demonstration for each task. Pt tolerated ROM well, and tolerated all tasks attempted with the RUE. Pt. was able to stabilize jars with the right hand while opening them with the left hand. Pt. was able to stack a set of [place] style discs in the 1st trial, and 3 in the 2nd. Pt. Required cues for rest breaks. Pt. Continues to present with  impaired motor control, limited digit extension, and thumb abduction. Pt. Continues to work on improving UE strength, and [facility] skills in order to work towards improving RUE functioning, and maximizing engagement of the RUE, and overall independence with ADLs, and IADL tasks.       OT Occupational Profile and History Problem Focused Assessment - Including review of records relating to presenting problem    Occupational performance deficits (Please refer to evaluation for details): ADL's;IADL's;Work;Leisure    Body Structure / Function / Physical  Skills ADL;GMC;ROM;UE functional use;Balance;Endurance;FMC;Pain;Strength;Coordination;Dexterity;Gait;IADL;Sensation;Tone;Vision    Cognitive Skills Attention;Safety Awareness;Understand;Emotional;Temperament/Personality    Rehab Potential Good    Clinical Decision Making Several treatment options, min-mod task modification necessary    Comorbidities Affecting Occupational Performance: None    Modification or Assistance to Complete Evaluation  Min-Moderate modification of tasks or assist with assess necessary to complete eval    OT Frequency 2x / week    OT Duration 12 weeks    OT Treatment/Interventions Self-care/ADL training;Therapeutic exercise;Building services engineer;Therapeutic activities;Visual/perceptual remediation/compensation;Balance training;Coping strategies training;Cryotherapy;Moist Heat;Neuromuscular education;DME and/or AE instruction;Manual Therapy;Passive range of motion;Patient/family education    OT Home Exercise Plan Initiated theraputty exercises for gripping and rolling putty with RUE    Consulted and Agree with Plan of Care Patient;Family member/caregiver    Family Member Consulted Mother, [name] and daughter, Newton Pigg, [place], OTR/L   Olegario Messier, [place] 07/28/2021, 11:51 AM

## 2021-07-28 NOTE — Therapy (Signed)
OUTPATIENT SPEECH LANGUAGE PATHOLOGY TREATMENT NOTE  Patient Name: Aerilyn TARSHA BLANDO MRN: [id number] DOB:1975-03-18, 46 y.o., female Today's Date: 07/28/2021  PCP: No PCP REFERRING PROVIDER: Theora Master, MD   End of Session - 07/28/21 1018     Visit Number 3    Number of Visits 25    Date for SLP Re-Evaluation 10/13/21    Authorization Type Cigna/Cigna Managed    Progress Note Due on Visit 10    SLP Start Time 1000    SLP Stop Time  1100    SLP Time Calculation (min) 60 min    Activity Tolerance Patient tolerated treatment well             No past medical history on file. No past surgical history on file. 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 Evolving early subacute left PCA distribution infarct as above,  with additional scattered small volume ischemic changes within the  left frontotemporal and right parietal lobe as above. Associated  mild petechial blood products at the left occipital lobe without  frank hemorrhagic transformation or significant mass effect.  2. Underlying mild chronic microvascular ischemic disease with  chronic right cerebellar infarct.    07/04/2021 CTA HEAD NECK W WO CONTRAST  IMPRESSION:  Evolving recent infarcts as seen on recent MRI. There is  likely mild  petechial hemorrhage associated with the left parieto-occipital  infarct. No significant mass effect.  Plaque at the left greater than right ICA origins without  hemodynamically significant stenosis. Marked stenosis at the right  vertebral origin without apparent flow limitation.  Intracranial atherosclerosis involving anterior and posterior  circulations. No proximal intracranial vessel occlusion.    TODAY'S TREATMENT:  Skilled treatment session focused on pt's expressive and receptive language abilities. SLP facilitated session by providing the following interventions:  Using therapy activities on SGD pt able to: fill in missing missing numbers - Level 1: 100%; Level 2: 100%; Level 3: 100% Fill in missing letter - 50% improving to 80% with moderate cues to "say the alphabet"    Constant Therapy Clinician utilized to target receptive language and reading comprehension Understand words you hear: Level 1: 85% with repetition of the word and function; improving to 85% with repetition only Understand written words: Level 1: 90% independently Spell what you see (with help): Level 1: 0% - too advanced   PATIENT EDUCATION: Education details: requesting trial device from [place] Person educated: Patient, Child(ren), and Caregiver Education method: Explanation, Demonstration, and Verbal cues Education comprehension: needs further education   GOALS: Goals reviewed with patient? YES  SHORT TERM GOALS: Target date: 10 sessions  Given maximal multimodal cues, pt will increase speech approximation by repeating rote conversational words with 50% intelligibility. Baseline: unintelligible despite maximal cues Goal status: INITIAL  2.  Pt will use a SGD to answer simple biographical questions at 50% accuracy  given frequent maximal verbal and maximal visual cues. Baseline: no accuracy Goal status: INITIAL  3.  Pt will repeat single words using a speech generating device  in 50% of opportunities given frequent maximal verbal and maximal visual cues. Baseline: new goal Goal status: INITIAL   LONG TERM GOALS: Target date: 10/15/2021    Using multimodal communication, pt will express basic wants and needs in 8 out of 10 opportunities.  Baseline: unable Goal status: INITIAL  2.  Using multimodal communication, pt will answer basic questions in 8 out of 10 opportunities.  Baseline: unable Goal status: INITIAL  ASSESSMENT:  CLINICAL IMPRESSION: Pt presents with very few islands of intelligible speech but is able to follow directions within all tasks once a verbal and physical example has been given.   OBJECTIVE IMPAIRMENTS include expressive language, receptive language, aphasia, and dysarthria. These impairments are limiting patient from return to work, managing medications, managing appointments, managing finances, household responsibilities, ADLs/IADLs, and effectively communicating at home and in community. Factors affecting potential to achieve goals and functional outcome are severity of impairments. Patient will benefit from skilled SLP services to address above impairments and improve overall function.  REHAB POTENTIAL: Excellent  PLAN: SLP FREQUENCY: 2x/week  SLP DURATION: 12 weeks  PLANNED INTERVENTIONS: Language facilitation, Cueing hierachy, Internal/external aids, Functional tasks, Multimodal communication approach, and SLP instruction and feedback   Christoph Copelan B. Dreama Saa, M.S., CCC-SLP, Tree surgeon Certified Brain Injury Specialist [facility]  [facility] Rehabilitation Services Office [phone] Ascom [phone] Fax [phone]

## 2021-07-28 NOTE — Therapy (Addendum)
OUTPATIENT SPEECH LANGUAGE PATHOLOGY TREATMENT NOTE  Patient Name: Cheyenne Martinez MRN: [id number] DOB:07-13-1975, 46 y.o., female Today's Date: 07/28/2021  PCP: No PCP REFERRING PROVIDER: Theora Master, MD   End of Session - 07/28/21 0439     Visit Number 2    Number of Visits 25    Date for SLP Re-Evaluation 10/13/21    Authorization Type Cigna/Cigna Managed    Progress Note Due on Visit 10    SLP Start Time 1000    SLP Stop Time  1100    SLP Time Calculation (min) 60 min    Activity Tolerance Patient tolerated treatment well             No past medical history on file. No past surgical history on file. 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 Evolving early subacute left PCA distribution infarct as above,  with additional scattered small volume ischemic changes within the  left frontotemporal and right parietal lobe as above. Associated  mild petechial blood products at the left occipital lobe without  frank hemorrhagic transformation or significant mass effect.  2. Underlying mild chronic microvascular ischemic disease with  chronic right cerebellar infarct.    07/04/2021 CTA HEAD NECK W WO CONTRAST  IMPRESSION:  Evolving recent infarcts as seen on recent MRI. There is  likely mild  petechial hemorrhage associated with the left parieto-occipital  infarct. No significant mass effect.  Plaque at the left greater than right ICA origins without  hemodynamically significant stenosis. Marked stenosis at the right  vertebral origin without apparent flow limitation.  Intracranial atherosclerosis involving anterior and posterior  circulations. No proximal intracranial vessel occlusion.    TODAY'S TREATMENT:  Skilled treatment session focused on pt's expressive and receptive language abilities. SLP facilitated session by providing the following interventions:  Device Assessment:  Completed Quick Assess for AAC via [employer] app.  PHYSICAL ASSESSMENT:  Touch accuracy 100% for icons ranging large to small;Marland Kitchen  Vision field test 100% accuracy;  LANGUAGE ASSESSMENT:  Phrase Completion - LOW;  Categorization - LOW;  Repetition - low,  Confrontational Naming - LOW,  Conversational Speech - LOW;  DEVICE SIMULATION:  Simple Simulation - Completed,  Complex Simulation - Completed  Used clinic [employer] TouchTalk model with patient. Pt with profound difficulty selecting colors from field of 3 as she attempted reading the written word below the picture. Given pt's apraxia of speech,she was incorrectly repeating the words. However, when words were removed, pt's accuracy in selecting colors as well as fruits improved greatly.   Colors from field of 3 - 90% Fruits from field of 3 - 67% improving to 100% when listening to incorrect choice  Speech intelligibility Pt able to count pennies 1-10 and then 1-20 with > 90% speech intelligibility Pt also able to count specified number of pennies (show ___) with 100% accuracy  With max faded to moderate multimodal assistance, pt able to read and say DOW with > 90% accuracy. When arranged out of order, pt able to put in order correctly x 3.   PATIENT EDUCATION: Education details: requesting trial device from  [place] Person educated: Patient, Child(ren), and Caregiver Education method: Explanation, Demonstration, and Verbal cues Education comprehension: needs further education   GOALS: Goals reviewed with patient? YES  SHORT TERM GOALS: Target date: 10 sessions  Given maximal multimodal cues, pt will increase speech approximation by repeating rote conversational words with 50% intelligibility. Baseline: unintelligible despite maximal cues Goal status: INITIAL  2.  Pt will use a SGD to answer simple biographical questions at 50% accuracy given frequent maximal verbal and maximal visual cues. Baseline: no accuracy Goal status: INITIAL  3.  Pt will repeat single words using a speech generating device in 50% of opportunities given frequent maximal verbal and maximal visual cues. Baseline: new goal Goal status: INITIAL   LONG TERM GOALS: Target date: 10/15/2021    Using multimodal communication, pt will express basic wants and needs in 8 out of 10 opportunities.  Baseline: unable Goal status: INITIAL  2.  Using multimodal communication, pt will answer basic questions in 8 out of 10 opportunities.  Baseline: unable Goal status: INITIAL  ASSESSMENT:  CLINICAL IMPRESSION: Pt presents with promising ability to use speech generating device to in functional communication. Will request trial device from [employer].   OBJECTIVE IMPAIRMENTS include expressive language, receptive language, aphasia, and dysarthria. These impairments are limiting patient from return to work, managing medications, managing appointments, managing finances, household responsibilities, ADLs/IADLs, and effectively communicating at home and in community. Factors affecting potential to achieve goals and functional outcome are severity of impairments. Patient will benefit from skilled SLP services to address above impairments and improve overall function.  REHAB POTENTIAL: Excellent  PLAN: SLP FREQUENCY:  2x/week  SLP DURATION: 12 weeks  PLANNED INTERVENTIONS: Language facilitation, Cueing hierachy, Internal/external aids, Functional tasks, Multimodal communication approach, and SLP instruction and feedback   Esperanza Madrazo B. Dreama Saa, M.S., CCC-SLP, Tree surgeon Certified Brain Injury Specialist [facility]  [facility] Rehabilitation Services Office [phone] Ascom [phone] Fax [phone]

## 2021-07-29 ENCOUNTER — Other Ambulatory Visit: Payer: Self-pay

## 2021-08-01 ENCOUNTER — Ambulatory Visit: Payer: Medicaid Other | Admitting: Occupational Therapy

## 2021-08-01 ENCOUNTER — Encounter: Payer: Self-pay | Admitting: Occupational Therapy

## 2021-08-01 ENCOUNTER — Ambulatory Visit: Payer: Medicaid Other | Attending: Neurology | Admitting: Speech Pathology

## 2021-08-01 DIAGNOSIS — R262 Difficulty in walking, not elsewhere classified: Secondary | ICD-10-CM | POA: Diagnosis not present

## 2021-08-01 DIAGNOSIS — R2681 Unsteadiness on feet: Secondary | ICD-10-CM | POA: Insufficient documentation

## 2021-08-01 DIAGNOSIS — M6281 Muscle weakness (generalized): Secondary | ICD-10-CM

## 2021-08-01 DIAGNOSIS — R278 Other lack of coordination: Secondary | ICD-10-CM | POA: Insufficient documentation

## 2021-08-01 DIAGNOSIS — R4701 Aphasia: Secondary | ICD-10-CM | POA: Insufficient documentation

## 2021-08-01 DIAGNOSIS — I63532 Cerebral infarction due to unspecified occlusion or stenosis of left posterior cerebral artery: Secondary | ICD-10-CM | POA: Diagnosis present

## 2021-08-01 DIAGNOSIS — R482 Apraxia: Secondary | ICD-10-CM | POA: Insufficient documentation

## 2021-08-01 NOTE — Therapy (Signed)
OUTPATIENT SPEECH LANGUAGE PATHOLOGY TREATMENT NOTE  Patient Name: Cheyenne Martinez MRN: [id number] DOB:1975/08/28, 46 y.o., female Today's Date: 08/01/2021  PCP: No PCP REFERRING PROVIDER: Theora Master, MD   End of Session - 08/01/21 1256     Visit Number 4    Number of Visits 25    Date for SLP Re-Evaluation 10/13/21    Authorization Type [place] [employer]    Authorization Time Period 08/01/2021 thru 11/29/2021    Authorization - Visit Number 1    Authorization - Number of Visits 16    SLP Start Time 1300    SLP Stop Time  1400    SLP Time Calculation (min) 60 min    Activity Tolerance Patient tolerated treatment well             No past medical history on file. No past surgical history on file. 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 Evolving early subacute left PCA distribution infarct as above,  with additional scattered small volume ischemic changes within the  left frontotemporal and right parietal lobe as above. Associated  mild petechial blood products at the left occipital lobe without  frank hemorrhagic transformation or significant mass effect.  2. Underlying mild chronic microvascular ischemic disease with  chronic right cerebellar infarct.    07/04/2021 CTA  HEAD NECK W WO CONTRAST  IMPRESSION:  Evolving recent infarcts as seen on recent MRI. There is likely mild  petechial hemorrhage associated with the left parieto-occipital  infarct. No significant mass effect.  Plaque at the left greater than right ICA origins without  hemodynamically significant stenosis. Marked stenosis at the right  vertebral origin without apparent flow limitation.  Intracranial atherosclerosis involving anterior and posterior  circulations. No proximal intracranial vessel occlusion.    TODAY'S TREATMENT:  Skilled treatment session focused on pt's expressive and receptive language abilities. SLP facilitated session by providing the following interventions:  Using therapy activities on SGD pt able to: Complete common phrases: Easy 1: 100% Easy 2: 100% Phrases medium 1: 90% Phrases Medium 2: 90% Phrases Hard 1: 90% Phrases Hard 2: 90%  Subject - Verb: Level 1: 50% improving to 85% with moderate verbal cues  Addition: Add by 1: 50% improving to 100% with pennies to count    PATIENT EDUCATION: Education details: requesting trial device from [place] Person educated: Patient, Child(ren), and Caregiver Education method: Explanation, Demonstration, and Verbal cues Education comprehension: needs further education   GOALS: Goals reviewed with patient? YES  SHORT TERM GOALS: Target date: 10 sessions  Given maximal multimodal cues, pt will increase speech approximation by repeating rote conversational words with 50% intelligibility. Baseline: unintelligible despite maximal cues Goal status: INITIAL  2.  Pt will use a SGD to answer simple biographical questions at 50% accuracy given frequent maximal verbal and maximal visual cues. Baseline: no accuracy  Goal status: INITIAL  3.  Pt will repeat single words using a speech generating device in 50% of opportunities given frequent maximal verbal and maximal visual cues. Baseline: new goal Goal status:  INITIAL   LONG TERM GOALS: Target date: 10/15/2021    Using multimodal communication, pt will express basic wants and needs in 8 out of 10 opportunities.  Baseline: unable Goal status: INITIAL  2.  Using multimodal communication, pt will answer basic questions in 8 out of 10 opportunities.  Baseline: unable Goal status: INITIAL  ASSESSMENT:  CLINICAL IMPRESSION: Pt presents without iPad or pictures of requested items to load on SGD. SLP has submitted paperwork for trial device with [employer].   OBJECTIVE IMPAIRMENTS include expressive language, receptive language, aphasia, and dysarthria. These impairments are limiting patient from return to work, managing medications, managing appointments, managing finances, household responsibilities, ADLs/IADLs, and effectively communicating at home and in community. Factors affecting potential to achieve goals and functional outcome are severity of impairments. Patient will benefit from skilled SLP services to address above impairments and improve overall function.  REHAB POTENTIAL: Excellent  PLAN: SLP FREQUENCY: 2x/week  SLP DURATION: 12 weeks  PLANNED INTERVENTIONS: Language facilitation, Cueing hierachy, Internal/external aids, Functional tasks, Multimodal communication approach, and SLP instruction and feedback   Ireland Virrueta B. Dreama Saa, M.S., CCC-SLP, Tree surgeon Certified Brain Injury Specialist [facility]  [facility] Rehabilitation Services Office [phone] Ascom [phone] Fax [phone]

## 2021-08-01 NOTE — Therapy (Addendum)
OUTPATIENT OCCUPATIONAL THERAPY TREATMENT NOTE   Patient Name: Cheyenne Martinez AWE MRN: [id number] DOB:November 27, 1975, 46 y.o., female Today's Date: 08/01/2021  REFERRING PROVIDER: Theora Master, MD   OT End of Session - 08/01/21 1821     Visit Number 3    Number of Visits 24    Date for OT Re-Evaluation 10/09/21    Authorization Time Period Reporting period beginning 07/18/21    OT Start Time 1425    OT Stop Time 1510    OT Time Calculation (min) 45 min    Activity Tolerance Patient tolerated treatment well    Behavior During Therapy Restless;Impulsive             History reviewed. No pertinent past medical history. History reviewed. No pertinent surgical history. Patient Active Problem List   Diagnosis Date Noted   CVA (cerebral vascular accident) (HCC) 07/01/2021   New onset type 2 diabetes mellitus (HCC) 07/01/2021   Dyslipidemia 07/01/2021   Hypertensive urgency 07/01/2021      REFERRING DIAG: CVA  THERAPY DIAG:  Muscle weakness (generalized)  Other lack of coordination  Rationale for Evaluation and Treatment Rehabilitation  PERTINENT HISTORY: 46 y.o. African-American female with no chronic medical problems, who presented to the ER with acute onset of altered mental status and difficulty finding words with occasional slurred speech over the last couple of days.  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 Associated mild petechial blood products at the left occipital lobe without frank hemorrhagic transformation or significant mass effect. 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 Patient also has new diagnosis of hypertension and diabetes with A1c of 11.2.  Lipid profile with elevated cholesterol at 238, triglyceride 159, HDL 39 and LDL 169 with goal less than  70.  PRECAUTIONS: None  SUBJECTIVE:  Pt. was present for therapy with her Mother, [name].  PAIN:  Are you having pain? No  OBJECTIVE:   TODAY'S TREATMENT:   Pt. tolerated RUE AAROM for right scapular elevation, shoulder flexion, abduction, horizontal abduction, and adduction. Pt.t olerated AROM reps for forearm supination, pronation, wrist extension, digit flexion, and extension at the tabletop with resistance, thumb palmar, and radial abduction. Pt. Pt. Worked on bilateral hand use grasping, and stabilizing various sized small jars with the left hand while  attempting to open them with her right hand. Pt. Performed lateral pinch using a yellow resistive clip, upgraded to a red resistive clip. Pt. worked on using her right hand to grasp, place, and stack [place] style pegs, performing 2 trials. Pt. worked on grasping the [place] pegs with the right hand, and reaching to the far right to place them into a container. Pt. Worked on  grasping 1 " resistive cubes removing them from a pegboard.   Pt. Was with with mother today. Pt./caregiver education was provided about stopping theraputty use if Pt. Is having pain. Education was provided RUE ROM, as well as about opportunities to engage the right hand during daily tasks at home. Pt. reports no pain today during the session. Pt. required cues for visual demonstration for each task. Pt tolerated ROM well, and tolerated all tasks attempted with the RUE. Pt. was able to stabilize jars with the left hand, and work on the movements required for opening them with her right hand, however required cues for motor planning through the movements.  Pt. was able  to stack a set of 4 [place] style discs in the 1st trial, and 3 in the 2nd. Pt. Required cues for rest breaks. Pt. Continues to present with impaired motor control, limited digit extension, and thumb abduction. Pt. Continues to work on improving UE strength, and [facility] skills in order to work towards improving  RUE functioning, and maximizing engagement of the RUE, and overall independence with ADLs, and IADL tasks.   PATIENT EDUCATION: Education details: Pt.caregiver education about  RUE ROM, and opportunities for engagement of the RUE, and hand during ADL/IADL tasks. Person educated: Patient and Caregiver Education method: Explanation, Demonstration, and Verbal cues Education comprehension: verbalized understanding, returned demonstration, verbal cues required, and needs further education   HOME EXERCISE PROGRAM Pt. currently has a HEP for right hand strengthening with light blue theraputty. Pt./brother education about daily UE ROM.   OT Short Term Goals - 07/18/21 1444       OT SHORT TERM GOAL #1   Title Pt will perform RUE HEP for increasing strength and coordination with min vc.    Baseline Eval: initiated at eval; requires mod A with gripping and rolling putty    Time 6    Period Weeks    Status New    Target Date 08/28/21      OT SHORT TERM GOAL #2   Title Family will provide min vc to increase attention to R side for pt to more easily navigate her environment.    Baseline Eval: initiated education at eval as family was unaware of any visual decline (pt presents with mild R sided inattention and possible R field cut)    Time 6    Period Weeks    Status New    Target Date 08/28/21              OT Long Term Goals - 07/18/21 1447       OT LONG TERM GOAL #1   Title Pt will increase FOTO score to 45 or better to indicate perceived increased performance with daily tasks.    Baseline Eval: FOTO 36    Time 12    Period Weeks    Status New    Target Date 10/09/21      OT LONG TERM GOAL #2   Title Pt will increase RUE MMT by 1 muscle grade for improved engagement of RUE into self care tasks.    Baseline Eval: Pt using L non-dominant arm for all daily tasks; R shoulder grossly 3-, elbow 4-, wrist 3+    Time 12    Period Weeks    Status New    Target Date 10/09/21      OT  LONG TERM GOAL #3   Title Pt will increase R grip strength by 7# to enable pt to hold and carry light ADL supplies in R dominant hand.    Baseline Eval: R 0, L 72    Time 12    Period Weeks    Status New    Target Date 10/09/21      OT LONG TERM GOAL #4   Title Pt will write name with R dominant hand with 50% legibility    Baseline Eval: pt can hold pen in R hand with OT assisting with set up, but pt can not make a mark on the paper    Time 12    Period Weeks    Status New    Target Date 10/09/21      OT LONG TERM  GOAL #5   Title Pt will improve R hand dexterity for picking up pills with dominant hand.    Baseline Eval: R 9 hole unable (uses L non-dominant hand to pick up pills)    Time 12    Period Weeks    Status New    Target Date 10/09/21              Plan - 07/28/21 1149     Clinical Impression Statement Pt. Was with with mother today. Pt./caregiver education was provided about stopping theraputty use if Pt. Is having pain. Education was provided RUE ROM, as well as about opportunities to engage the right hand during daily tasks at home. Pt. reports no pain today during the session. Pt. required cues for visual demonstration for each task. Pt tolerated ROM well, and tolerated all tasks attempted with the RUE. Pt. was able to stabilize jars with the left hand, and work on the movements required for opening them with her right hand, however required cues for motor planning through the movements.  Pt. was able to stack a set of 4 [place] style discs in the 1st trial, and 3 in the 2nd. Pt. Required cues for rest breaks. Pt. Continues to present with impaired motor control, limited digit extension, and thumb abduction. Pt. Continues to work on improving UE strength, and [facility] skills in order to work towards improving RUE functioning, and maximizing engagement of the RUE, and overall independence with ADLs, and IADL tasks.    OT Occupational Profile and History Problem Focused  Assessment - Including review of records relating to presenting problem    Occupational performance deficits (Please refer to evaluation for details): ADL's;IADL's;Work;Leisure    Body Structure / Function / Physical Skills ADL;GMC;ROM;UE functional use;Balance;Endurance;FMC;Pain;Strength;Coordination;Dexterity;Gait;IADL;Sensation;Tone;Vision    Cognitive Skills Attention;Safety Awareness;Understand;Emotional;Temperament/Personality    Rehab Potential Good    Clinical Decision Making Several treatment options, min-mod task modification necessary    Comorbidities Affecting Occupational Performance: None    Modification or Assistance to Complete Evaluation  Min-Moderate modification of tasks or assist with assess necessary to complete eval    OT Frequency 2x / week    OT Duration 12 weeks    OT Treatment/Interventions Self-care/ADL training;Therapeutic exercise;Building services engineer;Therapeutic activities;Visual/perceptual remediation/compensation;Balance training;Coping strategies training;Cryotherapy;Moist Heat;Neuromuscular education;DME and/or AE instruction;Manual Therapy;Passive range of motion;Patient/family education    OT Home Exercise Plan Initiated theraputty exercises for gripping and rolling putty with RUE    Consulted and Agree with Plan of Care Patient;Family member/caregiver    Family Member Consulted Mother, [name] and daughter, Newton Pigg, MS, OTR/L   Olegario Messier, [place] 08/01/2021, 6:24 PM

## 2021-08-03 ENCOUNTER — Ambulatory Visit: Payer: Medicaid Other

## 2021-08-03 ENCOUNTER — Ambulatory Visit: Payer: Medicaid Other | Admitting: Occupational Therapy

## 2021-08-03 ENCOUNTER — Other Ambulatory Visit: Payer: Self-pay

## 2021-08-03 ENCOUNTER — Ambulatory Visit: Payer: Medicaid Other | Admitting: Speech Pathology

## 2021-08-03 DIAGNOSIS — M6281 Muscle weakness (generalized): Secondary | ICD-10-CM

## 2021-08-03 DIAGNOSIS — R262 Difficulty in walking, not elsewhere classified: Secondary | ICD-10-CM

## 2021-08-03 DIAGNOSIS — R278 Other lack of coordination: Secondary | ICD-10-CM

## 2021-08-03 DIAGNOSIS — R4701 Aphasia: Secondary | ICD-10-CM | POA: Diagnosis not present

## 2021-08-03 DIAGNOSIS — R482 Apraxia: Secondary | ICD-10-CM

## 2021-08-03 DIAGNOSIS — I63532 Cerebral infarction due to unspecified occlusion or stenosis of left posterior cerebral artery: Secondary | ICD-10-CM

## 2021-08-03 LAB — GLUCOSE, CAPILLARY: Glucose-Capillary: 158 mg/dL — ABNORMAL HIGH (ref 70–99)

## 2021-08-03 NOTE — Therapy (Signed)
OUTPATIENT PHYSICAL THERAPY NEURO TREATMENT NOTE   Patient Name: Cheyenne Martinez MRN: [id number] DOB:1976/01/03, 46 y.o., female Today's Date: 08/03/2021   PCP: Morene Crocker MD REFERRING PROVIDER: Morene Crocker MD   PT End of Session - 08/03/21 1017     Visit Number 3    Number of Visits 24    Date for PT Re-Evaluation 10/12/21    Authorization Type 1/10 eval 07/20/21    PT Start Time 1017    PT Stop Time 1100    PT Time Calculation (min) 43 min    Equipment Utilized During Treatment Gait belt    Activity Tolerance Patient tolerated treatment well    Behavior During Therapy Restless;Impulsive              History reviewed. No pertinent past medical history. History reviewed. No pertinent surgical history. 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 Pt's mother reports concerns that Maverick's blood sugar may be high. No aches/pains currently.   Pt accompanied by: family member, mother [name]  PERTINENT HISTORY: Patient is a 46 year old female who presented to ER with AMS on 07/01/21; MRI showed early  subacute left PCA distribution infarct with ischemic changes within the left frontotemporal and right parietal lobe. Associated mild petechial blood products at the left occipital lobe without frank hemorrhagic transformation or significant mass effect. 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     Patient also has new diagnosis of hypertension and diabetes. Per patient's evaluation with OT. Patient's daughter is now living with patient s/p stroke. Patient is very aphasic.  Patient has had one fall. Patient was working at [employer] and independent prior to stroke. Patient is walking  without anything.   PAIN:  Are you having pain? No  PRECAUTIONS: Fall  WEIGHT BEARING RESTRICTIONS: No  PLOF: Independent  PATIENT GOALS: to get stronger and use RLE   OBJECTIVE:  The following measure were taken from previous session notes unless otherwise noted.  DIAGNOSTIC FINDINGS: MRI showed early subacute left PCA distribution infarct with ischemic changes within the left frontotemporal and right parietal lobe. Associated mild petechial blood products at the left occipital lobe without frank hemorrhagic transformation or significant mass effect. 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  COGNITION: Overall cognitive status: Impaired   SENSATION: Light touch: Impaired  on LLE  COORDINATION: Impaired on LLE, limited coordination and spatial awareness.    TODAY'S TREATMENT:   08/03/2021:   Seated LUE: BP 121/89 mmHg HR 89 Blood sugar: 158   Comments: nursing present and administered blood sugar check secondary to pt's caregiver addressing concerns that it may be high, 8 minutes total    Alternating LAQ:  20x, 5 sec hold   Seated march: 20x, alternating LE, pt reports as tiring   STS: 2 x 10, cues for foot placement  Weigth shifting: standing in front of support  surface, multiple reps, BUE support, frequent tactile cues and CGA-min A from SPT for weight shift onto RLE   Seated crunches: reclined in chair, 2 x 10, pt rates hard  Ambulation without & with quad cane: ~20 meters each, 3-4 LOB noted when ambulating without cane (SPT provided min A to correct) as a result of R foot drag, no LOB noted when using cane and only minor cues needed for sequencing at the start       PATIENT EDUCATION: Education details: Pt educated throughout session about proper posture and technique with exercises. Improved exercise technique, movement at target joints, use of target muscles after min to mod verbal, visual, tactile cues.  Person educated: Patient and Caregiver Education method: Explanation, Demonstration, Tactile cues, and Verbal cues Education comprehension: returned demonstration, verbal cues required, tactile cues required, and needs further education   HOME EXERCISE PROGRAM:  No updates as of 08/03/21  Access Code: [email or web address] URL: [email or web address] Date: 07/20/2021 Prepared by: Precious Bard  Exercises - Leg Extension  - 1 x daily - 7 x weekly - 2 sets - 10 reps - 5 hold - Seated March  - 1 x daily - 7 x weekly - 2 sets - 10 reps - 5 hold - Seated Hip Abduction  - 1 x daily - 7 x weekly - 2 sets - 10 reps - 5 hold - Seated Hip Adduction Isometrics with Ball  - 1 x daily - 7 x weekly - 2 sets - 10 reps - 5 hold    GOALS: Goals reviewed with patient? Yes  SHORT TERM GOALS: Target date: 08/17/2021  Patient will be independent in home exercise program to improve strength/mobility for better functional independence with ADLs. Baseline: 6/21: HEP given  Goal status: INITIAL  2.  Patient will deny any falls over past 4 weeks to demonstrate improved safety awareness at home and work.  Baseline: 6/21: 1 fall  Goal status: INITIAL  LONG TERM GOALS: Target date: 10/12/2021  Patient will increase FOTO score to equal to or greater than   68   to demonstrate statistically significant improvement in mobility and quality of life.  Baseline: 6/21: 49 Goal status: INITIAL  2.   Patient (< 56 years old) will complete five times sit to stand  test in < 10 seconds indicating an increased LE strength and improved balance Baseline: 6/21: 20 seconds with heavy LUE support Goal status: INITIAL  3.  Patient will increase 10 meter walk test to >1.33m/s as to improve gait speed for better community ambulation and to reduce fall risk. Baseline: 6/21 : 26 seconds with 3 LOB  Goal status: INITIAL  4.  Patient will increase BLE gross strength to 4+/5 as to improve functional strength for independent gait, increased standing tolerance and increased ADL ability. Baseline: 6/21: see note Goal status: INITIAL   ASSESSMENT:  CLINICAL IMPRESSION:  Session time limited today secondary to vitals check as start of session, see today's treatment session for details. Continued focus on strengthening and ambulation. Pt still requires frequent cueing with interventions. Pt demonstrated increased instability and multiple moderate LOB when ambulating without AD, however demonstrates improved balance and demonstrated no LOB with quad cane. SPT and PT educated pt and caregiver on the benefits of using an AD when the pt is ambulating. Instructed pt and caregiver to reach out to PCP for script for AD for safety with in home and small distance community ambulation. Pt and caregiver verbalized understanding and agreement with plan. The pt will continue to benefit from skilled PT to increase strength, improve gait, and improve QoL and independence with ADLs.  OBJECTIVE IMPAIRMENTS Abnormal gait, decreased activity tolerance, decreased balance, decreased cognition, decreased coordination, decreased endurance, decreased knowledge of condition, decreased knowledge of use of DME, decreased mobility, difficulty walking, decreased strength, decreased safety awareness, and  increased fascial restrictions.   ACTIVITY LIMITATIONS carrying, lifting, bending, standing, squatting, stairs, transfers, hygiene/grooming, locomotion level, and caring for others  PARTICIPATION LIMITATIONS: cleaning, laundry, interpersonal relationship, driving, shopping, community activity, occupation, yard work, and church  PERSONAL FACTORS Age, Behavior pattern, Education, Past/current experiences, Social background, Time since onset of injury/illness/exacerbation, Transportation, and 3+ comorbidities: CVA, HTN, dyslipidemia, DM type II  are also affecting patient's functional outcome.   REHAB POTENTIAL: Fair    CLINICAL DECISION MAKING: Evolving/moderate complexity  EVALUATION COMPLEXITY: Moderate  PLAN: PT FREQUENCY: 2x/week  PT DURATION: 12 weeks  PLANNED INTERVENTIONS: Therapeutic exercises, Therapeutic activity, Neuromuscular re-education, Balance training, Gait training, Patient/Family education, Joint mobilization, Stair training, Vestibular training, Canalith repositioning, Orthotic/Fit training, DME instructions, Cognitive remediation, Electrical stimulation, Cryotherapy, Moist heat, Splintting, Taping, Vasopneumatic device, Traction, Ultrasound, Biofeedback, and Manual therapy  PLAN FOR NEXT SESSION: strengthening, endurance, and gait interventions    Avon Gully, SPT  This entire session was performed under direct supervision and direction of a licensed therapist. I have personally read, edited and approve of the note as written. Temple Pacini PT, DPT    08/03/2021, 2:01 PM

## 2021-08-03 NOTE — Therapy (Signed)
OUTPATIENT SPEECH LANGUAGE PATHOLOGY TREATMENT NOTE  Patient Name: Cheyenne Martinez MRN: [id number] DOB:Dec 18, 1975, 46 y.o., female Today's Date: 08/03/2021  PCP: No PCP REFERRING PROVIDER: Theora Master, MD   End of Session - 08/03/21 1256     Visit Number 5    Number of Visits 25    Date for SLP Re-Evaluation 10/13/21    Authorization Type  Medicaid Prepaid [employer]    Authorization Time Period 08/01/2021 thru 11/29/2021    Authorization - Visit Number 2    Authorization - Number of Visits 16    Progress Note Due on Visit 10    SLP Start Time 1230    SLP Stop Time  1315    SLP Time Calculation (min) 45 min    Activity Tolerance Patient tolerated treatment well             No past medical history on file. No past surgical history on file. 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 Evolving early subacute left PCA distribution infarct as above,  with additional scattered small volume ischemic changes within the  left frontotemporal and right parietal lobe as above. Associated  mild petechial blood products at the left occipital lobe without  frank hemorrhagic transformation or significant mass effect.  2. Underlying mild chronic microvascular ischemic disease with  chronic right  cerebellar infarct.    07/04/2021 CTA HEAD NECK W WO CONTRAST  IMPRESSION:  Evolving recent infarcts as seen on recent MRI. There is likely mild  petechial hemorrhage associated with the left parieto-occipital  infarct. No significant mass effect.  Plaque at the left greater than right ICA origins without  hemodynamically significant stenosis. Marked stenosis at the right  vertebral origin without apparent flow limitation.  Intracranial atherosclerosis involving anterior and posterior  circulations. No proximal intracranial vessel occlusion.    TODAY'S TREATMENT:  Skilled treatment session focused on pt's expressive and receptive language abilities. SLP facilitated session by providing the following interventions:  Utilized Constant Therapy Clinician App to target reading and receptive language Understand words you hear:  Level 1: 95% accuracy with repetition of cues Level 2: 100% accuracy with repetition of cues Level 3: 78% accuracy with repetition of cues - decreased accuracy likely related to fatigue  SLP also downloaded the [employer] Aphasia Female App - with pt demonstrating fair ability to navigate with question cues  SLP also had pt's mother sent via email pt's medicines and pictures of her children.    PATIENT EDUCATION: Education details: requesting trial device from [place] Person educated: Patient, Child(ren), and Caregiver Education method: Explanation, Demonstration, and Verbal cues Education comprehension: needs further education   GOALS: Goals reviewed with patient? YES  SHORT TERM GOALS: Target date: 10 sessions  Given maximal multimodal cues, pt will increase speech approximation by repeating rote conversational words with 50% intelligibility.  Baseline: unintelligible despite maximal cues Goal status: INITIAL  2.  Pt will use a SGD to answer simple biographical questions at 50% accuracy given frequent maximal verbal and maximal visual  cues. Baseline: no accuracy Goal status: INITIAL  3.  Pt will repeat single words using a speech generating device in 50% of opportunities given frequent maximal verbal and maximal visual cues. Baseline: new goal Goal status: INITIAL   LONG TERM GOALS: Target date: 10/15/2021    Using multimodal communication, pt will express basic wants and needs in 8 out of 10 opportunities.  Baseline: unable Goal status: INITIAL  2.  Using multimodal communication, pt will answer basic questions in 8 out of 10 opportunities.  Baseline: unable Goal status: INITIAL  ASSESSMENT:  CLINICAL IMPRESSION: Pt presents with improved ability to answer yes/no questions related to yourself. She also continues with good ability to problem solve each task and doesn't require extensive examples of task which allows for increased therapeutic trials.   OBJECTIVE IMPAIRMENTS include expressive language, receptive language, aphasia, and dysarthria. These impairments are limiting patient from return to work, managing medications, managing appointments, managing finances, household responsibilities, ADLs/IADLs, and effectively communicating at home and in community. Factors affecting potential to achieve goals and functional outcome are severity of impairments. Patient will benefit from skilled SLP services to address above impairments and improve overall function.  REHAB POTENTIAL: Excellent  PLAN: SLP FREQUENCY: 2x/week  SLP DURATION: 12 weeks  PLANNED INTERVENTIONS: Language facilitation, Cueing hierachy, Internal/external aids, Functional tasks, Multimodal communication approach, and SLP instruction and feedback   Brittinee Risk B. Dreama Saa, M.S., CCC-SLP, Tree surgeon Certified Brain Injury Specialist [facility]  [facility] Rehabilitation Services Office [phone] Ascom [phone] Fax [phone]

## 2021-08-04 NOTE — Therapy (Signed)
OUTPATIENT OCCUPATIONAL THERAPY TREATMENT NOTE   Patient Name: Cheyenne Martinez MRN: [id number] DOB:Mar 10, 1975, 46 y.o., female Today's Date: 08/04/2021  REFERRING PROVIDER: Theora Master, MD   OT End of Session - 08/04/21 1044     Visit Number 4    Date for OT Re-Evaluation 10/09/21    Authorization Time Period Reporting period beginning 07/18/21    OT Start Time 1145    OT Stop Time 1225    OT Time Calculation (min) 40 min    Activity Tolerance Patient tolerated treatment well    Behavior During Therapy Restless;Impulsive             No past medical history on file. No past surgical history on file. Patient Active Problem List   Diagnosis Date Noted   CVA (cerebral vascular accident) (HCC) 07/01/2021   New onset type 2 diabetes mellitus (HCC) 07/01/2021   Dyslipidemia 07/01/2021   Hypertensive urgency 07/01/2021      REFERRING DIAG: CVA  THERAPY DIAG:  Muscle weakness (generalized)  Other lack of coordination  Rationale for Evaluation and Treatment Rehabilitation  PERTINENT HISTORY: 46 y.o. African-American female with no chronic medical problems, who presented to the ER with acute onset of altered mental status and difficulty finding words with occasional slurred speech over the last couple of days.  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 Associated mild petechial blood products at the left occipital lobe without frank hemorrhagic transformation or significant mass effect. 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 Patient also has new diagnosis of hypertension and diabetes with A1c of 11.2.  Lipid profile with elevated cholesterol at 238, triglyceride 159, HDL 39 and LDL 169 with goal less than 70.  PRECAUTIONS: None  SUBJECTIVE:  Pt. was present for  therapy with her Mother, [name].  PAIN:  Are you having pain? No  OBJECTIVE:   TODAY'S TREATMENT:   Pt. tolerated RUE AAROM for right scapular elevation, shoulder flexion, abduction, horizontal abduction, and adduction. Pt.t olerated AROM r pt. Worked on reaching to [employer] with emphasis on gross digit extension. Pt. Performed lateral pinch using a yellow resistive clip, upgraded to a red resistive clip. Pt. Worked on reaching out to place the resistive clips onto a horizontal dowel. Pt. worked on using her right hand to grasp, place, and stack [place] style pegs, performing 2 trials. Pt. worked on grasping the [place] pegs with the right hand, and reaching to the far right to place them into a container. Pt. Worked on  grasping 1 " resistive cubes removing them from a pegboard.   Pt. Was with with mother today. Education was provided RUE ROM, as well as about opportunities to engage the right hand during daily tasks at home. Pt. reports no pain today during the session. Pt. required cues for visual demonstration for each task. Pt tolerated ROM well, and tolerated all tasks attempted with the RUE. Pt. was able to stack a set of 5 [place] style discs  for 3 trials. Pt. Was able to remove each [place] peg from each stack when the base of each stack are stabilized. Pt. Continues to require cues for rest breaks. Pt. Continues to present with impaired motor control, limited digit extension, and thumb abduction. Pt. Continues to work on improving UE strength, and [facility] skills in order to work towards improving RUE functioning, and maximizing  engagement of the RUE, and overall independence with ADLs, and IADL tasks.   PATIENT EDUCATION: Education details: Pt.caregiver education about  RUE ROM, and opportunities for engagement of the RUE, and hand during ADL/IADL tasks. Person educated: Patient and Caregiver Education method: Explanation, Demonstration, and Verbal cues Education comprehension:  verbalized understanding, returned demonstration, verbal cues required, and needs further education   HOME EXERCISE PROGRAM Pt. currently has a HEP for right hand strengthening with light blue theraputty. Pt./brother education about daily UE ROM.   OT Short Term Goals - 07/18/21 1444       OT SHORT TERM GOAL #1   Title Pt will perform RUE HEP for increasing strength and coordination with min vc.    Baseline Eval: initiated at eval; requires mod A with gripping and rolling putty    Time 6    Period Weeks    Status New    Target Date 08/28/21      OT SHORT TERM GOAL #2   Title Family will provide min vc to increase attention to R side for pt to more easily navigate her environment.    Baseline Eval: initiated education at eval as family was unaware of any visual decline (pt presents with mild R sided inattention and possible R field cut)    Time 6    Period Weeks    Status New    Target Date 08/28/21              OT Long Term Goals - 07/18/21 1447       OT LONG TERM GOAL #1   Title Pt will increase FOTO score to 45 or better to indicate perceived increased performance with daily tasks.    Baseline Eval: FOTO 36    Time 12    Period Weeks    Status New    Target Date 10/09/21      OT LONG TERM GOAL #2   Title Pt will increase RUE MMT by 1 muscle grade for improved engagement of RUE into self care tasks.    Baseline Eval: Pt using L non-dominant arm for all daily tasks; R shoulder grossly 3-, elbow 4-, wrist 3+    Time 12    Period Weeks    Status New    Target Date 10/09/21      OT LONG TERM GOAL #3   Title Pt will increase R grip strength by 7# to enable pt to hold and carry light ADL supplies in R dominant hand.    Baseline Eval: R 0, L 72    Time 12    Period Weeks    Status New    Target Date 10/09/21      OT LONG TERM GOAL #4   Title Pt will write name with R dominant hand with 50% legibility    Baseline Eval: pt can hold pen in R hand with OT assisting  with set up, but pt can not make a mark on the paper    Time 12    Period Weeks    Status New    Target Date 10/09/21      OT LONG TERM GOAL #5   Title Pt will improve R hand dexterity for picking up pills with dominant hand.    Baseline Eval: R 9 hole unable (uses L non-dominant hand to pick up pills)    Time 12    Period Weeks    Status New    Target Date 10/09/21  Plan - 07/28/21 1149     Clinical Impression Statement Pt. Was with with mother today. Education was provided RUE ROM, as well as about opportunities to engage the right hand during daily tasks at home. Pt. reports no pain today during the session. Pt. required cues for visual demonstration for each task. Pt tolerated ROM well, and tolerated all tasks attempted with the RUE. Pt. was able to stack a set of 5 [place] discs  for 3 trials. Pt. Was able to remove each [place] peg from each stack when the base of each stack are stabilized. Pt. Continues to require cues for rest breaks. Pt. Continues to present with impaired motor control, limited digit extension, and thumb abduction. Pt. Continues to work on improving UE strength, and [facility] skills in order to work towards improving RUE functioning, and maximizing engagement of the RUE, and overall independence with ADLs, and IADL tasks.     OT Occupational Profile and History Problem Focused Assessment - Including review of records relating to presenting problem    Occupational performance deficits (Please refer to evaluation for details): ADL's;IADL's;Work;Leisure    Body Structure / Function / Physical Skills ADL;GMC;ROM;UE functional use;Balance;Endurance;FMC;Pain;Strength;Coordination;Dexterity;Gait;IADL;Sensation;Tone;Vision    Cognitive Skills Attention;Safety Awareness;Understand;Emotional;Temperament/Personality    Rehab Potential Good    Clinical Decision Making Several treatment options, min-mod task modification necessary    Comorbidities Affecting  Occupational Performance: None    Modification or Assistance to Complete Evaluation  Min-Moderate modification of tasks or assist with assess necessary to complete eval    OT Frequency 2x / week    OT Duration 12 weeks    OT Treatment/Interventions Self-care/ADL training;Therapeutic exercise;Building services engineer;Therapeutic activities;Visual/perceptual remediation/compensation;Balance training;Coping strategies training;Cryotherapy;Moist Heat;Neuromuscular education;DME and/or AE instruction;Manual Therapy;Passive range of motion;Patient/family education    OT Home Exercise Plan Initiated theraputty exercises for gripping and rolling putty with RUE    Consulted and Agree with Plan of Care Patient;Family member/caregiver    Family Member Consulted Mother, [name] and daughter, Newton Pigg, [place], OTR/L   Olegario Messier, OT 08/04/2021, 10:48 AM

## 2021-08-05 ENCOUNTER — Other Ambulatory Visit: Payer: Self-pay

## 2021-08-08 ENCOUNTER — Ambulatory Visit: Payer: Medicaid Other | Admitting: Speech Pathology

## 2021-08-08 ENCOUNTER — Ambulatory Visit: Payer: Medicaid Other | Admitting: Occupational Therapy

## 2021-08-08 ENCOUNTER — Encounter: Payer: Self-pay | Admitting: Occupational Therapy

## 2021-08-08 DIAGNOSIS — M6281 Muscle weakness (generalized): Secondary | ICD-10-CM

## 2021-08-08 DIAGNOSIS — R4701 Aphasia: Secondary | ICD-10-CM | POA: Diagnosis not present

## 2021-08-08 DIAGNOSIS — I63532 Cerebral infarction due to unspecified occlusion or stenosis of left posterior cerebral artery: Secondary | ICD-10-CM

## 2021-08-08 DIAGNOSIS — R278 Other lack of coordination: Secondary | ICD-10-CM

## 2021-08-08 DIAGNOSIS — R482 Apraxia: Secondary | ICD-10-CM

## 2021-08-08 NOTE — Therapy (Signed)
OUTPATIENT SPEECH LANGUAGE PATHOLOGY TREATMENT NOTE  Patient Name: Cheyenne Martinez MRN: [id number] DOB:1975/12/05, 46 y.o., female Today's Date: 08/08/2021  PCP: No PCP REFERRING PROVIDER: Theora Master, MD   End of Session - 08/08/21 1447     Visit Number 6    Number of Visits 25    Date for SLP Re-Evaluation 10/13/21    Authorization Type [place] Medicaid Prepaid [employer]    Authorization Time Period 08/01/2021 thru 11/29/2021    Authorization - Visit Number 3    Authorization - Number of Visits 16    Progress Note Due on Visit 10    SLP Start Time 1230    SLP Stop Time  1310    SLP Time Calculation (min) 40 min    Activity Tolerance Patient tolerated treatment well             No past medical history on file. No past surgical history on file. 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 Evolving early subacute left PCA distribution infarct as above,  with additional scattered small volume ischemic changes within the  left frontotemporal and right parietal lobe as above. Associated  mild petechial blood products at the left occipital lobe without  frank hemorrhagic transformation or significant mass effect.  2. Underlying mild chronic microvascular ischemic disease with  chronic  right cerebellar infarct.    07/04/2021 CTA HEAD NECK W WO CONTRAST  IMPRESSION:  Evolving recent infarcts as seen on recent MRI. There is likely mild  petechial hemorrhage associated with the left parieto-occipital  infarct. No significant mass effect.  Plaque at the left greater than right ICA origins without  hemodynamically significant stenosis. Marked stenosis at the right  vertebral origin without apparent flow limitation.  Intracranial atherosclerosis involving anterior and posterior  circulations. No proximal intracranial vessel occlusion.    TODAY'S TREATMENT:  Skilled treatment session focused on pt's expressive and receptive language abilities. SLP facilitated session by providing the following interventions:  Utilized Constant Therapy Clinician App to target reading and receptive language Pick letters to match sounds: Level 1: 61% improving to 75% with max- moderate assistance Spell what you see (with help): Level 1: 69% improving to 75% with max-moderate assistance   PATIENT EDUCATION: Education details: requesting trial device from [place] Person educated: Patient, Child(ren), and Caregiver Education method: Explanation, Demonstration, and Verbal cues Education comprehension: needs further education   GOALS: Goals reviewed with patient? YES  SHORT TERM GOALS: Target date: 10 sessions  Given maximal multimodal cues, pt will increase speech approximation by repeating rote conversational words with 50% intelligibility. Baseline: unintelligible despite maximal cues Goal status: INITIAL  2.  Pt will use a SGD to answer simple biographical questions at 50% accuracy given frequent maximal verbal and maximal visual cues. Baseline: no accuracy Goal status: INITIAL  3.  Pt will  repeat single words using a speech generating device in 50% of opportunities given frequent maximal verbal and maximal visual cues. Baseline: new goal Goal status: INITIAL   LONG  TERM GOALS: Target date: 10/15/2021    Using multimodal communication, pt will express basic wants and needs in 8 out of 10 opportunities.  Baseline: unable Goal status: INITIAL  2.  Using multimodal communication, pt will answer basic questions in 8 out of 10 opportunities.  Baseline: unable Goal status: INITIAL  ASSESSMENT:  CLINICAL IMPRESSION: Pt presents with fatigue as ST is the 3rd of 3 therapy sessions today. Pt's performance was likely impacted by this. Plan made to allow pt 30 minute between PT/OT and ST. Pt plans to get a snack, something to drink during this time.   OBJECTIVE IMPAIRMENTS include expressive language, receptive language, aphasia, and dysarthria. These impairments are limiting patient from return to work, managing medications, managing appointments, managing finances, household responsibilities, ADLs/IADLs, and effectively communicating at home and in community. Factors affecting potential to achieve goals and functional outcome are severity of impairments. Patient will benefit from skilled SLP services to address above impairments and improve overall function.  REHAB POTENTIAL: Excellent  PLAN: SLP FREQUENCY: 2x/week  SLP DURATION: 12 weeks  PLANNED INTERVENTIONS: Language facilitation, Cueing hierachy, Internal/external aids, Functional tasks, Multimodal communication approach, and SLP instruction and feedback   Tierra Thoma B. Dreama Saa, M.S., CCC-SLP, Tree surgeon Certified Brain Injury Specialist [facility]  [facility] Rehabilitation Services Office [phone] Ascom [phone] Fax [phone]

## 2021-08-08 NOTE — Therapy (Signed)
OUTPATIENT OCCUPATIONAL THERAPY TREATMENT NOTE   Patient Name: Cheyenne Martinez MRN: [id number] DOB:Feb 16, 1975, 46 y.o., female Today's Date: 08/08/2021  REFERRING PROVIDER: Theora Master, MD   OT End of Session - 08/08/21 1547     Visit Number 5    Number of Visits 24    Date for OT Re-Evaluation 10/09/21    Authorization Time Period Reporting period beginning 07/18/21    OT Start Time 1150    OT Stop Time 1230    OT Time Calculation (min) 40 min    Activity Tolerance Patient tolerated treatment well    Behavior During Therapy Restless;Impulsive             History reviewed. No pertinent past medical history. History reviewed. No pertinent surgical history. Patient Active Problem List   Diagnosis Date Noted   CVA (cerebral vascular accident) (HCC) 07/01/2021   New onset type 2 diabetes mellitus (HCC) 07/01/2021   Dyslipidemia 07/01/2021   Hypertensive urgency 07/01/2021      REFERRING DIAG: CVA  THERAPY DIAG:  Muscle weakness (generalized)  Other lack of coordination  Rationale for Evaluation and Treatment Rehabilitation  PERTINENT HISTORY: 46 y.o. African-American female with no chronic medical problems, who presented to the ER with acute onset of altered mental status and difficulty finding words with occasional slurred speech over the last couple of days.  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 Associated mild petechial blood products at the left occipital lobe without frank hemorrhagic transformation or significant mass effect. 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 Patient also has new diagnosis of hypertension and diabetes with A1c of 11.2.  Lipid profile with elevated cholesterol at 238, triglyceride 159, HDL 39 and LDL 169 with goal less than  70.  PRECAUTIONS: None  SUBJECTIVE:  Pt. was present for therapy with her brother, [name].  PAIN:  Are you having pain? No  OBJECTIVE:   TODAY'S TREATMENT:   Pt. tolerated RUE AAROM for right scapular elevation, shoulder flexion, abduction, horizontal abduction, and adduction. Pt. Performed lateral pinch using a yellow resistive clip, upgraded to a red resistive clips. Pt. worked on reaching out to place the resistive clips onto a horizontal dowel.  Pt. worked on grasping 1 &1/2" pegs and placing them into, and out of a large Instructo pegboard. Pt. Worked on grasping 1" circular pegs, and removing them from a pegboard placed flat at the tabletop surface.   Pt. arrived for therapy with her brother [name] today. Education was provided RUE ROM, as well as about opportunities to engage the right hand during daily tasks at home. Pt. reports no pain today during the session. Pt. Improved the accuracy of grasping pegs as each task progressed. Pt. Was able to grasped various sized cylindrical, and circular objects using a 3pt. Grasp pattern. Pt. continues to require cues for visual demonstration for each task. Pt tolerated ROM well, and tolerated all tasks attempted with the RUE. Pt. Continues to require cues for rest breaks. Pt. Continues to present with impaired motor control, limited digit extension, and thumb abduction. Pt. Continues to work on improving UE strength, and [facility] skills in order to work towards improving RUE functioning, and maximizing engagement of the RUE, and overall independence with ADLs, and IADL tasks.   PATIENT EDUCATION: Education details: Pt.caregiver education about  RUE ROM, and opportunities for engagement of  the RUE, and hand during ADL/IADL tasks. Person educated: Patient and Caregiver Education method: Explanation, Demonstration, and Verbal cues Education comprehension: verbalized understanding, returned demonstration, verbal cues required, and needs further  education   HOME EXERCISE PROGRAM Pt. currently has a HEP for right hand strengthening with light blue theraputty. Pt./brother education about daily UE ROM.   OT Short Term Goals - 07/18/21 1444       OT SHORT TERM GOAL #1   Title Pt will perform RUE HEP for increasing strength and coordination with min vc.    Baseline Eval: initiated at eval; requires mod A with gripping and rolling putty    Time 6    Period Weeks    Status New    Target Date 08/28/21      OT SHORT TERM GOAL #2   Title Family will provide min vc to increase attention to R side for pt to more easily navigate her environment.    Baseline Eval: initiated education at eval as family was unaware of any visual decline (pt presents with mild R sided inattention and possible R field cut)    Time 6    Period Weeks    Status New    Target Date 08/28/21              OT Long Term Goals - 07/18/21 1447       OT LONG TERM GOAL #1   Title Pt will increase FOTO score to 45 or better to indicate perceived increased performance with daily tasks.    Baseline Eval: FOTO 36    Time 12    Period Weeks    Status New    Target Date 10/09/21      OT LONG TERM GOAL #2   Title Pt will increase RUE MMT by 1 muscle grade for improved engagement of RUE into self care tasks.    Baseline Eval: Pt using L non-dominant arm for all daily tasks; R shoulder grossly 3-, elbow 4-, wrist 3+    Time 12    Period Weeks    Status New    Target Date 10/09/21      OT LONG TERM GOAL #3   Title Pt will increase R grip strength by 7# to enable pt to hold and carry light ADL supplies in R dominant hand.    Baseline Eval: R 0, L 72    Time 12    Period Weeks    Status New    Target Date 10/09/21      OT LONG TERM GOAL #4   Title Pt will write name with R dominant hand with 50% legibility    Baseline Eval: pt can hold pen in R hand with OT assisting with set up, but pt can not make a mark on the paper    Time 12    Period Weeks     Status New    Target Date 10/09/21      OT LONG TERM GOAL #5   Title Pt will improve R hand dexterity for picking up pills with dominant hand.    Baseline Eval: R 9 hole unable (uses L non-dominant hand to pick up pills)    Time 12    Period Weeks    Status New    Target Date 10/09/21              Plan - 07/28/21 1149     Clinical Impression Statement Pt. arrived for therapy with her brother [name]  today. Education was provided RUE ROM, as well as about opportunities to engage the right hand during daily tasks at home. Pt. reports no pain today during the session. Pt. Improved the accuracy of grasping pegs as each task progressed. Pt. Was able to grasped various sized cylindrical, and circular objects using a 3pt. Grasp pattern. Pt. continues to require cues for visual demonstration for each task. Pt tolerated ROM well, and tolerated all tasks attempted with the RUE. Pt. Continues to require cues for rest breaks. Pt. Continues to present with impaired motor control, limited digit extension, and thumb abduction. Pt. Continues to work on improving UE strength, and [facility] skills in order to work towards improving RUE functioning, and maximizing engagement of the RUE, and overall independence with ADLs, and IADL tasks.      OT Occupational Profile and History Problem Focused Assessment - Including review of records relating to presenting problem    Occupational performance deficits (Please refer to evaluation for details): ADL's;IADL's;Work;Leisure    Body Structure / Function / Physical Skills ADL;GMC;ROM;UE functional use;Balance;Endurance;FMC;Pain;Strength;Coordination;Dexterity;Gait;IADL;Sensation;Tone;Vision    Cognitive Skills Attention;Safety Awareness;Understand;Emotional;Temperament/Personality    Rehab Potential Good    Clinical Decision Making Several treatment options, min-mod task modification necessary    Comorbidities Affecting Occupational Performance: None    Modification or  Assistance to Complete Evaluation  Min-Moderate modification of tasks or assist with assess necessary to complete eval    OT Frequency 2x / week    OT Duration 12 weeks    OT Treatment/Interventions Self-care/ADL training;Therapeutic exercise;Building services engineer;Therapeutic activities;Visual/perceptual remediation/compensation;Balance training;Coping strategies training;Cryotherapy;Moist Heat;Neuromuscular education;DME and/or AE instruction;Manual Therapy;Passive range of motion;Patient/family education    OT Home Exercise Plan Initiated theraputty exercises for gripping and rolling putty with RUE    Consulted and Agree with Plan of Care Patient;Family member/caregiver    Family Member Consulted Mother, [name] and daughter, Newton Pigg, [place], OTR/L   Olegario Messier, [place] 08/08/2021, 3:56 PM

## 2021-08-12 ENCOUNTER — Ambulatory Visit: Payer: Medicaid Other | Admitting: Speech Pathology

## 2021-08-12 DIAGNOSIS — I63532 Cerebral infarction due to unspecified occlusion or stenosis of left posterior cerebral artery: Secondary | ICD-10-CM

## 2021-08-12 DIAGNOSIS — R482 Apraxia: Secondary | ICD-10-CM

## 2021-08-12 DIAGNOSIS — R4701 Aphasia: Secondary | ICD-10-CM | POA: Diagnosis not present

## 2021-08-12 NOTE — Therapy (Signed)
OUTPATIENT SPEECH LANGUAGE PATHOLOGY TREATMENT NOTE  Patient Name: Cheyenne Martinez MRN: [id number] DOB:Shyleigh 14, 1977, 46 y.o., female Today's Date: 08/12/2021  PCP: No PCP REFERRING PROVIDER: Theora Master, MD   End of Session - 08/12/21 1534     Visit Number 7    Number of Visits 25    Date for SLP Re-Evaluation 10/13/21    Authorization Type [place] Medicaid Prepaid [employer]    Authorization Time Period 08/01/2021 thru 11/29/2021    Authorization - Visit Number 4    Authorization - Number of Visits 16    Progress Note Due on Visit 10    SLP Start Time 1100    SLP Stop Time  1145    SLP Time Calculation (min) 45 min    Activity Tolerance Patient tolerated treatment well             No past medical history on file. No past surgical history on file. 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 Evolving early subacute left PCA distribution infarct as above,  with additional scattered small volume ischemic changes within the  left frontotemporal and right parietal lobe as above. Associated  mild petechial blood products at the left occipital lobe without  frank hemorrhagic transformation or significant mass effect.  2. Underlying mild chronic microvascular ischemic disease with  chronic right cerebellar  infarct.    07/04/2021 CTA HEAD NECK W WO CONTRAST  IMPRESSION:  Evolving recent infarcts as seen on recent MRI. There is likely mild  petechial hemorrhage associated with the left parieto-occipital  infarct. No significant mass effect.  Plaque at the left greater than right ICA origins without  hemodynamically significant stenosis. Marked stenosis at the right  vertebral origin without apparent flow limitation.  Intracranial atherosclerosis involving anterior and posterior  circulations. No proximal intracranial vessel occlusion.    TODAY'S TREATMENT:  Skilled treatment session focused on pt's expressive and receptive language abilities. SLP facilitated session by providing the following interventions:  Introduced and instructed pt and her mother in pt's trail TouchTalk device. In addition, SLP utilized device to take pictures of pt, her children as well as a her medicines. SLP entered information about pt's daughter to illustrate possible communication phrases.   PATIENT EDUCATION: Education details: requesting trial device from [place] Person educated: Patient, Child(ren), and Caregiver Education method: Explanation, Demonstration, and Verbal cues Education comprehension: needs further education   GOALS: Goals reviewed with patient? YES  SHORT TERM GOALS: Target date: 10 sessions  Given maximal multimodal cues, pt will increase speech approximation by repeating rote conversational words with 50% intelligibility. Baseline: unintelligible despite maximal cues Goal status: INITIAL  2.  Pt will use a SGD to answer simple biographical questions at 50% accuracy given frequent maximal verbal and maximal visual cues. Baseline: no accuracy Goal status: INITIAL  3.  Pt will repeat single words using  a speech generating device in 50% of opportunities given frequent maximal verbal and maximal visual cues. Baseline: new goal Goal status: INITIAL   LONG TERM GOALS: Target  date: 10/15/2021    Using multimodal communication, pt will express basic wants and needs in 8 out of 10 opportunities.  Baseline: unable Goal status: INITIAL  2.  Using multimodal communication, pt will answer basic questions in 8 out of 10 opportunities.  Baseline: unable Goal status: INITIAL  ASSESSMENT:  CLINICAL IMPRESSION: Pt presents with continued fatigue even though ST was pt's only therapy. Pt continues with < 25% speech intelligibility.   OBJECTIVE IMPAIRMENTS include expressive language, receptive language, aphasia, and dysarthria. These impairments are limiting patient from return to work, managing medications, managing appointments, managing finances, household responsibilities, ADLs/IADLs, and effectively communicating at home and in community. Factors affecting potential to achieve goals and functional outcome are severity of impairments. Patient will benefit from skilled SLP services to address above impairments and improve overall function.  REHAB POTENTIAL: Excellent  PLAN: SLP FREQUENCY: 2x/week  SLP DURATION: 12 weeks  PLANNED INTERVENTIONS: Language facilitation, Cueing hierachy, Internal/external aids, Functional tasks, Multimodal communication approach, and SLP instruction and feedback   Hoa Briggs B. Dreama Saa, M.S., CCC-SLP, Tree surgeon Certified Brain Injury Specialist [facility]  [facility] Rehabilitation Services Office [phone] Ascom [phone] Fax [phone]

## 2021-08-15 ENCOUNTER — Ambulatory Visit: Payer: Medicaid Other

## 2021-08-15 ENCOUNTER — Ambulatory Visit: Payer: Medicaid Other | Admitting: Speech Pathology

## 2021-08-15 DIAGNOSIS — M6281 Muscle weakness (generalized): Secondary | ICD-10-CM

## 2021-08-15 DIAGNOSIS — I63532 Cerebral infarction due to unspecified occlusion or stenosis of left posterior cerebral artery: Secondary | ICD-10-CM

## 2021-08-15 DIAGNOSIS — R482 Apraxia: Secondary | ICD-10-CM

## 2021-08-15 DIAGNOSIS — R4701 Aphasia: Secondary | ICD-10-CM

## 2021-08-15 DIAGNOSIS — R278 Other lack of coordination: Secondary | ICD-10-CM

## 2021-08-16 NOTE — Therapy (Addendum)
OUTPATIENT SPEECH LANGUAGE PATHOLOGY TREATMENT NOTE  Patient Name: Cheyenne Martinez MRN: [id number] DOB:09-May-1975, 46 y.o., female Today's Date: 08/16/2021  PCP: No PCP REFERRING PROVIDER: Theora Master, MD   End of Session - 08/16/21 1424     Visit Number 8    Number of Visits 25    Date for SLP Re-Evaluation 10/13/21    Authorization Type [place] Medicaid Prepaid [employer]    Authorization Time Period 08/01/2021 thru 11/29/2021    Authorization - Visit Number 5    Authorization - Number of Visits 16    Progress Note Due on Visit 10    SLP Start Time 1315    SLP Stop Time  1400    SLP Time Calculation (min) 45 min    Activity Tolerance Treatment limited secondary to agitation             No past medical history on file. No past surgical history on file. 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 Evolving early subacute left PCA distribution infarct as above,  with additional scattered small volume ischemic changes within the  left frontotemporal and right parietal lobe as above. Associated  mild petechial blood products at the left occipital lobe without  frank hemorrhagic transformation or significant mass effect.  2. Underlying mild chronic microvascular ischemic disease with   chronic right cerebellar infarct.    07/04/2021 CTA HEAD NECK W WO CONTRAST  IMPRESSION:  Evolving recent infarcts as seen on recent MRI. There is likely mild  petechial hemorrhage associated with the left parieto-occipital  infarct. No significant mass effect.  Plaque at the left greater than right ICA origins without  hemodynamically significant stenosis. Marked stenosis at the right  vertebral origin without apparent flow limitation.  Intracranial atherosclerosis involving anterior and posterior  circulations. No proximal intracranial vessel occlusion.    TODAY'S TREATMENT:  Skilled treatment session focused on pt's expressive and receptive language abilities. SLP facilitated session by providing the following interventions:  SLP facilitated session by programming trial TouchTalk with phrases to use with her children such as: go clean your room, wash the dishes, take out the trash, put up your clothes. Despite maximal multimodal assistance, pt was not able to demonstrate knowledge of icon's message with communicative function.   Pt is home with her sons (ages 27 and 93) but attends session with her mother and/or her brother. Despite repeated education, family members have not demonstrated understanding of rationale for using AAC. Pt is unaware that her speech is unintelligible and demonstrates very poor task tolerance to awareness activities. Attempted Motivational Interviewing as well as Interest Checklist, however pt and family were not able to answer questions.  I asked her sons if they are able to understand their mom and before they could answer, pt stated, "they know, they know!" The youngest son offered  that she frequently calls them "mama." Pt used nonverbals to terminate responses to the question. In light of the pt's limited number of ST visits (16), recommend treatment focus on remedial speech and language tasks for the most functional improvement.   In targeting more remedial  tasks, SLP further facilitated session by having pt imitate functional phrases: How are you? I am fine, Elon Jester. Pt required maximal verbal cues as well as audio feedback. Pt appeared very surprised that her utterances were unintelligible. This activity was limited by pt's poor frustration level.   PATIENT EDUCATION: Education details: requesting trial device from [place] Person educated: Patient, Child(ren), and Caregiver Education method: Explanation, Demonstration, and Verbal cues Education comprehension: needs further education   GOALS: Goals reviewed with patient? YES  SHORT TERM GOALS: Target date: 10 sessions  Given maximal multimodal cues, pt will increase speech approximation by repeating rote conversational words with 50% intelligibility. Baseline: unintelligible despite maximal cues Goal status: INITIAL  2.  Pt will use a SGD to answer simple biographical questions at 50% accuracy given frequent maximal verbal and maximal visual cues. Baseline: no accuracy Goal status: INITIAL  3.  Pt will repeat single words using a speech generating device in 50% of opportunities given frequent maximal verbal and maximal visual cues. Baseline: new goal Goal status: INITIAL   LONG TERM GOALS: Target date: 10/15/2021    Using multimodal communication, pt will express basic wants and needs in 8 out of 10 opportunities.  Baseline: unable Goal status: INITIAL  2.  Using multimodal communication, pt will answer basic questions in 8 out of 10 opportunities.  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 Pt demonstrates poor tolerance of awareness activities. As such, she continues to be dependent on her communication partners for expressing  basic wants/needs.   OBJECTIVE IMPAIRMENTS include expressive language, receptive language, aphasia, and dysarthria. These impairments are limiting patient from return to work, managing medications, managing appointments, managing finances, household responsibilities, ADLs/IADLs, and effectively communicating at home and in community. Factors affecting potential to achieve goals and functional outcome are severity of impairments. Patient will benefit from skilled SLP services to address above impairments and improve overall function.  REHAB POTENTIAL: Excellent  PLAN: SLP FREQUENCY: 2x/week  SLP DURATION: 12 weeks  PLANNED INTERVENTIONS: Language facilitation, Cueing hierachy, Internal/external aids, Functional tasks, Multimodal communication approach, and SLP instruction and feedback   Abhiram Criado B. Dreama Saa, M.S., CCC-SLP, Tree surgeon Certified Brain Injury Specialist [facility]  [facility] Rehabilitation Services Office [phone] Ascom [phone] Fax [phone]

## 2021-08-16 NOTE — Therapy (Signed)
OUTPATIENT OCCUPATIONAL THERAPY TREATMENT NOTE   Patient Name: Cheyenne Martinez MRN: [id number] DOB:02/19/1975, 46 y.o., female Today's Date: 08/16/2021  REFERRING PROVIDER: Theora Master, MD   OT End of Session - 08/16/21 0806     Visit Number 6    Number of Visits 24    Date for OT Re-Evaluation 10/09/21    Authorization Time Period Reporting period beginning 07/18/21    OT Start Time 1358    OT Stop Time 1442    OT Time Calculation (min) 44 min    Activity Tolerance Patient tolerated treatment well    Behavior During Therapy Restless             No past medical history on file. No past surgical history on file. 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 African-American female with no chronic medical problems, who presented to the ER with acute onset of altered mental status and difficulty finding words with occasional slurred speech over the last couple of days.  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 Associated mild petechial blood products at the left occipital lobe without frank hemorrhagic transformation or significant mass effect. 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 Patient also has new diagnosis of hypertension and diabetes with A1c of 11.2.  Lipid profile with elevated cholesterol at 238, triglyceride 159, HDL 39 and LDL 169  with goal less than 70.  PRECAUTIONS: None  SUBJECTIVE:  Pt. was present for therapy with her brother, [name].  PAIN:  Are you having pain? No  OBJECTIVE:   TODAY'S TREATMENT:   Neuro re-ed: Participation in grasp/release patterns with RUE, picking up jumbo pegs from container and placing into pegboard.  Pt was able to pick up a few from container with extra time, but struggled to move pegs from horizontal to vertical position in hand in prep for placement into pegboard, so activity was downgraded with OT providing vertical set up of peg for pt to grasp.  Pt performed a second trial in standing to remove ~10 pegs from board d/t restlessness in sitting.  Min guard to maintain standing balance with L hand on table for additional support.  Pt used a combination of quadruped and tripod grasp to move pegs.  Pt was able to remove ~10 pegs with hand gripper at 6.6# with mod A from OT, so transitioned to removal of pegs without gripper, and facilitated forward, ipsilateral and contralateral reaching with RUE to place pegs back in container with RUE.  Pt was cued to minimize trunk movement while reaching to target shoulder and elbow movements to reach for pegs and was able to correct with mod verbal and visual cueing.   Therapeutic Exercise: Participation in RUE strengthening with use of 1# dumbbell to perform R wrist flex/ext and forearm pron/sup.  OT provided constant min guard to maintain correct positioning and to maintain grasp of dumbbell in hand.  Performed grip strengthening with gross hand gripper with 1  red band for resistance x3 sets 10 reps.  OT assisted with set up of hand gripper in hand.  Pt performed 1.5# dowel exercises for bilat shoulder flex and chest press x3 sets 10 reps each.  Intermittent min guard-min A to maximize range and quality of movement on the RUE.    PATIENT EDUCATION: Education details: Pt.caregiver education about  RUE ROM, and opportunities for engagement of the RUE, and  hand during ADL/IADL tasks. Person educated: Patient and Caregiver Education method: Explanation, Demonstration, and Verbal cues Education comprehension: verbalized understanding, returned demonstration, verbal cues required, and needs further education   HOME EXERCISE PROGRAM Pt. currently has a HEP for right hand strengthening with light blue theraputty. Pt./brother education about daily UE ROM.   OT Short Term Goals - 07/18/21 1444       OT SHORT TERM GOAL #1   Title Pt will perform RUE HEP for increasing strength and coordination with min vc.    Baseline Eval: initiated at eval; requires mod A with gripping and rolling putty    Time 6    Period Weeks    Status New    Target Date 08/28/21      OT SHORT TERM GOAL #2   Title Family will provide min vc to increase attention to R side for pt to more easily navigate her environment.    Baseline Eval: initiated education at eval as family was unaware of any visual decline (pt presents with mild R sided inattention and possible R field cut)    Time 6    Period Weeks    Status New    Target Date 08/28/21              OT Long Term Goals - 07/18/21 1447       OT LONG TERM GOAL #1   Title Pt will increase FOTO score to 45 or better to indicate perceived increased performance with daily tasks.    Baseline Eval: FOTO 36    Time 12    Period Weeks    Status New    Target Date 10/09/21      OT LONG TERM GOAL #2   Title Pt will increase RUE MMT by 1 muscle grade for improved engagement of RUE into self care tasks.    Baseline Eval: Pt using L non-dominant arm for all daily tasks; R shoulder grossly 3-, elbow 4-, wrist 3+    Time 12    Period Weeks    Status New    Target Date 10/09/21      OT LONG TERM GOAL #3   Title Pt will increase R grip strength by 7# to enable pt to hold and carry light ADL supplies in R dominant hand.    Baseline Eval: R 0, L 72    Time 12    Period Weeks    Status New    Target Date 10/09/21       OT LONG TERM GOAL #4   Title Pt will write name with R dominant hand with 50% legibility    Baseline Eval: pt can hold pen in R hand with OT assisting with set up, but pt can not make a mark on the paper    Time 12    Period Weeks    Status New    Target Date 10/09/21      OT LONG TERM GOAL #5   Title Pt will improve R hand dexterity for picking up pills with dominant hand.  Baseline Eval: R 9 hole unable (uses L non-dominant hand to pick up pills)    Time 12    Period Weeks    Status New    Target Date 10/09/21              Plan - 07/28/21 1149     Clinical Impression Statement Pt quite restless today throughout OT session.  OT provided extensive positive reinforcement, frequent rest breaks, frequent changes in activity, and sitting and standing activity to reduce restlessness.  Overall, no pain reported in RUE.  Pt responded well to cues for reducing compensatory trunk leaning when reaching with RUE.  Pt required min guard-min A for RUE strengthening with dowel and dumbbell.  Pt continues to work on improving UE strength, and [facility] skills in order to work towards improving RUE functioning, and maximizing engagement of the RUE, and overall independence with ADLs, and IADL tasks.      OT Occupational Profile and History Problem Focused Assessment - Including review of records relating to presenting problem    Occupational performance deficits (Please refer to evaluation for details): ADL's;IADL's;Work;Leisure    Body Structure / Function / Physical Skills ADL;GMC;ROM;UE functional use;Balance;Endurance;FMC;Pain;Strength;Coordination;Dexterity;Gait;IADL;Sensation;Tone;Vision    Cognitive Skills Attention;Safety Awareness;Understand;Emotional;Temperament/Personality    Rehab Potential Good    Clinical Decision Making Several treatment options, min-mod task modification necessary    Comorbidities Affecting Occupational Performance: None    Modification or Assistance to Complete  Evaluation  Min-Moderate modification of tasks or assist with assess necessary to complete eval    OT Frequency 2x / week    OT Duration 12 weeks    OT Treatment/Interventions Self-care/ADL training;Therapeutic exercise;Building services engineer;Therapeutic activities;Visual/perceptual remediation/compensation;Balance training;Coping strategies training;Cryotherapy;Moist Heat;Neuromuscular education;DME and/or AE instruction;Manual Therapy;Passive range of motion;Patient/family education    OT Home Exercise Plan Initiated theraputty exercises for gripping and rolling putty with RUE    Consulted and Agree with Plan of Care Patient;Family member/caregiver    Family Member Consulted Mother, [name] and daughter, Karren Cobble, MS, OTR/L  [employer] Dials, [place] 08/16/2021, 8:08 AM

## 2021-08-17 ENCOUNTER — Encounter: Payer: Managed Care, Other (non HMO) | Admitting: Speech Pathology

## 2021-08-18 ENCOUNTER — Ambulatory Visit: Payer: Medicaid Other

## 2021-08-18 ENCOUNTER — Ambulatory Visit: Payer: Medicaid Other | Admitting: Speech Pathology

## 2021-08-18 DIAGNOSIS — R262 Difficulty in walking, not elsewhere classified: Secondary | ICD-10-CM

## 2021-08-18 DIAGNOSIS — I63532 Cerebral infarction due to unspecified occlusion or stenosis of left posterior cerebral artery: Secondary | ICD-10-CM

## 2021-08-18 DIAGNOSIS — R2681 Unsteadiness on feet: Secondary | ICD-10-CM

## 2021-08-18 DIAGNOSIS — R278 Other lack of coordination: Secondary | ICD-10-CM

## 2021-08-18 DIAGNOSIS — M6281 Muscle weakness (generalized): Secondary | ICD-10-CM

## 2021-08-18 DIAGNOSIS — R482 Apraxia: Secondary | ICD-10-CM

## 2021-08-18 DIAGNOSIS — R4701 Aphasia: Secondary | ICD-10-CM

## 2021-08-18 NOTE — Therapy (Signed)
OUTPATIENT SPEECH LANGUAGE PATHOLOGY TREATMENT NOTE  Patient Name: Cheyenne Martinez MRN: [id number] DOB:1975/07/05, 46 y.o., female Today's Date: 08/18/2021  PCP: No PCP REFERRING PROVIDER: Theora Master, MD   End of Session - 08/18/21 1344     Visit Number 9    Number of Visits 25    Date for SLP Re-Evaluation 10/13/21    Authorization Type [place] Medicaid [employer]    Authorization Time Period 08/01/2021 thru 11/29/2021    Authorization - Visit Number 6    Authorization - Number of Visits 16    Progress Note Due on Visit 10    SLP Start Time 1310    SLP Stop Time  1455    SLP Time Calculation (min) 105 min    Activity Tolerance Patient tolerated treatment well             No past medical history on file. No past surgical history on file. 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 Evolving early subacute left PCA distribution infarct as above,  with additional scattered small volume ischemic changes within the  left frontotemporal and right parietal lobe as above. Associated  mild petechial blood products at the left occipital lobe without  frank hemorrhagic transformation or significant mass effect.  2. Underlying mild chronic microvascular  ischemic disease with  chronic right cerebellar infarct.    07/04/2021 CTA HEAD NECK W WO CONTRAST  IMPRESSION:  Evolving recent infarcts as seen on recent MRI. There is likely mild  petechial hemorrhage associated with the left parieto-occipital  infarct. No significant mass effect.  Plaque at the left greater than right ICA origins without  hemodynamically significant stenosis. Marked stenosis at the right  vertebral origin without apparent flow limitation.  Intracranial atherosclerosis involving anterior and posterior  circulations. No proximal intracranial vessel occlusion.    TODAY'S TREATMENT:  Skilled treatment session focused on pt's expressive and receptive language abilities. SLP facilitated session by providing the following interventions:  Pt was accompanied by her mother and SLP attempted to gather more information about pt's communication abilities. Her mother reports that pt supplements her verbal communication by sending her pictures of her bills, showing her pictures on her phone and text messages. To promote consistent carryover and to improve pt's outcome, request pt's mother attend ST sessions. Offered to reschedule to potentially accommodate any conflicts. In this discussion, pt stated "no" and gestured "no" to potential use of the TouchTalk.   SLP further facilitated the session by using the TalkPath Therapy App; specifically functional phrase repetition. Pt benefited greatly from hearing herself as this appeared to increase her awareness of errors. Pt improved throughout the productions of each phrase. Functional phrases were written down with instruction provided to pt and her mother on how to practice at home.  PATIENT EDUCATION: Education details: consistent carryover of concepts within session Person educated: patient and her mother Education method: Explanation, Demonstration, and Verbal cues Education comprehension: needs further  education   GOALS: Goals reviewed with patient? YES  SHORT TERM GOALS: Target date: 10 sessions  Given maximal multimodal cues, pt will increase speech approximation by repeating rote conversational words with 50% intelligibility. Baseline: unintelligible despite maximal cues Goal status: INITIAL  2.  Pt will use a SGD to answer simple biographical questions at 50% accuracy given frequent maximal verbal and maximal visual cues. Baseline: no accuracy Goal status: INITIAL  3.  Pt will repeat single words using a speech generating device in 50% of opportunities given frequent maximal verbal and maximal visual cues. Baseline: new goal Goal status: INITIAL   LONG TERM GOALS: Target date: 10/15/2021    Using multimodal communication, pt will express basic wants and needs in 8 out of 10 opportunities.  Baseline: unable Goal status: INITIAL  2.  Using multimodal communication, pt will answer basic questions in 8 out of 10 opportunities.  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   OBJECTIVE IMPAIRMENTS include expressive language, receptive language, aphasia, and dysarthria. These impairments are limiting patient from return to work, managing medications, managing appointments, managing finances, household responsibilities, ADLs/IADLs, and effectively communicating at home and in community. Factors affecting potential to achieve goals and functional outcome are severity of impairments. Patient will benefit from skilled SLP services to address above impairments and improve overall function.  REHAB POTENTIAL: Excellent  PLAN: SLP FREQUENCY: 2x/week  SLP DURATION: 12 weeks  PLANNED INTERVENTIONS: Language facilitation, Cueing hierachy,  Internal/external aids, Functional tasks, Multimodal communication approach, and SLP instruction and feedback   Jahnyla Parrillo B. Dreama Saa, M.S., CCC-SLP, Tree surgeon Certified Brain Injury Specialist [facility]  [facility] [place] [facility] Rehabilitation Services Office [phone] Ascom [phone] Fax [phone]

## 2021-08-18 NOTE — Therapy (Signed)
OUTPATIENT PHYSICAL THERAPY NEURO TREATMENT NOTE   Patient Name: Cheyenne Martinez MRN: [id number] DOB:May 17, 1975, 46 y.o., female Today's Date: 08/18/2021   PCP: Morene Crocker MD REFERRING PROVIDER: Morene Crocker MD   PT End of Session - 08/18/21 1650     Visit Number 4    Number of Visits 24    Date for PT Re-Evaluation 10/12/21    Authorization Type 1/10 eval 07/20/21    PT Start Time 1149    PT Stop Time 1230    PT Time Calculation (min) 41 min    Equipment Utilized During Treatment Gait belt    Activity Tolerance Patient tolerated treatment well    Behavior During Therapy Restless               History reviewed. No pertinent past medical history. History reviewed. No pertinent surgical history. Patient Active Problem List   Diagnosis Date Noted   CVA (cerebral vascular accident) (HCC) 07/01/2021   New onset type 2 diabetes mellitus (HCC) 07/01/2021   Dyslipidemia 07/01/2021   Hypertensive urgency 07/01/2021     ONSET DATE: 07/01/21  REFERRING DIAG: CVA  THERAPY DIAG:  Muscle weakness (generalized)  Other lack of coordination  Difficulty in walking, not elsewhere classified  Unsteadiness on feet  Rationale for Evaluation and Treatment Rehabilitation  SUBJECTIVE:                                                                                                                                                                                              SUBJECTIVE STATEMENT:  Pt reports no falls/stumbles since last visit. Mother reports pt gets brace next Thrusday. Pt denies any aches/pains or other concerns. Planning to discuss AD prescription with physician today.  Pt accompanied by: family member, mother [name]  PERTINENT HISTORY: Patient is a 46 year old female who presented to ER with AMS on 07/01/21; MRI showed early subacute left PCA distribution infarct with ischemic changes within the left frontotemporal and right parietal lobe. Associated mild  petechial blood products at the left occipital lobe without frank hemorrhagic transformation or significant mass effect. 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     Patient also has new diagnosis of hypertension and diabetes. Per patient's evaluation with OT. Patient's daughter is now living with patient s/p stroke. Patient is very aphasic.  Patient has had one fall. Patient was working at [employer] and independent prior to stroke. Patient is walking without anything.   PAIN:  Are you having pain? No  PRECAUTIONS: Fall  WEIGHT BEARING RESTRICTIONS: No  PLOF: Independent  PATIENT GOALS: to get stronger and use RLE   OBJECTIVE:  The following measure were taken from previous session notes unless otherwise noted.  DIAGNOSTIC FINDINGS: MRI showed early subacute left PCA distribution infarct with ischemic changes within the left frontotemporal and right parietal lobe. Associated mild petechial blood products at the left occipital lobe without frank hemorrhagic transformation or significant mass effect. 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  COGNITION: Overall cognitive status: Impaired   SENSATION: Light touch: Impaired  on LLE  COORDINATION: Impaired on LLE, limited coordination and spatial awareness.    TODAY'S TREATMENT:  08/18/2021:   Ambulation for endurance with QC: 148' total, reports "little bit tired"   Alternating LAQ: 16x & 20x, 2# AW, 2-3 sec hold   Seated march: 2 x 20, alternating LE, 2# AW donned  STS: 2 x 12, cues for foot placement   Seated crunches: reclined in chair, 2 x 12, pt rates tiring  NuStep: 6 mins total, lvl 1 (seat 8, arms 12), goal to maintain > 50 spm, increased time taken for setup, HHA provided by SPT for maintaining RUE on handle, frequent cuing for increased SPM to maintain therapeutic intensity    Pt  tolerates all interventions well without pain or excessive fatigue. Does require VC/TC throughout for correct performance of interventions/activities.      PATIENT EDUCATION: Education details: Pt educated throughout session about proper posture and technique with exercises. Improved exercise technique, movement at target joints, use of target muscles after min to mod verbal, visual, tactile cues.  Person educated: Patient and Caregiver Education method: Explanation, Demonstration, Tactile cues, and Verbal cues Education comprehension: returned demonstration, verbal cues required, tactile cues required, and needs further education   HOME EXERCISE PROGRAM:  08/18/21: No updates  Access Code: [email or web address] URL: [email or web address] Date: 07/20/2021 Prepared by: Precious Bard  Exercises - Leg Extension  - 1 x daily - 7 x weekly - 2 sets - 10 reps - 5 hold - Seated March  - 1 x daily - 7 x weekly - 2 sets - 10 reps - 5 hold - Seated Hip Abduction  - 1 x daily - 7 x weekly - 2 sets - 10 reps - 5 hold - Seated Hip Adduction Isometrics with Ball  - 1 x daily - 7 x weekly - 2 sets - 10 reps - 5 hold    GOALS: Goals reviewed with patient? Yes  SHORT TERM GOALS: Target date: 08/17/2021  Patient will be independent in home exercise program to improve strength/mobility for better functional independence with ADLs. Baseline: 6/21: HEP given  Goal status: INITIAL  2.  Patient will deny any falls over past 4 weeks to demonstrate improved safety awareness at home and work.  Baseline: 6/21: 1 fall  Goal status: INITIAL  LONG TERM GOALS: Target date: 10/12/2021  Patient will increase FOTO score to equal to or greater than  68   to demonstrate statistically significant improvement in mobility and quality of life.  Baseline: 6/21: 49 Goal status: INITIAL  2.   Patient (< 68 years old) will complete five times sit to stand test in < 10 seconds indicating an increased LE strength and  improved balance Baseline: 6/21: 20 seconds with heavy LUE support Goal status: INITIAL  3.  Patient will increase 10 meter walk test to >1.45m/s as to improve gait speed for better community ambulation and to reduce fall risk. Baseline: 6/21 : 26 seconds with 3 LOB  Goal status: INITIAL  4.  Patient will increase BLE gross strength to 4+/5 as to improve functional strength for independent gait, increased standing tolerance and increased ADL ability. Baseline: 6/21: see note Goal status: INITIAL   ASSESSMENT:  CLINICAL IMPRESSION: Pt is motivated for today's session focusing on progressing BLE strengthening. Pt demonstrated improved strength AEB increase resistance and repetitions. Required step-up assist for NuStep, however, pt was able to reach > 50 spm by the end of the session. The pt will continue to benefit from skilled PT to increase strength, improve gait, and improve QoL and independence with ADLs.  OBJECTIVE IMPAIRMENTS Abnormal gait, decreased activity tolerance, decreased balance, decreased cognition, decreased coordination, decreased endurance, decreased knowledge of condition, decreased knowledge of use of DME, decreased mobility, difficulty walking, decreased strength, decreased safety awareness, and increased fascial restrictions.   ACTIVITY LIMITATIONS carrying, lifting, bending, standing, squatting, stairs, transfers, hygiene/grooming, locomotion level, and caring for others  PARTICIPATION LIMITATIONS: cleaning, laundry, interpersonal relationship, driving, shopping, community activity, occupation, yard work, and church  PERSONAL FACTORS Age, Behavior pattern, Education, Past/current experiences, Social background, Time since onset of injury/illness/exacerbation, Transportation, and 3+ comorbidities: CVA, HTN, dyslipidemia, DM type II  are also affecting patient's functional outcome.   REHAB POTENTIAL: Fair    CLINICAL DECISION MAKING: Evolving/moderate  complexity  EVALUATION COMPLEXITY: Moderate  PLAN: PT FREQUENCY: 2x/week  PT DURATION: 12 weeks  PLANNED INTERVENTIONS: Therapeutic exercises, Therapeutic activity, Neuromuscular re-education, Balance training, Gait training, Patient/Family education, Joint mobilization, Stair training, Vestibular training, Canalith repositioning, Orthotic/Fit training, DME instructions, Cognitive remediation, Electrical stimulation, Cryotherapy, Moist heat, Splintting, Taping, Vasopneumatic device, Traction, Ultrasound, Biofeedback, and Manual therapy  PLAN FOR NEXT SESSION: strengthening, endurance, and gait interventions    Avon Gully, SPT  This entire session was performed under direct supervision and direction of a licensed therapist. I have personally read, edited and approve of the note as written.  Temple Pacini PT, DPT     08/18/2021, 5:12 PM

## 2021-08-22 ENCOUNTER — Encounter: Payer: Self-pay | Admitting: Occupational Therapy

## 2021-08-22 ENCOUNTER — Ambulatory Visit: Payer: Medicaid Other | Admitting: Speech Pathology

## 2021-08-22 ENCOUNTER — Ambulatory Visit: Payer: Medicaid Other | Admitting: Occupational Therapy

## 2021-08-22 DIAGNOSIS — R278 Other lack of coordination: Secondary | ICD-10-CM

## 2021-08-22 DIAGNOSIS — R4701 Aphasia: Secondary | ICD-10-CM

## 2021-08-22 DIAGNOSIS — I63532 Cerebral infarction due to unspecified occlusion or stenosis of left posterior cerebral artery: Secondary | ICD-10-CM

## 2021-08-22 DIAGNOSIS — R482 Apraxia: Secondary | ICD-10-CM

## 2021-08-22 DIAGNOSIS — M6281 Muscle weakness (generalized): Secondary | ICD-10-CM

## 2021-08-22 NOTE — Therapy (Signed)
OUTPATIENT OCCUPATIONAL THERAPY TREATMENT NOTE   Patient Name: Cheyenne Martinez MRN: [id number] DOB:02-09-1975, 46 y.o., female Today's Date: 08/22/2021  REFERRING PROVIDER: Theora Master, MD   OT End of Session - 08/22/21 1410     Visit Number 7    Number of Visits 24    Date for OT Re-Evaluation 10/09/21    Authorization Time Period Reporting period beginning 07/18/21    OT Start Time 1145    OT Stop Time 1225    OT Time Calculation (min) 40 min    Activity Tolerance Patient tolerated treatment well    Behavior During Therapy Restless             History reviewed. No pertinent past medical history. History reviewed. No pertinent surgical history. Patient Active Problem List   Diagnosis Date Noted   CVA (cerebral vascular accident) (HCC) 07/01/2021   New onset type 2 diabetes mellitus (HCC) 07/01/2021   Dyslipidemia 07/01/2021   Hypertensive urgency 07/01/2021      REFERRING DIAG: CVA  THERAPY DIAG:  Muscle weakness (generalized)  Other lack of coordination  Rationale for Evaluation and Treatment Rehabilitation  PERTINENT HISTORY: 46 y.o. African-American female with no chronic medical problems, who presented to the ER with acute onset of altered mental status and difficulty finding words with occasional slurred speech over the last couple of days.  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 Associated mild petechial blood products at the left occipital lobe without frank hemorrhagic transformation or significant mass effect. 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 Patient also has new diagnosis of hypertension and diabetes with A1c of 11.2.  Lipid profile with elevated cholesterol at 238, triglyceride 159, HDL 39 and LDL 169 with goal less than  70.  PRECAUTIONS: None  SUBJECTIVE:  Pt. was present for therapy with her brother, [name].  PAIN:  Are you having pain? No  OBJECTIVE:   TODAY'S TREATMENT:   Pt. tolerated RUE AAROM for right scapular elevation, shoulder flexion, abduction, horizontal abduction, and adduction.  Pt. Performed bilateral UE strengthening with 1.5# dowel for 1 set 10 reps each. Pt. worked on [employer], and reciprocal motion using the UBE while seated for 4 min. & 42 sec. with no resistance. Constant monitoring was provided, and support at the right elbow. Pt. worked with the UBE elevated to encourage shoulder flexion.  Pt. Performed right lateral pinch using a yellow resistive clip, upgraded to a red resistive clips. Pt. worked on reaching out to place the resistive clips onto a horizontal dowel. Pt. Worked on grasping one inch resistive cubes alternating thumb opposition to the tip of the 2nd and 5th digits. The board was positioned at a vertical angle. Pt. Worked on pressing them back into place while isolating 2nd digit extension with the board flat at the tabletop surface.   Pt. arrived for therapy with her mother today. Education was provided RUE ROM, and reviewed opportunities to engage the right hand during daily tasks at home. Pt. reports no pain today during the session. Pt. was able to Pt. continues to require cues for visual demonstration for each task. Pt tolerated ROM, and strengthening exercises with visual, and verbal cues.  Pt. continues to require cues for multiple rest breaks.  Pt. requires visual cues, and cues for visual demonstration for each step of the fine motor cube task.  Pt. Is improving with motor planning through movements with fewer visual cues. Pt. continues to work on improving UE strength, and [facility] skills in order to work towards improving RUE functioning, and maximizing engagement of the RUE, and overall independence with ADLs, and IADL tasks.   PATIENT EDUCATION: Education  details: Pt.caregiver education about  RUE ROM, and opportunities for engagement of the RUE, and hand during ADL/IADL tasks. Person educated: Patient and Caregiver Education method: Explanation, Demonstration, and Verbal cues Education comprehension: verbalized understanding, returned demonstration, verbal cues required, and needs further education   HOME EXERCISE PROGRAM Pt. currently has a HEP for right hand strengthening with light blue theraputty. Pt./brother education about daily UE ROM.   OT Short Term Goals - 07/18/21 1444       OT SHORT TERM GOAL #1   Title Pt will perform RUE HEP for increasing strength and coordination with min vc.    Baseline Eval: initiated at eval; requires mod A with gripping and rolling putty    Time 6    Period Weeks    Status New    Target Date 08/28/21      OT SHORT TERM GOAL #2   Title Family will provide min vc to increase attention to R side for pt to more easily navigate her environment.    Baseline Eval: initiated education at eval as family was unaware of any visual decline (pt presents with mild R sided inattention and possible R field cut)    Time 6    Period Weeks    Status New    Target Date 08/28/21              OT Long Term Goals - 07/18/21 1447       OT LONG TERM GOAL #1   Title Pt will increase FOTO score to 45 or better to indicate perceived increased performance with daily tasks.    Baseline Eval: FOTO 36    Time 12    Period Weeks    Status New    Target Date 10/09/21      OT LONG TERM GOAL #2   Title Pt will increase RUE MMT by 1 muscle grade for improved engagement of RUE into self care tasks.    Baseline Eval: Pt using L non-dominant arm for all daily tasks; R shoulder grossly 3-, elbow 4-, wrist 3+    Time 12    Period Weeks    Status New    Target Date 10/09/21      OT LONG TERM GOAL #3   Title Pt will increase R grip strength by 7# to enable pt to hold and carry light ADL supplies in R dominant hand.     Baseline Eval: R 0, L 72    Time 12    Period Weeks    Status New    Target Date 10/09/21      OT LONG TERM GOAL #4   Title Pt will write name with R dominant hand with 50% legibility    Baseline Eval: pt can hold pen in R hand with OT assisting with set up, but pt can not make a mark on the paper    Time 12    Period Weeks    Status New    Target Date 10/09/21      OT LONG TERM GOAL #5   Title Pt will improve R hand dexterity for picking up pills with dominant hand.    Baseline Eval: R 9 hole  unable (uses L non-dominant hand to pick up pills)    Time 12    Period Weeks    Status New    Target Date 10/09/21              Plan - 07/28/21 1149     Clinical Impression Statement Pt. arrived for therapy with her mother today. Education was provided RUE ROM, and reviewed opportunities to engage the right hand during daily tasks at home. Pt. reports no pain today during the session. Pt. was able to Pt. continues to require cues for visual demonstration for each task. Pt tolerated ROM, and strengthening exercises with visual, and verbal cues.  Pt. continues to require cues for multiple rest breaks.  Pt. requires visual cues, and cues for visual demonstration for each step of the fine motor cube task.  Pt. Is improving with motor planning through movements with fewer visual cues. Pt. continues to work on improving UE strength, and [facility] skills in order to work towards improving RUE functioning, and maximizing engagement of the RUE, and overall independence with ADLs, and IADL tasks.     OT Occupational Profile and History Problem Focused Assessment - Including review of records relating to presenting problem    Occupational performance deficits (Please refer to evaluation for details): ADL's;IADL's;Work;Leisure    Body Structure / Function / Physical Skills ADL;GMC;ROM;UE functional use;Balance;Endurance;FMC;Pain;Strength;Coordination;Dexterity;Gait;IADL;Sensation;Tone;Vision    Cognitive  Skills Attention;Safety Awareness;Understand;Emotional;Temperament/Personality    Rehab Potential Good    Clinical Decision Making Several treatment options, min-mod task modification necessary    Comorbidities Affecting Occupational Performance: None    Modification or Assistance to Complete Evaluation  Min-Moderate modification of tasks or assist with assess necessary to complete eval    OT Frequency 2x / week    OT Duration 12 weeks    OT Treatment/Interventions Self-care/ADL training;Therapeutic exercise;Building services engineer;Therapeutic activities;Visual/perceptual remediation/compensation;Balance training;Coping strategies training;Cryotherapy;Moist Heat;Neuromuscular education;DME and/or AE instruction;Manual Therapy;Passive range of motion;Patient/family education    OT Home Exercise Plan Initiated theraputty exercises for gripping and rolling putty with RUE    Consulted and Agree with Plan of Care Patient;Family member/caregiver    Family Member Consulted Mother, [name] and daughter, Newton Pigg, MS, OTR/L   Olegario Messier, [place] 08/22/2021, 2:18 PM

## 2021-08-22 NOTE — Therapy (Signed)
OUTPATIENT SPEECH LANGUAGE PATHOLOGY TREATMENT NOTE 10TH VISIT PROGRESS NOTE  Patient Name: Cheyenne Martinez MRN: [id number] DOB:[birth year], 46 y.o., female Today's Date: 08/22/2021  PCP: No PCP REFERRING PROVIDER: Theora Master, MD   End of Session - 08/22/21 1238     Visit Number 10    Number of Visits 25    Date for SLP Re-Evaluation 10/13/21    Authorization Type  Medicaid Prepaid [employer]    Authorization Time Period 08/01/2021 thru 11/29/2021    Authorization - Visit Number 7    Authorization - Number of Visits 16    Progress Note Due on Visit 10    SLP Start Time 1230    SLP Stop Time  1315    SLP Time Calculation (min) 45 min    Activity Tolerance Patient tolerated treatment well             No past medical history on file. No past surgical history on file. 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 Evolving early subacute left PCA distribution infarct as above,  with additional scattered small volume ischemic changes within the  left frontotemporal and right parietal lobe as above. Associated  mild petechial blood products at the left occipital lobe without  frank hemorrhagic transformation or significant mass effect.  2.  Underlying mild chronic microvascular ischemic disease with  chronic right cerebellar infarct.    07/04/2021 CTA HEAD NECK W WO CONTRAST  IMPRESSION:  Evolving recent infarcts as seen on recent MRI. There is likely mild  petechial hemorrhage associated with the left parieto-occipital  infarct. No significant mass effect.  Plaque at the left greater than right ICA origins without  hemodynamically significant stenosis. Marked stenosis at the right  vertebral origin without apparent flow limitation.  Intracranial atherosclerosis involving anterior and posterior  circulations. No proximal intracranial vessel occlusion.    TODAY'S TREATMENT:  Skilled treatment session focused on pt's expressive and receptive language abilities. SLP facilitated session by providing the following interventions:  SLP facilitated the session by using the TalkPath Therapy App; specifically functional phrase repetition. Pt with 50% speech intelligibility. Familiar phrases are better, limiting structured repetition tasks limited.    PATIENT EDUCATION: Education details: consistent carryover of concepts within session Person educated: patient and her mother Education method: Explanation, Demonstration, and Verbal cues Education comprehension: needs further education   GOALS: Goals reviewed with patient? YES  SHORT TERM GOALS: Target date: 10 sessions  Given maximal multimodal cues, pt will increase speech approximation by repeating rote conversational words with 50% intelligibility. Baseline: unintelligible despite maximal cues Goal status: 25%; ongoing  2.  Pt will use a SGD to answer simple biographical questions at 50% accuracy given frequent maximal verbal and maximal visual cues. Baseline: no accuracy Goal status: discontinued, pt uninterested in using SGD  3.  Pt will repeat single words such as family members' names in 50% of opportunities given frequent maximal verbal and maximal visual  cues. Baseline: previous goal discontinued as pt didn't desire to use SGD Goal status: INITIAL (08/22/2021)   LONG TERM GOALS: Target date: 10/15/2021    Using multimodal communication, pt will express basic wants and needs in 8 out of 10 opportunities.  Baseline: unable Goal status: INITIAL  2.  Using multimodal communication, pt will answer basic questions in 8 out of 10 opportunities.  Baseline: unable Goal status: INITIAL  ASSESSMENT:  CLINICAL IMPRESSION: Pt has made slower than anticipated progress over the course of 10 therapy sessions. This is likely d/t a combination of issues - fatigue/sleepiness, severity of deficits, poor health literacy, poor task tolerance and decision not to use SGD. Pt is limited in her ability to perform HEP as her children are young and family exhibits poor health literacy. Continue to recommend skilled ST to target pt's severe impairments.   OBJECTIVE IMPAIRMENTS include expressive language, receptive language, aphasia, and dysarthria. These impairments are limiting patient from return to work, managing medications, managing appointments, managing finances, household responsibilities, ADLs/IADLs, and effectively communicating at home and in community. Factors affecting potential to achieve goals and functional outcome are severity of impairments. Patient will benefit from skilled SLP services to address above impairments and improve overall function.  REHAB POTENTIAL: Excellent  PLAN: SLP FREQUENCY: 2x/week  SLP DURATION: 12 weeks  PLANNED INTERVENTIONS: Language facilitation, Cueing hierachy, Internal/external aids, Functional tasks, Multimodal communication approach, and SLP instruction and feedback   Keiva Dina B. Dreama Saa, M.S., CCC-SLP, Tree surgeon Certified Brain Injury Specialist [facility]  [facility] Services Office [phone] Ascom [phone] Fax [phone]

## 2021-08-24 ENCOUNTER — Encounter: Payer: Managed Care, Other (non HMO) | Admitting: Speech Pathology

## 2021-08-24 ENCOUNTER — Encounter: Payer: Managed Care, Other (non HMO) | Admitting: Occupational Therapy

## 2021-08-25 ENCOUNTER — Ambulatory Visit: Payer: Medicaid Other | Admitting: Physical Therapy

## 2021-08-25 ENCOUNTER — Ambulatory Visit: Payer: Medicaid Other | Admitting: Speech Pathology

## 2021-08-25 ENCOUNTER — Encounter: Payer: Self-pay | Admitting: Physical Therapy

## 2021-08-25 DIAGNOSIS — I63532 Cerebral infarction due to unspecified occlusion or stenosis of left posterior cerebral artery: Secondary | ICD-10-CM

## 2021-08-25 DIAGNOSIS — R262 Difficulty in walking, not elsewhere classified: Secondary | ICD-10-CM

## 2021-08-25 DIAGNOSIS — R482 Apraxia: Secondary | ICD-10-CM

## 2021-08-25 DIAGNOSIS — R2681 Unsteadiness on feet: Secondary | ICD-10-CM

## 2021-08-25 DIAGNOSIS — M6281 Muscle weakness (generalized): Secondary | ICD-10-CM

## 2021-08-25 DIAGNOSIS — R4701 Aphasia: Secondary | ICD-10-CM

## 2021-08-25 NOTE — Therapy (Signed)
OUTPATIENT SPEECH LANGUAGE PATHOLOGY TREATMENT NOTE   Patient Name: Cheyenne Martinez MRN: [id number] DOB:[birth year], 46 y.o., female Today's Date: 08/25/2021  PCP: No PCP REFERRING PROVIDER: Theora Master, MD   End of Session - 08/25/21 1002     Visit Number 11    Number of Visits 25    Date for SLP Re-Evaluation 10/13/21    Authorization Type [place] Medicaid Prepaid [employer]    Authorization Time Period 08/01/2021 thru 11/29/2021    Authorization - Visit Number 8    Authorization - Number of Visits 16    Progress Note Due on Visit 10    SLP Start Time 1000    SLP Stop Time  1100    SLP Time Calculation (min) 60 min    Activity Tolerance Patient tolerated treatment well             No past medical history on file. No past surgical history on file. 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 Evolving early subacute left PCA distribution infarct as above,  with additional scattered small volume ischemic changes within the  left frontotemporal and right parietal lobe as above. Associated  mild petechial blood products at the left occipital lobe without  frank hemorrhagic transformation or significant mass effect.  2. Underlying mild chronic  microvascular ischemic disease with  chronic right cerebellar infarct.    07/04/2021 CTA HEAD NECK W WO CONTRAST  IMPRESSION:  Evolving recent infarcts as seen on recent MRI. There is likely mild  petechial hemorrhage associated with the left parieto-occipital  infarct. No significant mass effect.  Plaque at the left greater than right ICA origins without  hemodynamically significant stenosis. Marked stenosis at the right  vertebral origin without apparent flow limitation.  Intracranial atherosclerosis involving anterior and posterior  circulations. No proximal intracranial vessel occlusion.    TODAY'S TREATMENT:  Skilled treatment session focused on pt's expressive and receptive language abilities. SLP facilitated session by providing the following interventions:  SLP facilitated the session by using the TalkPath Therapy App.  Auditory Word ID: level 1: 70% Matching letters: Level 1: 100%; Level 2 incomplete as pt appeared to disengage in task   PATIENT EDUCATION: Education details: consistent carryover of concepts within session Person educated: patient and her mother Education method: Explanation, Demonstration, and Verbal cues Education comprehension: needs further education   GOALS: Goals reviewed with patient? YES  SHORT TERM GOALS: Target date: 10 sessions  Given maximal multimodal cues, pt will increase speech approximation by repeating rote conversational words with 50% intelligibility. Baseline: unintelligible despite maximal cues Goal status: 25%; ongoing  2.  Pt will use a SGD to answer simple biographical questions at 50% accuracy given frequent maximal verbal and maximal visual cues. Baseline: no accuracy Goal status: discontinued, pt uninterested in using SGD  3.  Pt will repeat single words such as family members' names in 50% of opportunities given frequent maximal verbal and maximal visual cues. Baseline: previous goal discontinued as pt didn't desire  to use SGD Goal status: INITIAL (08/22/2021)   LONG TERM GOALS: Target date: 10/15/2021    Using multimodal communication, pt will express basic wants and needs in 8 out of 10 opportunities.  Baseline: unable Goal status: INITIAL  2.  Using multimodal communication, pt will answer basic questions in 8 out of 10 opportunities.  Baseline: unable Goal status: INITIAL  ASSESSMENT:  CLINICAL IMPRESSION: During conversation with pt and her mother, pt, using gestures, answering yes/no questions and verbal communication, indicated that she was not interested in continuing ST services. Encouragement and support provided, but pt continued to indicate that she didn't wish to continue services. She was agreeable to being on hold for 4 weeks to allow for additional time to cope with her stroke and it deficits.   OBJECTIVE IMPAIRMENTS include expressive language, receptive language, aphasia, and dysarthria. These impairments are limiting patient from return to work, managing medications, managing appointments, managing finances, household responsibilities, ADLs/IADLs, and effectively communicating at home and in community. Factors affecting potential to achieve goals and functional outcome are severity of impairments. Patient will benefit from skilled SLP services to address above impairments and improve overall function.  REHAB POTENTIAL: Excellent  PLAN: SLP FREQUENCY: 2x/week - pt placed on hold for 4 weeks  SLP DURATION: 12 weeks  PLANNED INTERVENTIONS: Language facilitation, Cueing hierachy, Internal/external aids, Functional tasks, Multimodal communication approach, and SLP instruction and feedback   Taevion Sikora B. Dreama Saa, M.S., CCC-SLP, Tree surgeon Certified Brain Injury Specialist [facility]  [facility] Rehabilitation Services Office [phone] Ascom [phone] Fax [phone]

## 2021-08-25 NOTE — Therapy (Signed)
OUTPATIENT PHYSICAL THERAPY NEURO TREATMENT NOTE   Patient Name: Cheyenne Martinez MRN: [id number] DOB:May 20, 1975, 46 y.o., female Today's Date: 08/25/2021   PCP: Morene Crocker MD REFERRING PROVIDER: Morene Crocker MD   PT End of Session - 08/25/21 1251     Visit Number 5    Number of Visits 24    Date for PT Re-Evaluation 10/12/21    Authorization Type 1/10 eval 07/20/21    PT Start Time 1100    PT Stop Time 1145    PT Time Calculation (min) 45 min    Equipment Utilized During Treatment Gait belt    Activity Tolerance Patient tolerated treatment well    Behavior During Therapy Restless                History reviewed. No pertinent past medical history. History reviewed. No pertinent surgical history. Patient Active Problem List   Diagnosis Date Noted   CVA (cerebral vascular accident) (HCC) 07/01/2021   New onset type 2 diabetes mellitus (HCC) 07/01/2021   Dyslipidemia 07/01/2021   Hypertensive urgency 07/01/2021     ONSET DATE: 07/01/21  REFERRING DIAG: CVA  THERAPY DIAG:  Difficulty in walking, not elsewhere classified  Unsteadiness on feet  Muscle weakness (generalized)  Rationale for Evaluation and Treatment Rehabilitation  SUBJECTIVE:                                                                                                                                                                                              SUBJECTIVE STATEMENT:  Pt reports no falls/stumbles since last visit. Pt presents with new AFO following orthotist appointment this AM.   Pt accompanied by: family member, mother [name]  PERTINENT HISTORY: Patient is a 46 year old female who presented to ER with AMS on 07/01/21; MRI showed early subacute left PCA distribution infarct with ischemic changes within the left frontotemporal and right parietal lobe. Associated mild petechial blood products at the left occipital lobe without frank hemorrhagic transformation or significant  mass effect. 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     Patient also has new diagnosis of hypertension and diabetes. Per patient's evaluation with OT. Patient's daughter is now living with patient s/p stroke. Patient is very aphasic.  Patient has had one fall. Patient was working at [employer] and independent prior to stroke. Patient is walking without anything.   PAIN:  Are you having pain? No  PRECAUTIONS: Fall  WEIGHT BEARING RESTRICTIONS: No  PLOF: Independent  PATIENT GOALS: to get stronger and use RLE   OBJECTIVE:  The following measure  were taken from previous session notes unless otherwise noted.  DIAGNOSTIC FINDINGS: MRI showed early subacute left PCA distribution infarct with ischemic changes within the left frontotemporal and right parietal lobe. Associated mild petechial blood products at the left occipital lobe without frank hemorrhagic transformation or significant mass effect. 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  COGNITION: Overall cognitive status: Impaired   SENSATION: Light touch: Impaired  on LLE  COORDINATION: Impaired on LLE, limited coordination and spatial awareness.    TODAY'S TREATMENT:  08/18/2021:   NuStep: 6 mins total, lvl 1 (seat 8, no UE ( holds on to middle support with UE), goal to maintain > 50 spm, increased time taken for setup, HHA provided by SPT for maintaining RUE on handle, frequent cuing for increased SPM to maintain therapeutic intensity  -Pt challenged with open ended questions, able to verbalize some answers well but has difficulty with others. Cadence / spm slowed with questions.   Ambulation for endurance with QC: 157 ft with CGA and with new AFO donned, step to gait pattern with cues for proper cane step length, good sequencing of cane use    Alternating LAQ: 1 x 10x, 2.5# AW, with PT hands for  ext cue for full rom, some hip flexion compensation on the left.   Sitting on dyna disc for promoting trunk stability and endurance  Seated march: 2 x 20, alternating LE, 2.5# AW donned  STS: 2 x 10, cues for foot placement, brief hold in standing position to work on standing balance with transfers and everydat tasks  -first set uni UE, second set UE on knee, good ability to complete both     Word game standing finding all red letters on white board, requirs 2 examples of red letters but then was able to notice red letters and move them to appropriate white board position. Good balance throughout without UE support   Marching x 10 ea LE standing with 2.5# AW, fatigue noted and seated rest break following       Step taps x 10 with R LE as stance LE    Balance progression x 30 seconds R LE on floor, L LE on 4 inch step      Pt tolerates all interventions well without pain or excessive fatigue. Does require VC/TC throughout for correct performance of interventions/activities.      PATIENT EDUCATION: Education details: Pt educated throughout session about proper posture and technique with exercises. Improved exercise technique, movement at target joints, use of target muscles after min to mod verbal, visual, tactile cues.  Person educated: Patient and Caregiver Education method: Explanation, Demonstration, Tactile cues, and Verbal cues Education comprehension: returned demonstration, verbal cues required, tactile cues required, and needs further education   HOME EXERCISE PROGRAM:  08/18/21: No updates  Access Code: [email or web address] URL: [email or web address] Date: 07/20/2021 Prepared by: Precious Bard  Exercises - Leg Extension  - 1 x daily - 7 x weekly - 2 sets - 10 reps - 5 hold - Seated March  - 1 x daily - 7 x weekly - 2 sets - 10 reps - 5 hold - Seated Hip Abduction  - 1 x daily - 7 x weekly - 2 sets - 10 reps - 5 hold - Seated Hip Adduction Isometrics with Ball  - 1 x  daily - 7 x weekly - 2 sets - 10 reps - 5 hold    GOALS: Goals reviewed with patient? Yes  SHORT TERM  GOALS: Target date: 08/17/2021  Patient will be independent in home exercise program to improve strength/mobility for better functional independence with ADLs. Baseline: 6/21: HEP given  Goal status: INITIAL  2.  Patient will deny any falls over past 4 weeks to demonstrate improved safety awareness at home and work.  Baseline: 6/21: 1 fall  Goal status: INITIAL  LONG TERM GOALS: Target date: 10/12/2021  Patient will increase FOTO score to equal to or greater than  68   to demonstrate statistically significant improvement in mobility and quality of life.  Baseline: 6/21: 49 Goal status: INITIAL  2.   Patient (< 28 years old) will complete five times sit to stand test in < 10 seconds indicating an increased LE strength and improved balance Baseline: 6/21: 20 seconds with heavy LUE support Goal status: INITIAL  3.  Patient will increase 10 meter walk test to >1.20m/s as to improve gait speed for better community ambulation and to reduce fall risk. Baseline: 6/21 : 26 seconds with 3 LOB  Goal status: INITIAL  4.  Patient will increase BLE gross strength to 4+/5 as to improve functional strength for independent gait, increased standing tolerance and increased ADL ability. Baseline: 6/21: see note Goal status: INITIAL   ASSESSMENT:  CLINICAL IMPRESSION: Continued with current plan of care as laid out in evaluation and recent prior sessions. Pt remains motivated to advance progress toward goals in order to maximize independence and safety at home. Pt requires high level assistance and cuing for completion of exercises in order to provide adequate level of stimulation and perturbation. Author allows pt as much opportunity as possible to perform independent righting strategies, only stepping in when pt is unable to prevent falling to floor. Pt closely monitored throughout session for safe  vitals response and to maximize patient safety during interventions.Pt followed directions well this date. Pt continues to demonstrate progress toward goals AEB progression of some interventions this date either in volume or intensity.Pt will continue to benefit from skilled physical therapy intervention to address impairments, improve QOL, and attain therapy goals.   OBJECTIVE IMPAIRMENTS Abnormal gait, decreased activity tolerance, decreased balance, decreased cognition, decreased coordination, decreased endurance, decreased knowledge of condition, decreased knowledge of use of DME, decreased mobility, difficulty walking, decreased strength, decreased safety awareness, and increased fascial restrictions.   ACTIVITY LIMITATIONS carrying, lifting, bending, standing, squatting, stairs, transfers, hygiene/grooming, locomotion level, and caring for others  PARTICIPATION LIMITATIONS: cleaning, laundry, interpersonal relationship, driving, shopping, community activity, occupation, yard work, and church  PERSONAL FACTORS Age, Behavior pattern, Education, Past/current experiences, Social background, Time since onset of injury/illness/exacerbation, Transportation, and 3+ comorbidities: CVA, HTN, dyslipidemia, DM type II  are also affecting patient's functional outcome.   REHAB POTENTIAL: Fair    CLINICAL DECISION MAKING: Evolving/moderate complexity  EVALUATION COMPLEXITY: Moderate  PLAN: PT FREQUENCY: 2x/week  PT DURATION: 12 weeks  PLANNED INTERVENTIONS: Therapeutic exercises, Therapeutic activity, Neuromuscular re-education, Balance training, Gait training, Patient/Family education, Joint mobilization, Stair training, Vestibular training, Canalith repositioning, Orthotic/Fit training, DME instructions, Cognitive remediation, Electrical stimulation, Cryotherapy, Moist heat, Splintting, Taping, Vasopneumatic device, Traction, Ultrasound, Biofeedback, and Manual therapy  PLAN FOR NEXT SESSION:  strengthening, endurance, and gait interventions    Norman Herrlich PT  08/25/2021, 12:52 PM

## 2021-08-29 ENCOUNTER — Encounter: Payer: Self-pay | Admitting: Occupational Therapy

## 2021-08-29 ENCOUNTER — Ambulatory Visit: Payer: Medicaid Other | Admitting: Occupational Therapy

## 2021-08-29 ENCOUNTER — Ambulatory Visit: Payer: Medicaid Other | Admitting: Speech Pathology

## 2021-08-29 DIAGNOSIS — R4701 Aphasia: Secondary | ICD-10-CM | POA: Diagnosis not present

## 2021-08-29 DIAGNOSIS — M6281 Muscle weakness (generalized): Secondary | ICD-10-CM

## 2021-08-29 DIAGNOSIS — R278 Other lack of coordination: Secondary | ICD-10-CM

## 2021-08-29 NOTE — Therapy (Signed)
OUTPATIENT OCCUPATIONAL THERAPY TREATMENT NOTE   Patient Name: Cheyenne Martinez MRN: [id number] DOB:Aug 15, 1975, 46 y.o., female Today's Date: 08/29/2021  REFERRING PROVIDER: Theora Master, MD   OT End of Session - 08/29/21 1428     Visit Number 8    Number of Visits 24    Date for OT Re-Evaluation 10/09/21    Authorization Time Period Reporting period beginning 07/18/21    OT Start Time 1213    OT Stop Time 1255    OT Time Calculation (min) 42 min    Activity Tolerance Patient tolerated treatment well    Behavior During Therapy Restless             History reviewed. No pertinent past medical history. History reviewed. No pertinent surgical history. Patient Active Problem List   Diagnosis Date Noted   CVA (cerebral vascular accident) (HCC) 07/01/2021   New onset type 2 diabetes mellitus (HCC) 07/01/2021   Dyslipidemia 07/01/2021   Hypertensive urgency 07/01/2021      REFERRING DIAG: CVA  THERAPY DIAG:  Muscle weakness (generalized)  Other lack of coordination  Rationale for Evaluation and Treatment Rehabilitation  PERTINENT HISTORY: 46 y.o. African-American female with no chronic medical problems, who presented to the ER with acute onset of altered mental status and difficulty finding words with occasional slurred speech over the last couple of days.  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 Associated mild petechial blood products at the left occipital lobe without frank hemorrhagic transformation or significant mass effect. 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 Patient also has new diagnosis of hypertension and diabetes with A1c of 11.2.  Lipid profile with elevated cholesterol at 238, triglyceride 159, HDL 39 and LDL 169 with goal less than  70.  PRECAUTIONS: None  SUBJECTIVE:  Pt.  Arrived late for the therapy session today today due to being stuck in traffic. PAIN:  Are you having pain? No  OBJECTIVE:   TODAY'S TREATMENT:   Pt. tolerated RUE AROM for right shoulder flexion, and abduction. Pt. performed wrist extension with resistance, as well as digit extension with the digits placed flat at the tabletop surface. Pt. worked on AROM for thumb radial, and palmar abduction. Pt. worked on bilateral hand coordination opening various sized bottle tops with the right hand, while stabilizing with the left hand. Pt. worked on isolating 2nd digit extension in preparation for sliding flat objects off of the edge of an elevated surface. Pt. worked from larger flat objects to smaller objects starting with 1" checkers, then worked towards progressing to quarters, and nickels. Pt. worked on grasping 2" long sticks. Pt. worked on removing the sticks using a lateral grasp, and then reinserting them into a pegboard placed flat at the tabletop surface.  Pt. continues to make steady progress, and is consistently attempting to engage her right hand more during daily tasks at home. Pt. requires visual cues, verbal cues, and cues for visual demonstration for each step of the task. Pt. continues to improve with motor planning through movements with consistent cues. Pt. Consistent cues for isolating the 2nd digit when sliding the flat objects towards the edge of the surface to the thumb. Pt. requires cues to avoid using the left hand to turn the base of the bottles when attempting to use bilateral hands to open the bottles with the left stabilizing the  base while opening them with the right hand. Pt. continues to work on improving UE strength, and [facility] skills in order to work towards improving RUE functioning, and maximizing engagement of the RUE, and overall independence with ADLs, and IADL tasks.   PATIENT EDUCATION: Education details: Pt.caregiver education  about  RUE ROM, and opportunities for engagement of the RUE, and hand during ADL/IADL tasks. Person educated: Patient and Caregiver Education method: Explanation, Demonstration, and Verbal cues Education comprehension: verbalized understanding, returned demonstration, verbal cues required, and needs further education   HOME EXERCISE PROGRAM Pt. currently has a HEP for right hand strengthening with light blue theraputty. Pt./brother education about daily UE ROM.   OT Short Term Goals - 07/18/21 1444       OT SHORT TERM GOAL #1   Title Pt will perform RUE HEP for increasing strength and coordination with min vc.    Baseline Eval: initiated at eval; requires mod A with gripping and rolling putty    Time 6    Period Weeks    Status New    Target Date 08/28/21      OT SHORT TERM GOAL #2   Title Family will provide min vc to increase attention to R side for pt to more easily navigate her environment.    Baseline Eval: initiated education at eval as family was unaware of any visual decline (pt presents with mild R sided inattention and possible R field cut)    Time 6    Period Weeks    Status New    Target Date 08/28/21              OT Long Term Goals - 07/18/21 1447       OT LONG TERM GOAL #1   Title Pt will increase FOTO score to 45 or better to indicate perceived increased performance with daily tasks.    Baseline Eval: FOTO 36    Time 12    Period Weeks    Status New    Target Date 10/09/21      OT LONG TERM GOAL #2   Title Pt will increase RUE MMT by 1 muscle grade for improved engagement of RUE into self care tasks.    Baseline Eval: Pt using L non-dominant arm for all daily tasks; R shoulder grossly 3-, elbow 4-, wrist 3+    Time 12    Period Weeks    Status New    Target Date 10/09/21      OT LONG TERM GOAL #3   Title Pt will increase R grip strength by 7# to enable pt to hold and carry light ADL supplies in R dominant hand.    Baseline Eval: R 0, L 72    Time  12    Period Weeks    Status New    Target Date 10/09/21      OT LONG TERM GOAL #4   Title Pt will write name with R dominant hand with 50% legibility    Baseline Eval: pt can hold pen in R hand with OT assisting with set up, but pt can not make a mark on the paper    Time 12    Period Weeks    Status New    Target Date 10/09/21      OT LONG TERM GOAL #5   Title Pt will improve R hand dexterity for picking up pills with dominant hand.    Baseline Eval: R 9 hole unable (uses L non-dominant  hand to pick up pills)    Time 12    Period Weeks    Status New    Target Date 10/09/21              Plan - 07/28/21 1149     Clinical Impression Statement Pt. continues to make steady progress, and is consistently attempting to engage her right hand more during daily tasks at home. Pt. requires visual cues, verbal cues, and cues for visual demonstration for each step of the task. Pt. continues to improve with motor planning through movements with consistent cues. Pt. Consistent cues for isolating the 2nd digit when sliding the flat objects towards the edge of the surface to the thumb. Pt. requires cues to avoid using the left hand to turn the base of the bottles when attempting to use bilateral hands to open the bottles with the left stabilizing the base while opening them with the right hand. Pt. continues to work on improving UE strength, and [facility] skills in order to work towards improving RUE functioning, and maximizing engagement of the RUE, and overall independence with ADLs, and IADL tasks.       OT Occupational Profile and History Problem Focused Assessment - Including review of records relating to presenting problem    Occupational performance deficits (Please refer to evaluation for details): ADL's;IADL's;Work;Leisure    Body Structure / Function / Physical Skills ADL;GMC;ROM;UE functional use;Balance;Endurance;FMC;Pain;Strength;Coordination;Dexterity;Gait;IADL;Sensation;Tone;Vision     Cognitive Skills Attention;Safety Awareness;Understand;Emotional;Temperament/Personality    Rehab Potential Good    Clinical Decision Making Several treatment options, min-mod task modification necessary    Comorbidities Affecting Occupational Performance: None    Modification or Assistance to Complete Evaluation  Min-Moderate modification of tasks or assist with assess necessary to complete eval    OT Frequency 2x / week    OT Duration 12 weeks    OT Treatment/Interventions Self-care/ADL training;Therapeutic exercise;Building services engineer;Therapeutic activities;Visual/perceptual remediation/compensation;Balance training;Coping strategies training;Cryotherapy;Moist Heat;Neuromuscular education;DME and/or AE instruction;Manual Therapy;Passive range of motion;Patient/family education    OT Home Exercise Plan Initiated theraputty exercises for gripping and rolling putty with RUE    Consulted and Agree with Plan of Care Patient;Family member/caregiver    Family Member Consulted Mother, [name] and daughter, Newton Pigg, MS, OTR/L   Olegario Messier, [place] 08/29/2021, 2:36 PM

## 2021-08-31 ENCOUNTER — Encounter: Payer: Managed Care, Other (non HMO) | Admitting: Speech Pathology

## 2021-08-31 ENCOUNTER — Encounter: Payer: Self-pay | Admitting: Occupational Therapy

## 2021-08-31 ENCOUNTER — Ambulatory Visit: Payer: Medicaid Other | Attending: Neurology | Admitting: Occupational Therapy

## 2021-08-31 DIAGNOSIS — M6281 Muscle weakness (generalized): Secondary | ICD-10-CM | POA: Insufficient documentation

## 2021-08-31 DIAGNOSIS — I63532 Cerebral infarction due to unspecified occlusion or stenosis of left posterior cerebral artery: Secondary | ICD-10-CM | POA: Insufficient documentation

## 2021-08-31 DIAGNOSIS — R2681 Unsteadiness on feet: Secondary | ICD-10-CM | POA: Insufficient documentation

## 2021-08-31 DIAGNOSIS — R278 Other lack of coordination: Secondary | ICD-10-CM | POA: Insufficient documentation

## 2021-08-31 DIAGNOSIS — R262 Difficulty in walking, not elsewhere classified: Secondary | ICD-10-CM | POA: Insufficient documentation

## 2021-08-31 DIAGNOSIS — R482 Apraxia: Secondary | ICD-10-CM | POA: Insufficient documentation

## 2021-08-31 NOTE — Therapy (Signed)
[place] [facility] MAIN [facility] SERVICES [address] [place], [place], [address] Phone: [phone]   Fax:  [phone]  Patient Details  Name: Cheyenne Martinez MRN: [id number] Date of Birth: 04/29/1975 Referring Provider:  No ref. provider found  Encounter Date: 08/31/2021  Pt. Did not arrive for her OT session this morning. Attempted to reach ou to the Pt. Via phone, however was unable to leave a voice message.    Olegario Messier, OT, MS, OTR/L 08/31/2021, 12:13 PM   [facility] MAIN [facility] SERVICES [address] [place], [place], [address] Phone: [phone]   Fax:  [phone]

## 2021-09-05 ENCOUNTER — Encounter: Payer: Managed Care, Other (non HMO) | Admitting: Speech Pathology

## 2021-09-05 ENCOUNTER — Ambulatory Visit: Payer: Medicaid Other | Admitting: Occupational Therapy

## 2021-09-05 DIAGNOSIS — R262 Difficulty in walking, not elsewhere classified: Secondary | ICD-10-CM | POA: Diagnosis not present

## 2021-09-05 DIAGNOSIS — M6281 Muscle weakness (generalized): Secondary | ICD-10-CM | POA: Diagnosis present

## 2021-09-05 DIAGNOSIS — R278 Other lack of coordination: Secondary | ICD-10-CM | POA: Diagnosis present

## 2021-09-05 DIAGNOSIS — I63532 Cerebral infarction due to unspecified occlusion or stenosis of left posterior cerebral artery: Secondary | ICD-10-CM | POA: Diagnosis not present

## 2021-09-05 DIAGNOSIS — R2681 Unsteadiness on feet: Secondary | ICD-10-CM | POA: Diagnosis present

## 2021-09-05 DIAGNOSIS — R482 Apraxia: Secondary | ICD-10-CM | POA: Diagnosis not present

## 2021-09-05 NOTE — Therapy (Deleted)
[place] [facility] MAIN [facility] SERVICES [address] [place], [place], [address] Phone: [phone]   Fax:  [phone]  Patient Details  Name: Cheyenne Martinez MRN: [id number] Date of Birth: 1975/04/14 Referring Provider:  Morene Crocker, MD  Encounter Date: 09/05/2021  Pt. Did not arrive for her OT session this morning. Attempted to reach ou to the Pt. Via phone, however was unable to leave a voice message.    Olegario Messier, OT, MS, OTR/L 09/05/2021, 1:01 PM  [place] [facility] MAIN [facility] SERVICES [address] [place], [place], [address] Phone: [phone]   Fax:  [phone]

## 2021-09-05 NOTE — Therapy (Addendum)
OUTPATIENT OCCUPATIONAL THERAPY TREATMENT NOTE   Patient Name: Cheyenne Martinez MRN: [id number] DOB:[birth year], 46 y.o., female Today's Date: 09/05/2021  REFERRING PROVIDER: Theora Master, MD   OT End of Session - 09/05/21 1255     Visit Number 9    Number of Visits 24    Date for OT Re-Evaluation 10/09/21    Authorization Time Period Reporting period beginning 07/18/21    OT Start Time 1145   OT Stop Time 1227   OT Time Calculation (min) 42 min.   Activity Tolerance Patient tolerated treatment well    Behavior During Therapy Restless             No past medical history on file. No past surgical history on file. Patient Active Problem List   Diagnosis Date Noted   CVA (cerebral vascular accident) (HCC) 07/01/2021   New onset type 2 diabetes mellitus (HCC) 07/01/2021   Dyslipidemia 07/01/2021   Hypertensive urgency 07/01/2021      REFERRING DIAG: CVA  THERAPY DIAG:  Muscle weakness (generalized)  Rationale for Evaluation and Treatment Rehabilitation  PERTINENT HISTORY: 46 y.o. African-American female with no chronic medical problems, who presented to the ER with acute onset of altered mental status and difficulty finding words with occasional slurred speech over the last couple of days.  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 Associated mild petechial blood products at the left occipital lobe without frank hemorrhagic transformation or significant mass effect. 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 Patient also has new diagnosis of hypertension and diabetes with A1c of 11.2.  Lipid profile with elevated cholesterol at 238, triglyceride 159, HDL 39 and LDL 169 with goal less than 70.  PRECAUTIONS: None  SUBJECTIVE:  Pt.  Arrived late for the therapy session  today today due to being stuck in traffic. PAIN:  Are you having pain? No  OBJECTIVE:   TODAY'S TREATMENT:   Pt. tolerated RUE AROM for right shoulder flexion, and abduction. Pt. performed wrist extension with resistance, as well as digit extension with the digits placed flat at the tabletop surface. Pt. worked on AROM for thumb radial, and palmar abduction. Pt. worked on bilateral hand coordination opening various sized bottle tops with the right hand, while stabilizing with the left hand. Pt. worked on right hand pinch strengthening in the left hand for lateral, and 3pt. pinch using yellow, and red resistive clips.  Pt. worked on placing the clips onto a horizontal dowel. Tactile and verbal cues were required for eliciting the desired movement. Pt. worked on grasping 1" resistive cubes from a resistive board using lateral, and 3pt. Grasp patterns.  Pt. Worked on hanging them onto a horizontal dowel. Pt. worked on [employer], and reciprocal motion using the UBE while seated for 6 min. with no resistance for both forward, and reverse motion. Constant monitoring was provided.   Pt. continues to make steady progress, and consistently attempts to engage her right hand more during daily tasks at home. Pt. requires visual cues, verbal cues, and cues for visual demonstration for each step of the task. Pt. continues to work on improving motor planning through movements with consistent cues. Pt. requires consistent verbal cues, and cues for visual demonstration and motor planning through each movement for form, and technique. Pt. continues to work on improving UE strength, and [facility] skills in order  to work towards improving RUE functioning, and maximizing engagement of the RUE, and overall independence with ADLs, and IADL tasks.   PATIENT EDUCATION: Education details: Pt.caregiver education about  RUE ROM, and opportunities for engagement of the RUE, and hand during ADL/IADL tasks. Person educated:  Patient and Caregiver Education method: Explanation, Demonstration, and Verbal cues Education comprehension: verbalized understanding, returned demonstration, verbal cues required, and needs further education   HOME EXERCISE PROGRAM Pt. currently has a HEP for right hand strengthening with light blue theraputty. Pt./brother education about daily UE ROM.   OT Short Term Goals - 07/18/21 1444       OT SHORT TERM GOAL #1   Title Pt will perform RUE HEP for increasing strength and coordination with min vc.    Baseline Eval: initiated at eval; requires mod A with gripping and rolling putty    Time 6    Period Weeks    Status New    Target Date 08/28/21      OT SHORT TERM GOAL #2   Title Family will provide min vc to increase attention to R side for pt to more easily navigate her environment.    Baseline Eval: initiated education at eval as family was unaware of any visual decline (pt presents with mild R sided inattention and possible R field cut)    Time 6    Period Weeks    Status New    Target Date 08/28/21              OT Long Term Goals - 07/18/21 1447       OT LONG TERM GOAL #1   Title Pt will increase FOTO score to 45 or better to indicate perceived increased performance with daily tasks.    Baseline Eval: FOTO 36    Time 12    Period Weeks    Status New    Target Date 10/09/21      OT LONG TERM GOAL #2   Title Pt will increase RUE MMT by 1 muscle grade for improved engagement of RUE into self care tasks.    Baseline Eval: Pt using L non-dominant arm for all daily tasks; R shoulder grossly 3-, elbow 4-, wrist 3+    Time 12    Period Weeks    Status New    Target Date 10/09/21      OT LONG TERM GOAL #3   Title Pt will increase R grip strength by 7# to enable pt to hold and carry light ADL supplies in R dominant hand.    Baseline Eval: R 0, L 72    Time 12    Period Weeks    Status New    Target Date 10/09/21      OT LONG TERM GOAL #4   Title Pt will write  name with R dominant hand with 50% legibility    Baseline Eval: pt can hold pen in R hand with OT assisting with set up, but pt can not make a mark on the paper    Time 12    Period Weeks    Status New    Target Date 10/09/21      OT LONG TERM GOAL #5   Title Pt will improve R hand dexterity for picking up pills with dominant hand.    Baseline Eval: R 9 hole unable (uses L non-dominant hand to pick up pills)    Time 12    Period Weeks    Status New  Target Date 10/09/21              Plan - 07/28/21 1149     Clinical Impression Statement Pt. continues to make steady progress, and consistently attempts to engage her right hand more during daily tasks at home. Pt. requires visual cues, verbal cues, and cues for visual demonstration for each step of the task. Pt. continues to work on improving motor planning through movements with consistent cues. Pt. requires consistent verbal cues, and cues for visual demonstration and motor planning through each movement for form, and technique. Pt. continues to work on improving UE strength, and [facility] skills in order to work towards improving RUE functioning, and maximizing engagement of the RUE, and overall independence with ADLs, and IADL tasks.         OT Occupational Profile and History Problem Focused Assessment - Including review of records relating to presenting problem    Occupational performance deficits (Please refer to evaluation for details): ADL's;IADL's;Work;Leisure    Body Structure / Function / Physical Skills ADL;GMC;ROM;UE functional use;Balance;Endurance;FMC;Pain;Strength;Coordination;Dexterity;Gait;IADL;Sensation;Tone;Vision    Cognitive Skills Attention;Safety Awareness;Understand;Emotional;Temperament/Personality    Rehab Potential Good    Clinical Decision Making Several treatment options, min-mod task modification necessary    Comorbidities Affecting Occupational Performance: None    Modification or Assistance to Complete  Evaluation  Min-Moderate modification of tasks or assist with assess necessary to complete eval    OT Frequency 2x / week    OT Duration 12 weeks    OT Treatment/Interventions Self-care/ADL training;Therapeutic exercise;Building services engineer;Therapeutic activities;Visual/perceptual remediation/compensation;Balance training;Coping strategies training;Cryotherapy;Moist Heat;Neuromuscular education;DME and/or AE instruction;Manual Therapy;Passive range of motion;Patient/family education    OT Home Exercise Plan Initiated theraputty exercises for gripping and rolling putty with RUE    Consulted and Agree with Plan of Care Patient;Family member/caregiver    Family Member Consulted Mother, [name] and daughter, Estil Daft [place], [place], OTR/L   Olegario Messier, [place] 09/05/2021, 1:02 PM

## 2021-09-06 ENCOUNTER — Ambulatory Visit: Payer: Medicaid Other

## 2021-09-06 DIAGNOSIS — M6281 Muscle weakness (generalized): Secondary | ICD-10-CM

## 2021-09-06 DIAGNOSIS — R262 Difficulty in walking, not elsewhere classified: Secondary | ICD-10-CM

## 2021-09-06 DIAGNOSIS — R2681 Unsteadiness on feet: Secondary | ICD-10-CM

## 2021-09-06 NOTE — Therapy (Signed)
OUTPATIENT PHYSICAL THERAPY NEURO TREATMENT NOTE   Patient Name: Cheyenne Martinez MRN: [id number] DOB:03-21-1975, 46 y.o., female Today's Date: 09/06/2021   PCP: Morene Crocker MD REFERRING PROVIDER: Morene Crocker MD   PT End of Session - 09/06/21 1339     Visit Number 6    Number of Visits 24    Date for PT Re-Evaluation 10/12/21    Authorization Type 1/10 eval 07/20/21    PT Start Time 1344    PT Stop Time 1429    PT Time Calculation (min) 45 min    Equipment Utilized During Treatment Gait belt    Activity Tolerance Patient tolerated treatment well                 History reviewed. No pertinent past medical history. History reviewed. No pertinent surgical history. Patient Active Problem List   Diagnosis Date Noted   CVA (cerebral vascular accident) (HCC) 07/01/2021   New onset type 2 diabetes mellitus (HCC) 07/01/2021   Dyslipidemia 07/01/2021   Hypertensive urgency 07/01/2021     ONSET DATE: 07/01/21  REFERRING DIAG: CVA  THERAPY DIAG:  Muscle weakness (generalized)  Difficulty in walking, not elsewhere classified  Unsteadiness on feet  Rationale for Evaluation and Treatment Rehabilitation  SUBJECTIVE:                                                                                                                                                                                              SUBJECTIVE STATEMENT:  Pt presents with family. Had OT day prior to PT session.    Pt accompanied by: family member, mother [name]  PERTINENT HISTORY: Patient is a 46 year old female who presented to ER with AMS on 07/01/21; MRI showed early subacute left PCA distribution infarct with ischemic changes within the left frontotemporal and right parietal lobe. Associated mild petechial blood products at the left occipital lobe without frank hemorrhagic transformation or significant mass effect. Also noted was underlying mild chronic microvascular ischemic disease with  chronic right cerebellar infarct.     Patient also has new diagnosis of hypertension and diabetes. Per patient's evaluation with OT. Patient's daughter is now living with patient s/p stroke. Patient is very aphasic.  Patient has had one fall. Patient was working at [employer] and independent prior to stroke. Patient is walking without anything.   PAIN:  Are you having pain? No  PRECAUTIONS: Fall  WEIGHT BEARING RESTRICTIONS: No  PLOF: Independent  PATIENT GOALS: to get stronger and use RLE   OBJECTIVE:  The following measure were taken from previous session notes unless otherwise noted.  DIAGNOSTIC  FINDINGS: MRI showed early subacute left PCA distribution infarct with ischemic changes within the left frontotemporal and right parietal lobe. Associated mild petechial blood products at the left occipital lobe without frank hemorrhagic transformation or significant mass effect. 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  COGNITION: Overall cognitive status: Impaired   SENSATION: Light touch: Impaired  on LLE  COORDINATION: Impaired on LLE, limited coordination and spatial awareness.    TODAY'S TREATMENT:  Ambulation with QC 120 ft with CGA and with new AFO donned, step to gait pattern that improved to step through. Vertical and horizontal head turns scanning room to grab 7 cones and pass to PT. X2 trials    STS:  10x; 2 sets, cues for foot placement, brief hold in standing position to work on standing balance with transfers and everyday tasks     Standing Step taps x 15 each LE  ; LUE support   Alternating LAQ: 2 x 10x, 3# AW, with PT hands for ext cue for full rom, some hip flexion compensation on the left.   Seated march: 2 x 20, alternating LE, 3# AW donned     Lateral step over hedgehog 12x each LE seated ; x 2 trials    Pt tolerates all interventions well without pain or excessive fatigue. Does require VC/TC  throughout for correct performance of interventions/activities.      PATIENT EDUCATION: Education details: Pt educated throughout session about proper posture and technique with exercises. Improved exercise technique, movement at target joints, use of target muscles after min to mod verbal, visual, tactile cues.  Person educated: Patient and Caregiver Education method: Explanation, Demonstration, Tactile cues, and Verbal cues Education comprehension: returned demonstration, verbal cues required, tactile cues required, and needs further education   HOME EXERCISE PROGRAM:  08/18/21: No updates  Access Code: [email or web address] URL: [email or web address] Date: 07/20/2021 Prepared by: Precious Bard  Exercises - Leg Extension  - 1 x daily - 7 x weekly - 2 sets - 10 reps - 5 hold - Seated March  - 1 x daily - 7 x weekly - 2 sets - 10 reps - 5 hold - Seated Hip Abduction  - 1 x daily - 7 x weekly - 2 sets - 10 reps - 5 hold - Seated Hip Adduction Isometrics with Ball  - 1 x daily - 7 x weekly - 2 sets - 10 reps - 5 hold    GOALS: Goals reviewed with patient? Yes  SHORT TERM GOALS: Target date: 08/17/2021  Patient will be independent in home exercise program to improve strength/mobility for better functional independence with ADLs. Baseline: 6/21: HEP given  Goal status: INITIAL  2.  Patient will deny any falls over past 4 weeks to demonstrate improved safety awareness at home and work.  Baseline: 6/21: 1 fall  Goal status: INITIAL  LONG TERM GOALS: Target date: 10/12/2021  Patient will increase FOTO score to equal to or greater than  68   to demonstrate statistically significant improvement in mobility and quality of life.  Baseline: 6/21: 49 Goal status: INITIAL  2.   Patient (< 44 years old) will complete five times sit to stand test in < 10 seconds indicating an increased LE strength and improved balance Baseline: 6/21: 20 seconds with heavy LUE support Goal status:  INITIAL  3.  Patient will increase 10 meter walk test to >1.80m/s as to improve gait speed for better community ambulation and to reduce fall risk. Baseline:  6/21 : 26 seconds with 3 LOB  Goal status: INITIAL  4.  Patient will increase BLE gross strength to 4+/5 as to improve functional strength for independent gait, increased standing tolerance and increased ADL ability. Baseline: 6/21: see note Goal status: INITIAL   ASSESSMENT:  CLINICAL IMPRESSION: Patient presents with excellent motivation throughout session. She has limited spatial awareness of RLE requiring frequent cueing for visual scanning and situational awareness. She tolerates increased resistance with upgrade to 3lb ankle weights. .Pt will continue to benefit from skilled physical therapy intervention to address impairments, improve QOL, and attain therapy goals.   OBJECTIVE IMPAIRMENTS Abnormal gait, decreased activity tolerance, decreased balance, decreased cognition, decreased coordination, decreased endurance, decreased knowledge of condition, decreased knowledge of use of DME, decreased mobility, difficulty walking, decreased strength, decreased safety awareness, and increased fascial restrictions.   ACTIVITY LIMITATIONS carrying, lifting, bending, standing, squatting, stairs, transfers, hygiene/grooming, locomotion level, and caring for others  PARTICIPATION LIMITATIONS: cleaning, laundry, interpersonal relationship, driving, shopping, community activity, occupation, yard work, and church  PERSONAL FACTORS Age, Behavior pattern, Education, Past/current experiences, Social background, Time since onset of injury/illness/exacerbation, Transportation, and 3+ comorbidities: CVA, HTN, dyslipidemia, DM type II  are also affecting patient's functional outcome.   REHAB POTENTIAL: Fair    CLINICAL DECISION MAKING: Evolving/moderate complexity  EVALUATION COMPLEXITY: Moderate  PLAN: PT FREQUENCY: 2x/week  PT DURATION: 12  weeks  PLANNED INTERVENTIONS: Therapeutic exercises, Therapeutic activity, Neuromuscular re-education, Balance training, Gait training, Patient/Family education, Joint mobilization, Stair training, Vestibular training, Canalith repositioning, Orthotic/Fit training, DME instructions, Cognitive remediation, Electrical stimulation, Cryotherapy, Moist heat, Splintting, Taping, Vasopneumatic device, Traction, Ultrasound, Biofeedback, and Manual therapy  PLAN FOR NEXT SESSION: strengthening, endurance, and gait interventions    Precious Bard PT  09/06/2021, 2:33 PM

## 2021-09-08 ENCOUNTER — Encounter: Payer: Managed Care, Other (non HMO) | Admitting: Speech Pathology

## 2021-09-08 ENCOUNTER — Ambulatory Visit: Payer: Medicaid Other

## 2021-09-12 ENCOUNTER — Ambulatory Visit: Payer: Medicaid Other

## 2021-09-12 ENCOUNTER — Encounter: Payer: Managed Care, Other (non HMO) | Admitting: Speech Pathology

## 2021-09-14 ENCOUNTER — Encounter: Payer: Managed Care, Other (non HMO) | Admitting: Speech Pathology

## 2021-09-14 ENCOUNTER — Encounter: Payer: Self-pay | Admitting: Occupational Therapy

## 2021-09-14 ENCOUNTER — Ambulatory Visit: Payer: Medicaid Other | Admitting: Occupational Therapy

## 2021-09-14 ENCOUNTER — Ambulatory Visit: Payer: Medicaid Other

## 2021-09-14 DIAGNOSIS — M6281 Muscle weakness (generalized): Secondary | ICD-10-CM

## 2021-09-14 DIAGNOSIS — R278 Other lack of coordination: Secondary | ICD-10-CM

## 2021-09-14 DIAGNOSIS — R2681 Unsteadiness on feet: Secondary | ICD-10-CM

## 2021-09-14 NOTE — Therapy (Addendum)
Occupational Therapy Progress/ Note  Dates of reporting period  07/18/2021   to   09/14/2021    Patient Name: Cheyenne Martinez MRN: [id number] DOB:01-Dec-1975, 46 y.o., female Today's Date: 09/14/2021  REFERRING PROVIDER: Theora Master, MD      OT End of Session - 09/14/21 1319     Visit Number 10    Number of Visits 24    Date for OT Re-Evaluation 10/09/21    Authorization Time Period Reporting period beginning 07/18/21    OT Start Time 1315    OT Stop Time 1400    OT Time Calculation (min) 45 min    Activity Tolerance Patient tolerated treatment well    Behavior During Therapy Restless                       No past medical history on file. No past surgical history on file. Patient Active Problem List   Diagnosis Date Noted   CVA (cerebral vascular accident) (HCC) 07/01/2021   New onset type 2 diabetes mellitus (HCC) 07/01/2021   Dyslipidemia 07/01/2021   Hypertensive urgency 07/01/2021      REFERRING DIAG: CVA  THERAPY DIAG:  Muscle weakness (generalized)  Rationale for Evaluation and Treatment Rehabilitation  PERTINENT HISTORY: 46 y.o. African-American female with no chronic medical problems, who presented to the ER with acute onset of altered mental status and difficulty finding words with occasional slurred speech over the last couple of days.  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 Associated mild petechial blood products at the left occipital lobe without frank hemorrhagic transformation or significant mass effect. 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 Patient also has new diagnosis of hypertension and diabetes with A1c of 11.2.  Lipid profile with elevated cholesterol at 238, triglyceride 159, HDL 39 and LDL 169 with goal less than  70.  PRECAUTIONS: None  SUBJECTIVE:  Pt.  Arrived late for the therapy session today today due to being stuck in traffic. PAIN:  Are you having pain? No  OBJECTIVE:   TODAY'S TREATMENT:   Pt. worked on grasping 1" resistive cubes from a resistive board using lateral, and 3pt. grasp patterns. Pt.  required increased assist for attempting to isolate her 2nd digit in preparation for pressing, and securing the cubes into place.  Measurements were obtained, and goals were reviewed with the pt. Pt. has made progress overall, and is now attempting to engage her RUE during more tasks at home. Pt. Has progressed with right UE strength,  grip strength, pinch strength, and FMC skills. Pt.'s FOTO score has progressed to 48.  Pt. worked on grasping 1" resistive cubes from a resistive board using lateral, and 3pt. Grasp patterns.  Pt. Had difficulty formulating 3pt. grasp patterns with the right hand, and isolating the 2nd digit in extension. Pt. continues to present with limited RUE motor control, and [facility] skills.  Pt. Continues to work on improving RUE strength, motor control, and [facility] skills in order improve RUE functioning, and maximize independence with ADLs, and IADLs      PATIENT EDUCATION: Education details: Pt.caregiver education about  RUE ROM, and opportunities for engagement of the RUE, and hand during ADL/IADL tasks. Person educated: Patient and Caregiver Education method: Explanation, Demonstration, and Verbal cues Education comprehension: verbalized understanding, returned demonstration, verbal cues required, and needs further education  HOME EXERCISE PROGRAM Pt. currently has a HEP for right hand strengthening with light blue theraputty. Pt./brother education about daily UE ROM.  Right Hand Function:  Grip strength: 24# Lateral pinch strength: 10#   FMC with the 9 hole peg test : 2 min & 27 sec. To place 3 pegs., 43 sec. To remove 9 pegs.    OT Short Term Goals - 07/18/21 1444        OT SHORT TERM GOAL #1   Title Pt will perform RUE HEP for increasing strength and coordination with min vc.    Baseline 09/14/2021:  Pt. Is engaging her right hand during more tasks at home. Continue with ongoing pt./caregiver education. Eval: initiated at eval; requires mod A with gripping and rolling putty    Time 6    Period Weeks    Status ongoing   Target Date  10/26/2021       OT SHORT TERM GOAL #2   Title Family will provide min vc to increase attention to R side for pt to more easily navigate her environment.    Baseline 09/14/2021: Pt. Is now requiring fewer cues for right sided awareness when navigating through her environment. Eval: initiated education at eval as family was unaware of any visual decline (pt presents with mild R sided inattention and possible R field cut)    Time 6    Period Weeks    Status ongoing   Target Date 10/26/2021             OT Long Term Goals - 07/18/21 1447       OT LONG TERM GOAL #1   Title Pt will increase FOTO score to 45 or better to indicate perceived increased performance with daily tasks.    Baseline 09/14/2021: 48 Eval: FOTO 36   Time 12    Period Weeks    Status Achieved   Target Date 12/07/2021        OT LONG TERM GOAL #2   Title Pt will increase RUE MMT by 1 muscle grade for improved engagement of RUE into self care tasks.    Baseline 09/14/2021: RUE shoulder 3/5, elbow 4/5, wrist 4/5. Eval: Pt using L non-dominant arm for all daily tasks; R shoulder grossly 3-, elbow 4-, wrist 3+    Time 12    Period Weeks    Status ongoing   Target Date 12/07/2021     OT LONG TERM GOAL #3   Title Pt will increase R grip strength by 7# to enable pt. to hold and carry light ADL supplies in R dominant hand.    Baseline 09/14/2021: R: 24# Eval: R 0, L 72    Time 12    Period Weeks    Status ongoing   Target Date 12/07/2021     OT LONG TERM GOAL #4   Title Pt will write name with R dominant hand with 50% legibility    Baseline  09/14/2021: Using a built-up adaptive pen, the Pt. was able to formulate letters of her name with written text very close together, deviating above the line, and with 25% legibility.  Eval: Pt. can hold pen in R hand with OT assisting with set up, but pt can not make a mark on the paper    Time 12    Period Weeks    Status Ongoing   Target Date 12/07/2021     OT LONG TERM GOAL #5   Title Pt. will improve R hand dexterity for picking  up pills with dominant hand.    Baseline 09/14/2021:  9 hole peg test: 2 min. & 27 sec. To place 3 pegs,43. Sec. To remove 9 pegs.  Eval: R 9 hole unable (uses L non-dominant hand to pick up pills)    Time 12    Period Weeks    Status Ongoing   Target Date 12/07/2021             Plan - 07/28/21 1149     Clinical Impression Statement Measurements were obtained, and goals were reviewed with the pt. Pt. has made progress overall, and is now attempting to engage her RUE during more tasks at home. Pt. Has progressed with right UE strength,  grip strength, pinch strength, and [place] skills. Pt.'s FOTO score has progressed to 48.  Pt. worked on grasping 1" resistive cubes from a resistive board using lateral, and 3pt. Grasp patterns.  Pt. Had difficulty formulating 3pt. grasp patterns with the right hand, and isolating the 2nd digit in extension. Pt. continues to present with limited RUE motor control, and [facility] skills.  Pt. Continues to work on improving RUE strength, motor control, and [facility] skills in order improve RUE functioning, and maximize independence with ADLs, and IADLs         OT Occupational Profile and History Problem Focused Assessment - Including review of records relating to presenting problem    Occupational performance deficits (Please refer to evaluation for details): ADL's;IADL's;Work;Leisure    Body Structure / Function / Physical Skills ADL;GMC;ROM;UE functional  use;Balance;Endurance;FMC;Pain;Strength;Coordination;Dexterity;Gait;IADL;Sensation;Tone;Vision    Cognitive Skills Attention;Safety Awareness;Understand;Emotional;Temperament/Personality    Rehab Potential Good    Clinical Decision Making Several treatment options, min-mod task modification necessary    Comorbidities Affecting Occupational Performance: None    Modification or Assistance to Complete Evaluation  Min-Moderate modification of tasks or assist with assess necessary to complete eval    OT Frequency 2x / week    OT Duration 12 weeks    OT Treatment/Interventions Self-care/ADL training;Therapeutic exercise;Therapist, nutritional;Therapeutic activities;Visual/perceptual remediation/compensation;Balance training;Coping strategies training;Cryotherapy;Moist Heat;Neuromuscular education;DME and/or AE instruction;Manual Therapy;Passive range of motion;Patient/family education    OT Home Exercise Plan Initiated theraputty exercises for gripping and rolling putty with RUE    Consulted and Agree with Plan of Care Patient;Family member/caregiver    Family Member Consulted Mother, Vaughan Basta and daughter, Dondra Spry [place], [place], OTR/L   Harrel Carina, [place] 09/14/2021, 1:20 PM

## 2021-09-14 NOTE — Therapy (Signed)
OUTPATIENT PHYSICAL THERAPY NEURO TREATMENT NOTE   Patient Name: Cheyenne Martinez MRN: [id number] DOB:1975/07/26, 46 y.o., female Today's Date: 09/14/2021   PCP: Morene Crocker MD REFERRING PROVIDER: Morene Crocker MD   PT End of Session - 09/14/21 1706     Visit Number 7    Number of Visits 24    Date for PT Re-Evaluation 10/12/21    Authorization Type 1/10 eval 07/20/21    PT Start Time 1146    PT Stop Time 1230    PT Time Calculation (min) 44 min    Equipment Utilized During Treatment Gait belt    Activity Tolerance Patient tolerated treatment well    Behavior During Therapy [facility] for tasks assessed/performed                  History reviewed. No pertinent past medical history. History reviewed. No pertinent surgical history. Patient Active Problem List   Diagnosis Date Noted   CVA (cerebral vascular accident) (HCC) 07/01/2021   New onset type 2 diabetes mellitus (HCC) 07/01/2021   Dyslipidemia 07/01/2021   Hypertensive urgency 07/01/2021     ONSET DATE: 07/01/21  REFERRING DIAG: CVA  THERAPY DIAG:  Unsteadiness on feet  Muscle weakness (generalized)  Other lack of coordination  Rationale for Evaluation and Treatment Rehabilitation  SUBJECTIVE:                                                                                                                                                                                              SUBJECTIVE STATEMENT:  Pt reports no aches/pains currently. Pt has not had any stumbles/falls. Pt presents wearing R AFO. Reports helping with gait.    Pt accompanied by: family member, mother [name]  PERTINENT HISTORY: Patient is a 46 year old female who presented to ER with AMS on 07/01/21; MRI showed early subacute left PCA distribution infarct with ischemic changes within the left frontotemporal and right parietal lobe. Associated mild petechial blood products at the left occipital lobe without frank hemorrhagic  transformation or significant mass effect. 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     Patient also has new diagnosis of hypertension and diabetes. Per patient's evaluation with OT. Patient's daughter is now living with patient s/p stroke. Patient is very aphasic.  Patient has had one fall. Patient was working at [employer] and independent prior to stroke. Patient is walking without anything.   PAIN:  Are you having pain? No  PRECAUTIONS: Fall  WEIGHT BEARING RESTRICTIONS: No  PLOF: Independent  PATIENT GOALS: to get stronger and use RLE  OBJECTIVE:  The following measure were taken from previous session notes unless otherwise noted.  DIAGNOSTIC FINDINGS: MRI showed early subacute left PCA distribution infarct with ischemic changes within the left frontotemporal and right parietal lobe. Associated mild petechial blood products at the left occipital lobe without frank hemorrhagic transformation or significant mass effect. 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  COGNITION: Overall cognitive status: Impaired   SENSATION: Light touch: Impaired  on LLE  COORDINATION: Impaired on LLE, limited coordination and spatial awareness.    TODAY'S TREATMENT:  Gait belt donned and CGA provided unless otherwise noted:  Ambulation with QC through obstacle course: cones, half-foam 4x through. Unsteadies with obstacle clearance. Knocks over 2 cones.   Seated lateral step up and over half foam FWD/LTL 2x10 each LE. Frequent demo/VC provided.  Agility ladder step-length and LE sequencing intervention x multiple reps, improves technique with cuing  STS:  10x; 2 sets, continued cues for foot placement to promote LLE weightbearing, brief hold in standing position to work on standing balance with transfers and everyday tasks , and cuing for slower movement to improve  eccentric control    Alternating LAQ: 2 x 10x, 4# AW LE no weight RLE,  Seated march: 10x each, alternating LE, 4# AW donned   PATIENT EDUCATION: Education details: Pt educated throughout session about proper posture and technique with exercises. Improved exercise technique, movement at target joints, use of target muscles after min to mod verbal, visual, tactile cues.  Person educated: Patient and Caregiver Education method: Explanation, Demonstration, Tactile cues, and Verbal cues Education comprehension: returned demonstration, verbal cues required, tactile cues required, and needs further education   HOME EXERCISE PROGRAM: no updates on this date, pt to continue as previously given  08/18/21:  Access Code: [email or web address] URL: [email or web address] Date: 07/20/2021 Prepared by: Precious Bard  Exercises - Leg Extension  - 1 x daily - 7 x weekly - 2 sets - 10 reps - 5 hold - Seated March  - 1 x daily - 7 x weekly - 2 sets - 10 reps - 5 hold - Seated Hip Abduction  - 1 x daily - 7 x weekly - 2 sets - 10 reps - 5 hold - Seated Hip Adduction Isometrics with Ball  - 1 x daily - 7 x weekly - 2 sets - 10 reps - 5 hold    GOALS: Goals reviewed with patient? Yes  SHORT TERM GOALS: Target date: 08/17/2021  Patient will be independent in home exercise program to improve strength/mobility for better functional independence with ADLs. Baseline: 6/21: HEP given  Goal status: INITIAL  2.  Patient will deny any falls over past 4 weeks to demonstrate improved safety awareness at home and work.  Baseline: 6/21: 1 fall  Goal status: INITIAL  LONG TERM GOALS: Target date: 10/12/2021  Patient will increase FOTO score to equal to or greater than  68   to demonstrate statistically significant improvement in mobility and quality of life.  Baseline: 6/21: 49 Goal status: INITIAL  2.   Patient (< 63 years old) will complete five times sit to stand test in < 10 seconds indicating an  increased LE strength and improved balance Baseline: 6/21: 20 seconds with heavy LUE support Goal status: INITIAL  3.  Patient will increase 10 meter walk test to >1.10m/s as to improve gait speed for better community ambulation and to reduce fall risk. Baseline: 6/21 : 26 seconds with 3 LOB  Goal  status: INITIAL  4.  Patient will increase BLE gross strength to 4+/5 as to improve functional strength for independent gait, increased standing tolerance and increased ADL ability. Baseline: 6/21: see note Goal status: INITIAL   ASSESSMENT:  CLINICAL IMPRESSION: Pt with improved awareness of RLE positioning AEB only 2 instances of knocking over cones with obstacle course. Pt did struggle some with RLE foot clearance and balance with half-foam and agility ladder step-length exercise. She did improve technique with repetition. Pt will continue to benefit from skilled physical therapy intervention to address impairments, improve QOL, and attain therapy goals.   OBJECTIVE IMPAIRMENTS Abnormal gait, decreased activity tolerance, decreased balance, decreased cognition, decreased coordination, decreased endurance, decreased knowledge of condition, decreased knowledge of use of DME, decreased mobility, difficulty walking, decreased strength, decreased safety awareness, and increased fascial restrictions.   ACTIVITY LIMITATIONS carrying, lifting, bending, standing, squatting, stairs, transfers, hygiene/grooming, locomotion level, and caring for others  PARTICIPATION LIMITATIONS: cleaning, laundry, interpersonal relationship, driving, shopping, community activity, occupation, yard work, and church  PERSONAL FACTORS Age, Behavior pattern, Education, Past/current experiences, Social background, Time since onset of injury/illness/exacerbation, Transportation, and 3+ comorbidities: CVA, HTN, dyslipidemia, DM type II  are also affecting patient's functional outcome.   REHAB POTENTIAL: Fair    CLINICAL DECISION  MAKING: Evolving/moderate complexity  EVALUATION COMPLEXITY: Moderate  PLAN: PT FREQUENCY: 2x/week  PT DURATION: 12 weeks  PLANNED INTERVENTIONS: Therapeutic exercises, Therapeutic activity, Neuromuscular re-education, Balance training, Gait training, Patient/Family education, Joint mobilization, Stair training, Vestibular training, Canalith repositioning, Orthotic/Fit training, DME instructions, Cognitive remediation, Electrical stimulation, Cryotherapy, Moist heat, Splintting, Taping, Vasopneumatic device, Traction, Ultrasound, Biofeedback, and Manual therapy  PLAN FOR NEXT SESSION: strengthening, endurance, and gait interventions, continue plan   Baird Kay PT  09/14/2021, 5:11 PM

## 2021-09-19 ENCOUNTER — Ambulatory Visit: Payer: Medicaid Other

## 2021-09-19 ENCOUNTER — Encounter: Payer: Managed Care, Other (non HMO) | Admitting: Speech Pathology

## 2021-09-19 DIAGNOSIS — M6281 Muscle weakness (generalized): Secondary | ICD-10-CM | POA: Diagnosis not present

## 2021-09-19 DIAGNOSIS — R278 Other lack of coordination: Secondary | ICD-10-CM

## 2021-09-19 DIAGNOSIS — I63532 Cerebral infarction due to unspecified occlusion or stenosis of left posterior cerebral artery: Secondary | ICD-10-CM

## 2021-09-19 NOTE — Therapy (Signed)
Occupational Therapy TREATMENT NOTE     Patient Name: Cheyenne Martinez MRN: [id number] DOB:02/22/1975, 46 y.o., female Today's Date: 09/19/2021  REFERRING PROVIDER: Theora Master, MD      OT End of Session - 09/19/21 1503     Visit Number 11    Number of Visits 24    Date for OT Re-Evaluation 10/09/21    Authorization Time Period Reporting period beginning 09/14/21    OT Start Time 1409    OT Stop Time 1452    OT Time Calculation (min) 43 min    Activity Tolerance Patient tolerated treatment well    Behavior During Therapy [facility] for tasks assessed/performed                       No past medical history on file. No past surgical history on file. 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 African-American female with no chronic medical problems, who presented to the ER with acute onset of altered mental status and difficulty finding words with occasional slurred speech over the last couple of days.  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 Associated mild petechial blood products at the left occipital lobe without frank hemorrhagic transformation or significant mass effect. 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 Patient also has new diagnosis of hypertension and diabetes with A1c of 11.2.  Lipid profile with elevated cholesterol at 238,  triglyceride 159, HDL 39 and LDL 169 with goal less than 70.  PRECAUTIONS: None  SUBJECTIVE:  Pt arrived with mother.  Both report doing well today. PAIN:  Are you having pain? No  OBJECTIVE:   TODAY'S TREATMENT:  Therapeutic Exercise: Facilitated RUE strengthening with use of 1.5# dowel to perform bilat chest press, shoulder flex, ER behind head, and RUE shoulder abd.  OT provided min guard-min A to the RUE to control and maximize ROM on the RUE.  Pt performed 2 sets 10 reps.  With 2# dumbbell, pt performed elbow flex/ext, forearm pron/sup, and 1# weight for wrist flex/ext, OT providing intermittent min guard to help maintain form for 2 sets 10 reps each.  Pt completed R grip strengthening with use of hand gripper using 1 green band for resistance for 3 sets 10 reps.  Facilitated pinch strengthening with use of therapy resistant clothespins to target lateral, 2 point, and 3 point pinch of R hand, working with yellow and red pins on a vertical dowel; pt required mod vc for prehension patterns.  Neuro re-ed: Practiced grasp/release patterns moving clothespins from table top to container; pt required visual cue to keep from dragging hand over edge of container.   Targeted R hand dexterity skills working with [place] discs; pt practiced placing discs in grid, stacking 2 at a time, and flipping discs, with visual and vc to work with a 3 point pinch pattern.     PATIENT EDUCATION: Education details: Pt.caregiver education about  RUE ROM, and opportunities  for engagement of the RUE, and hand during ADL/IADL tasks. Person educated: Patient and Caregiver Education method: Explanation, Demonstration, and Verbal cues Education comprehension: verbalized understanding, returned demonstration, verbal cues required, and needs further education   HOME EXERCISE PROGRAM Pt. currently has a HEP for right hand strengthening with light blue theraputty. Pt./brother education about daily UE ROM.  Right Hand  Function:  Grip strength: 24# Lateral pinch strength: 10#   FMC with the 9 hole peg test : 2 min & 27 sec. To place 3 pegs., 43 sec. To remove 9 pegs.    OT Short Term Goals - 07/18/21 1444       OT SHORT TERM GOAL #1   Title Pt will perform RUE HEP for increasing strength and coordination with min vc.    Baseline 09/14/2021:  Pt. Is engaging her right hand during more tasks at home. Continue with ongoing pt./caregiver education. Eval: initiated at eval; requires mod A with gripping and rolling putty    Time 6    Period Weeks    Status ongoing   Target Date  10/26/2021       OT SHORT TERM GOAL #2   Title Family will provide min vc to increase attention to R side for pt to more easily navigate her environment.    Baseline 09/14/2021: Pt. Is now requiring fewer cues for right sided awareness when navigating through her environment. Eval: initiated education at eval as family was unaware of any visual decline (pt presents with mild R sided inattention and possible R field cut)    Time 6    Period Weeks    Status ongoing   Target Date 10/26/2021             OT Long Term Goals - 07/18/21 1447       OT LONG TERM GOAL #1   Title Pt will increase FOTO score to 45 or better to indicate perceived increased performance with daily tasks.    Baseline 09/14/2021: 48 Eval: FOTO 36   Time 12    Period Weeks    Status Achieved   Target Date 12/07/2021        OT LONG TERM GOAL #2   Title Pt will increase RUE MMT by 1 muscle grade for improved engagement of RUE into self care tasks.    Baseline 09/14/2021: RUE shoulder 3/5, elbow 4/5, wrist 4/5. Eval: Pt using L non-dominant arm for all daily tasks; R shoulder grossly 3-, elbow 4-, wrist 3+    Time 12    Period Weeks    Status ongoing   Target Date 12/07/2021     OT LONG TERM GOAL #3   Title Pt will increase R grip strength by 7# to enable pt. to hold and carry light ADL supplies in R dominant hand.    Baseline 09/14/2021: R: 24# Eval: R  0, L 72    Time 12    Period Weeks    Status ongoing   Target Date 12/07/2021     OT LONG TERM GOAL #4   Title Pt will write name with R dominant hand with 50% legibility    Baseline 09/14/2021: Using a built-up adaptive pen, the Pt. was able to formulate letters of her name with written text very close together, deviating above the line, and with 25% legibility.  Eval: Pt. can hold pen in R hand with OT assisting with set up, but pt can not make a mark on the paper  Time 12    Period Weeks    Status Ongoing   Target Date 12/07/2021     OT LONG TERM GOAL #5   Title Pt. will improve R hand dexterity for picking up pills with dominant hand.    Baseline 09/14/2021:  9 hole peg test: 2 min. & 27 sec. To place 3 pegs,43. Sec. To remove 9 pegs.  Eval: R 9 hole unable (uses L non-dominant hand to pick up pills)    Time 12    Period Weeks    Status Ongoing   Target Date 12/07/2021             Plan - 07/28/21 1149     Clinical Impression Statement Focussed today on RUE strengthening with pt tolerating 1.5# dowel for BUE ROM.  OT provided min guard-min A throughout exercises to maximize control and maximize end ROM.  Pt tolerated 2# dumbbell for elbow and forearm strengthening, and 1# for the wrist.  Pt worked on 2pt, 3pt, and lateral pinch patterns on the R hand working with resistive pins and [place] discs.  Pt was able to achieve 3 point pinch patterns with the clothespins <25% of the time, but was more successful with 3 point pinch when manipulating [place] discs (~75% accurate to achieve 3 point pinch pattern with discs).   Pt. continues to present with limited RUE motor control, and [facility] skills.  Pt. Continues to work on improving RUE strength, motor control, and [facility] skills in order improve RUE functioning, and maximize independence with ADLs, and IADLs    OT Occupational Profile and History Problem Focused Assessment - Including review of records relating to presenting problem     Occupational performance deficits (Please refer to evaluation for details): ADL's;IADL's;Work;Leisure    Body Structure / Function / Physical Skills ADL;GMC;ROM;UE functional use;Balance;Endurance;FMC;Pain;Strength;Coordination;Dexterity;Gait;IADL;Sensation;Tone;Vision    Cognitive Skills Attention;Safety Awareness;Understand;Emotional;Temperament/Personality    Rehab Potential Good    Clinical Decision Making Several treatment options, min-mod task modification necessary    Comorbidities Affecting Occupational Performance: None    Modification or Assistance to Complete Evaluation  Min-Moderate modification of tasks or assist with assess necessary to complete eval    OT Frequency 2x / week    OT Duration 12 weeks    OT Treatment/Interventions Self-care/ADL training;Therapeutic exercise;Building services engineer;Therapeutic activities;Visual/perceptual remediation/compensation;Balance training;Coping strategies training;Cryotherapy;Moist Heat;Neuromuscular education;DME and/or AE instruction;Manual Therapy;Passive range of motion;Patient/family education    OT Home Exercise Plan Initiated theraputty exercises for gripping and rolling putty with RUE    Consulted and Agree with Plan of Care Patient;Family member/caregiver    Family Member Consulted Mother, [name] and daughter, Karren Cobble, [place], OTR/L   Otis Dials, [place] 09/19/2021, 3:06 PM

## 2021-09-21 ENCOUNTER — Encounter: Payer: Managed Care, Other (non HMO) | Admitting: Speech Pathology

## 2021-09-21 NOTE — Therapy (Signed)
OUTPATIENT PHYSICAL THERAPY NEURO TREATMENT NOTE   Patient Name: Cheyenne Martinez Cheyenne Martinez MRN: [id number] DOB:October 27, 1975, 46 y.o., female Today's Date: 09/22/2021   PCP: Morene Crocker MD REFERRING PROVIDER: Morene Crocker MD   PT End of Session - 09/22/21 0940     Visit Number 8    Number of Visits 24    Date for PT Re-Evaluation 10/12/21    Authorization Type 1/10 eval 07/20/21    PT Start Time 0848    PT Stop Time 0930    PT Time Calculation (min) 42 min    Equipment Utilized During Treatment Gait belt    Activity Tolerance Patient tolerated treatment well    Behavior During Therapy [facility] for tasks assessed/performed                   History reviewed. No pertinent past medical history. History reviewed. No pertinent surgical history. Patient Active Problem List   Diagnosis Date Noted   CVA (cerebral vascular accident) (HCC) 07/01/2021   New onset type 2 diabetes mellitus (HCC) 07/01/2021   Dyslipidemia 07/01/2021   Hypertensive urgency 07/01/2021     ONSET DATE: 07/01/21  REFERRING DIAG: CVA  THERAPY DIAG:  Muscle weakness (generalized)  Unsteadiness on feet  Other lack of coordination  Difficulty in walking, not elsewhere classified  Rationale for Evaluation and Treatment Rehabilitation  SUBJECTIVE:                                                                                                                                                                                              SUBJECTIVE STATEMENT:  Pt reports no pain currently. Reports no falls/stumbles. Reports no other concerns.    Pt accompanied by: family member, mother [name]  PERTINENT HISTORY: Patient is a 46 year old female who presented to ER with AMS on 07/01/21; MRI showed early subacute left PCA distribution infarct with ischemic changes within the left frontotemporal and right parietal lobe. Associated mild petechial blood products at the left occipital lobe without frank hemorrhagic  transformation or significant mass effect. 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     Patient also has new diagnosis of hypertension and diabetes. Per patient's evaluation with OT. Patient's daughter is now living with patient s/p stroke. Patient is very aphasic.  Patient has had one fall. Patient was working at [employer] and independent prior to stroke. Patient is walking without anything.   PAIN:  Are you having pain? No  PRECAUTIONS: Fall  WEIGHT BEARING RESTRICTIONS: No  PLOF: Independent  PATIENT GOALS: to get stronger and use RLE  OBJECTIVE:  The following measure were taken from previous session notes unless otherwise noted.  DIAGNOSTIC FINDINGS: MRI showed early subacute left PCA distribution infarct with ischemic changes within the left frontotemporal and right parietal lobe. Associated mild petechial blood products at the left occipital lobe without frank hemorrhagic transformation or significant mass effect. 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  COGNITION: Overall cognitive status: Impaired   SENSATION: Light touch: Impaired  on LLE  COORDINATION: Impaired on LLE, limited coordination and spatial awareness.    TODAY'S TREATMENT:  Gait belt donned and CGA provided unless otherwise noted:  Nustep lvl 0-1 x 6 min. Performed to promote LE/UE mm and cardiorespiratory endurance. Significant improvement in ability to utilize RUE and RLE without assist greater than occ verbal cues for LE/UE positioning.   7 STS (2 performed hands-free) followed by ambulation with 2# AW 1x148 ft, HW. Fatiguing. PT provided instruction for LE positioning and technique with performing STS hand-free.  Ambulation with HW and QC through obstacle course: cones, and shoe box with 2# weights donned each LE 4x   5 reps STS without UE support. Continued instruction  in technique  Amb through cones and around clinic (approx 80 ft) with QC, CGA 1x still with 2# weights. Performed to promote endurance and scanning for obstacles, as well as dynamic balance, also to instruct in use/sequencing of QC.     PATIENT EDUCATION: Education details: Pt educated throughout session about proper posture and technique with exercises. Improved exercise technique, movement at target joints, use of target muscles after min to mod verbal, visual, tactile cues. Sequencing/use of QC. Person educated: Patient and Caregiver Education method: Explanation, Demonstration, Tactile cues, and Verbal cues Education comprehension: returned demonstration, verbal cues required, tactile cues required, and needs further education   HOME EXERCISE PROGRAM: no updates on this date, pt to continue as previously given  08/18/21:  Access Code: [email or web address] URL: [email or web address] Date: 07/20/2021 Prepared by: Precious Bard  Exercises - Leg Extension  - 1 x daily - 7 x weekly - 2 sets - 10 reps - 5 hold - Seated March  - 1 x daily - 7 x weekly - 2 sets - 10 reps - 5 hold - Seated Hip Abduction  - 1 x daily - 7 x weekly - 2 sets - 10 reps - 5 hold - Seated Hip Adduction Isometrics with Ball  - 1 x daily - 7 x weekly - 2 sets - 10 reps - 5 hold    GOALS: Goals reviewed with patient? Yes  SHORT TERM GOALS: Target date: 08/17/2021  Patient will be independent in home exercise program to improve strength/mobility for better functional independence with ADLs. Baseline: 6/21: HEP given  Goal status: INITIAL  2.  Patient will deny any falls over past 4 weeks to demonstrate improved safety awareness at home and work.  Baseline: 6/21: 1 fall  Goal status: INITIAL  LONG TERM GOALS: Target date: 10/12/2021  Patient will increase FOTO score to equal to or greater than  68   to demonstrate statistically significant improvement in mobility and quality of life.  Baseline: 6/21:  49 Goal status: INITIAL  2.   Patient (< 47 years old) will complete five times sit to stand test in < 10 seconds indicating an increased LE strength and improved balance Baseline: 6/21: 20 seconds with heavy LUE support Goal status: INITIAL  3.  Patient will increase 10 meter walk test to >1.43m/s as to  improve gait speed for better community ambulation and to reduce fall risk. Baseline: 6/21 : 26 seconds with 3 LOB  Goal status: INITIAL  4.  Patient will increase BLE gross strength to 4+/5 as to improve functional strength for independent gait, increased standing tolerance and increased ADL ability. Baseline: 6/21: see note Goal status: INITIAL   ASSESSMENT:  CLINICAL IMPRESSION: Pt shows progress today by utilizing RUE and RLE on nustep without assistance beyond occ verbal cuing for positioning. This indicates improved activation of R side and awareness of R side. Pt still has some difficulty with navigating around obstacles, but this improved with switch from [facility] to QC, where pt showed good balance with QC.  Pt will continue to benefit from skilled physical therapy intervention to address impairments, improve QOL, and attain therapy goals.   OBJECTIVE IMPAIRMENTS Abnormal gait, decreased activity tolerance, decreased balance, decreased cognition, decreased coordination, decreased endurance, decreased knowledge of condition, decreased knowledge of use of DME, decreased mobility, difficulty walking, decreased strength, decreased safety awareness, and increased fascial restrictions.   ACTIVITY LIMITATIONS carrying, lifting, bending, standing, squatting, stairs, transfers, hygiene/grooming, locomotion level, and caring for others  PARTICIPATION LIMITATIONS: cleaning, laundry, interpersonal relationship, driving, shopping, community activity, occupation, yard work, and church  PERSONAL FACTORS Age, Behavior pattern, Education, Past/current experiences, Social background, Time since onset of  injury/illness/exacerbation, Transportation, and 3+ comorbidities: CVA, HTN, dyslipidemia, DM type II  are also affecting patient's functional outcome.   REHAB POTENTIAL: Fair    CLINICAL DECISION MAKING: Evolving/moderate complexity  EVALUATION COMPLEXITY: Moderate  PLAN: PT FREQUENCY: 2x/week  PT DURATION: 12 weeks  PLANNED INTERVENTIONS: Therapeutic exercises, Therapeutic activity, Neuromuscular re-education, Balance training, Gait training, Patient/Family education, Joint mobilization, Stair training, Vestibular training, Canalith repositioning, Orthotic/Fit training, DME instructions, Cognitive remediation, Electrical stimulation, Cryotherapy, Moist heat, Splintting, Taping, Vasopneumatic device, Traction, Ultrasound, Biofeedback, and Manual therapy  PLAN FOR NEXT SESSION: strengthening, endurance, and gait interventions, continue plan   Baird Kay PT  09/22/2021, 9:42 AM

## 2021-09-22 ENCOUNTER — Ambulatory Visit: Payer: Medicaid Other

## 2021-09-22 ENCOUNTER — Encounter: Payer: Managed Care, Other (non HMO) | Admitting: Speech Pathology

## 2021-09-22 DIAGNOSIS — R262 Difficulty in walking, not elsewhere classified: Secondary | ICD-10-CM

## 2021-09-22 DIAGNOSIS — M6281 Muscle weakness (generalized): Secondary | ICD-10-CM

## 2021-09-22 DIAGNOSIS — R2681 Unsteadiness on feet: Secondary | ICD-10-CM

## 2021-09-22 DIAGNOSIS — R278 Other lack of coordination: Secondary | ICD-10-CM

## 2021-09-26 ENCOUNTER — Ambulatory Visit: Payer: Medicaid Other

## 2021-09-26 ENCOUNTER — Encounter: Payer: Managed Care, Other (non HMO) | Admitting: Speech Pathology

## 2021-09-26 DIAGNOSIS — R262 Difficulty in walking, not elsewhere classified: Secondary | ICD-10-CM

## 2021-09-26 DIAGNOSIS — M6281 Muscle weakness (generalized): Secondary | ICD-10-CM

## 2021-09-26 DIAGNOSIS — R278 Other lack of coordination: Secondary | ICD-10-CM

## 2021-09-26 DIAGNOSIS — R2681 Unsteadiness on feet: Secondary | ICD-10-CM

## 2021-09-26 DIAGNOSIS — R482 Apraxia: Secondary | ICD-10-CM

## 2021-09-26 NOTE — Therapy (Signed)
OUTPATIENT PHYSICAL THERAPY NEURO TREATMENT NOTE   Patient Name: Cheyenne Martinez MRN: [id number] DOB:1975/06/28, 46 y.o., female Today's Date: 09/26/2021   PCP: Morene Crocker MD REFERRING PROVIDER: Morene Crocker MD   PT End of Session - 09/26/21 1607     Visit Number 9    Number of Visits 24    Date for PT Re-Evaluation 10/12/21    Authorization Type 1/10 eval 07/20/21    PT Start Time 1519    PT Stop Time 1602    PT Time Calculation (min) 43 min    Equipment Utilized During Treatment Gait belt    Activity Tolerance Patient tolerated treatment well    Behavior During Therapy [facility] for tasks assessed/performed                    History reviewed. No pertinent past medical history. History reviewed. No pertinent surgical history. Patient Active Problem List   Diagnosis Date Noted   CVA (cerebral vascular accident) (HCC) 07/01/2021   New onset type 2 diabetes mellitus (HCC) 07/01/2021   Dyslipidemia 07/01/2021   Hypertensive urgency 07/01/2021     ONSET DATE: 07/01/21  REFERRING DIAG: CVA  THERAPY DIAG:  Other lack of coordination  Unsteadiness on feet  Difficulty in walking, not elsewhere classified  Muscle weakness (generalized)  Rationale for Evaluation and Treatment Rehabilitation  SUBJECTIVE:                                                                                                                                                                                              SUBJECTIVE STATEMENT:  Pt reports no pain currently. She has not had any stumbles or falls. She reports no other changes, concerns.   Pt accompanied by: family member, mother [name]  PERTINENT HISTORY: Patient is a 46 year old female who presented to ER with AMS on 07/01/21; MRI showed early subacute left PCA distribution infarct with ischemic changes within the left frontotemporal and right parietal lobe. Associated mild petechial blood products at the left occipital lobe  without frank hemorrhagic transformation or significant mass effect. 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     Patient also has new diagnosis of hypertension and diabetes. Per patient's evaluation with OT. Patient's daughter is now living with patient s/p stroke. Patient is very aphasic.  Patient has had one fall. Patient was working at [employer] and independent prior to stroke. Patient is walking without anything.   PAIN:  Are you having pain? No  PRECAUTIONS: Fall  WEIGHT BEARING RESTRICTIONS: No  PLOF: Independent  PATIENT  GOALS: to get stronger and use RLE   OBJECTIVE:  The following measure were taken from previous session notes unless otherwise noted.  DIAGNOSTIC FINDINGS: MRI showed early subacute left PCA distribution infarct with ischemic changes within the left frontotemporal and right parietal lobe. Associated mild petechial blood products at the left occipital lobe without frank hemorrhagic transformation or significant mass effect. 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  COGNITION: Overall cognitive status: Impaired   SENSATION: Light touch: Impaired  on LLE  COORDINATION: Impaired on LLE, limited coordination and spatial awareness.    TODAY'S TREATMENT:  Gait belt donned and CGA provided unless otherwise noted:  Nustep HIIT lvl 0-3 x 7 min, each level performed 1 minute intervals. Performed to promote LE/UE mm and cardiorespiratory endurance. Improved awareness of when to correct positioning of RUE. Good control RLE.  Ambulation with QC 1x148 ft, 1x40 ft for endurance and gait mechanics. Cuing throughout for correct sequencing with cane, LE. Pt with L foot scuff, cuing for heel strike L side.   Ambulation with QC through obstacle course: cones, and half-foam to step over x multiple reps. Increased difficulty by creating more  narrow formation of cones. Pt only knocks over cone 1x. Able to clear half-foam with cuing for correct sequencing of QC/LE.  At support surface with UUE support - practice heel-toe sequencing on L LE to improve heel strike. Some carryover with gait within session. Will likely need more instruction future sessions.   Provided form for orthotic signed by pt's physician to take to orthotist.    PATIENT EDUCATION: Education details: Pt educated throughout session about proper posture and technique with exercises. Improved exercise technique, movement at target joints, use of target muscles after min to mod verbal, visual, tactile cues. Continues cuing for sequencing/use of QC. Person educated: Patient and Caregiver Education method: Explanation, Demonstration, Tactile cues, and Verbal cues Education comprehension: returned demonstration, verbal cues required, tactile cues required, and needs further education   HOME EXERCISE PROGRAM: no updates on this date, pt to continue as previously given  08/18/21:  Access Code: [email or web address] URL: [email or web address] Date: 07/20/2021 Prepared by: Precious Bard  Exercises - Leg Extension  - 1 x daily - 7 x weekly - 2 sets - 10 reps - 5 hold - Seated March  - 1 x daily - 7 x weekly - 2 sets - 10 reps - 5 hold - Seated Hip Abduction  - 1 x daily - 7 x weekly - 2 sets - 10 reps - 5 hold - Seated Hip Adduction Isometrics with Ball  - 1 x daily - 7 x weekly - 2 sets - 10 reps - 5 hold    GOALS: Goals reviewed with patient? Yes  SHORT TERM GOALS: Target date: 08/17/2021  Patient will be independent in home exercise program to improve strength/mobility for better functional independence with ADLs. Baseline: 6/21: HEP given  Goal status: INITIAL  2.  Patient will deny any falls over past 4 weeks to demonstrate improved safety awareness at home and work.  Baseline: 6/21: 1 fall  Goal status: INITIAL  LONG TERM GOALS: Target date:  10/12/2021  Patient will increase FOTO score to equal to or greater than  68   to demonstrate statistically significant improvement in mobility and quality of life.  Baseline: 6/21: 49 Goal status: INITIAL  2.   Patient (< 67 years old) will complete five times sit to stand test in < 10 seconds indicating  an increased LE strength and improved balance Baseline: 6/21: 20 seconds with heavy LUE support Goal status: INITIAL  3.  Patient will increase 10 meter walk test to >1.59m/s as to improve gait speed for better community ambulation and to reduce fall risk. Baseline: 6/21 : 26 seconds with 3 LOB  Goal status: INITIAL  4.  Patient will increase BLE gross strength to 4+/5 as to improve functional strength for independent gait, increased standing tolerance and increased ADL ability. Baseline: 6/21: see note Goal status: INITIAL   ASSESSMENT:  CLINICAL IMPRESSION: Pt continues to show progress AEB improved scanning for obstacles, improved awareness of RUE/RLE with nustep and improved ability to clear obstacles. While pt shows progress, she still has difficulty with correct LE sequencing during gait. Following cues pt with modest improvements in gait mechanics within session. Pt will continue to benefit from skilled physical therapy intervention to address impairments, improve QOL, and attain therapy goals.   OBJECTIVE IMPAIRMENTS Abnormal gait, decreased activity tolerance, decreased balance, decreased cognition, decreased coordination, decreased endurance, decreased knowledge of condition, decreased knowledge of use of DME, decreased mobility, difficulty walking, decreased strength, decreased safety awareness, and increased fascial restrictions.   ACTIVITY LIMITATIONS carrying, lifting, bending, standing, squatting, stairs, transfers, hygiene/grooming, locomotion level, and caring for others  PARTICIPATION LIMITATIONS: cleaning, laundry, interpersonal relationship, driving, shopping, community  activity, occupation, yard work, and church  PERSONAL FACTORS Age, Behavior pattern, Education, Past/current experiences, Social background, Time since onset of injury/illness/exacerbation, Transportation, and 3+ comorbidities: CVA, HTN, dyslipidemia, DM type II  are also affecting patient's functional outcome.   REHAB POTENTIAL: Fair    CLINICAL DECISION MAKING: Evolving/moderate complexity  EVALUATION COMPLEXITY: Moderate  PLAN: PT FREQUENCY: 2x/week  PT DURATION: 12 weeks  PLANNED INTERVENTIONS: Therapeutic exercises, Therapeutic activity, Neuromuscular re-education, Balance training, Gait training, Patient/Family education, Joint mobilization, Stair training, Vestibular training, Canalith repositioning, Orthotic/Fit training, DME instructions, Cognitive remediation, Electrical stimulation, Cryotherapy, Moist heat, Splintting, Taping, Vasopneumatic device, Traction, Ultrasound, Biofeedback, and Manual therapy  PLAN FOR NEXT SESSION: strengthening, endurance, and gait interventions, continue plan   Baird Kay PT  09/26/2021, 4:17 PM

## 2021-09-27 NOTE — Therapy (Signed)
Occupational Therapy TREATMENT NOTE     Patient Name: Cheyenne Martinez MRN: [id number] DOB:1975/02/04, 46 y.o., female Today's Date: 09/27/2021  REFERRING PROVIDER: Gurney Maxin, MD      OT End of Session - 09/27/21 [phone]     Visit Number 12    Number of Visits 24    Date for OT Re-Evaluation 10/09/21    Authorization Time Period Reporting period beginning 09/14/21    OT Start Time 1400    OT Stop Time 1445    OT Time Calculation (min) 45 min    Activity Tolerance Patient tolerated treatment well    Behavior During Therapy [facility] for tasks assessed/performed                       No past medical history on file. No past surgical history on file. Patient Active Problem List   Diagnosis Date Noted   CVA (cerebral vascular accident) ([place]) 07/01/2021   New onset type 2 diabetes mellitus ([place]) 07/01/2021   Dyslipidemia 07/01/2021   Hypertensive urgency 07/01/2021      REFERRING DIAG: CVA  THERAPY DIAG:  Apraxia  Muscle weakness (generalized)  Other lack of coordination  Rationale for Evaluation and Treatment Rehabilitation  PERTINENT HISTORY: 46 y.o. African-American female with no chronic medical problems, who presented to the ER with acute onset of altered mental status and difficulty finding words with occasional slurred speech over the last couple of days.  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 Associated mild petechial blood products at the left occipital lobe without frank hemorrhagic transformation or significant mass effect. 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 Patient also has new diagnosis of hypertension and diabetes with A1c of 11.2.  Lipid profile with elevated cholesterol at 238, triglyceride 159, HDL 39 and LDL 169 with goal less than  70.  PRECAUTIONS: None  SUBJECTIVE:  Pt arrived with mother and brother.  Pt presents as tired today, seeming to require more encouragement for completion of exercises today, which OT provided. PAIN:  Are you having pain? No  OBJECTIVE:   TODAY'S TREATMENT:  Therapeutic Exercise: Facilitated RUE strengthening with use of 1.5# dowel to perform bilat chest press, shoulder flex, ER behind head.  OT provided min guard-min A to the RUE to control and maximize ROM on the RUE.  Pt performed 2 sets 10 reps.  With 2# dumbbell, pt performed elbow flex/ext, forearm pron/sup, and 1# weight for wrist flex/ext, OT providing intermittent min guard to help maintain form for 2 sets 10 reps each.  Pt completed R grip strengthening with use of hand gripper using 1 green band for resistance for 3 sets 10 reps.  Pt completed UBE x 2 min forward rotation x 2 min reverse rotation, working to increase BUE strength and activity tolerance for self care in the seated position; 1 rest break needed between forward and reverse.  Intermittent min A to maintain R hand grip on pedal.  Neuro re-ed: Practiced grasp/release patterns moving ball pegs from pegboard to target 2 and 3 point grasp patterns.  Attempted picking up ball pegs from table top and placing into pegboard, but not able to maintain grasp for placement.    PATIENT EDUCATION: Education details: Pt.caregiver education about  RUE ROM, and opportunities for engagement of the RUE, and hand during ADL/IADL tasks. Person  educated: Patient and Caregiver Education method: Explanation, Demonstration, and Verbal cues Education comprehension: verbalized understanding, returned demonstration, verbal cues required, and needs further education   HOME EXERCISE PROGRAM Pt. currently has a HEP for right hand strengthening with light blue theraputty. Pt./brother education about daily UE ROM.  Right Hand Function:  Grip strength: 24# Lateral pinch strength: 10#   FMC with  the 9 hole peg test : 2 min & 27 sec. To place 3 pegs., 43 sec. To remove 9 pegs.    OT Short Term Goals - 07/18/21 1444       OT SHORT TERM GOAL #1   Title Pt will perform RUE HEP for increasing strength and coordination with min vc.    Baseline 09/14/2021:  Pt. Is engaging her right hand during more tasks at home. Continue with ongoing pt./caregiver education. Eval: initiated at eval; requires mod A with gripping and rolling putty    Time 6    Period Weeks    Status ongoing   Target Date  10/26/2021       OT SHORT TERM GOAL #2   Title Family will provide min vc to increase attention to R side for pt to more easily navigate her environment.    Baseline 09/14/2021: Pt. Is now requiring fewer cues for right sided awareness when navigating through her environment. Eval: initiated education at eval as family was unaware of any visual decline (pt presents with mild R sided inattention and possible R field cut)    Time 6    Period Weeks    Status ongoing   Target Date 10/26/2021             OT Long Term Goals - 07/18/21 1447       OT LONG TERM GOAL #1   Title Pt will increase FOTO score to 45 or better to indicate perceived increased performance with daily tasks.    Baseline 09/14/2021: 48 Eval: FOTO 36   Time 12    Period Weeks    Status Achieved   Target Date 12/07/2021        OT LONG TERM GOAL #2   Title Pt will increase RUE MMT by 1 muscle grade for improved engagement of RUE into self care tasks.    Baseline 09/14/2021: RUE shoulder 3/5, elbow 4/5, wrist 4/5. Eval: Pt using L non-dominant arm for all daily tasks; R shoulder grossly 3-, elbow 4-, wrist 3+    Time 12    Period Weeks    Status ongoing   Target Date 12/07/2021     OT LONG TERM GOAL #3   Title Pt will increase R grip strength by 7# to enable pt. to hold and carry light ADL supplies in R dominant hand.    Baseline 09/14/2021: R: 24# Eval: R 0, L 72    Time 12    Period Weeks    Status ongoing   Target Date  12/07/2021     OT LONG TERM GOAL #4   Title Pt will write name with R dominant hand with 50% legibility    Baseline 09/14/2021: Using a built-up adaptive pen, the Pt. was able to formulate letters of her name with written text very close together, deviating above the line, and with 25% legibility.  Eval: Pt. can hold pen in R hand with OT assisting with set up, but pt can not make a mark on the paper    Time 12    Period Weeks    Status  Ongoing   Target Date 12/07/2021     OT LONG TERM GOAL #5   Title Pt. will improve R hand dexterity for picking up pills with dominant hand.    Baseline 09/14/2021:  9 hole peg test: 2 min. & 27 sec. To place 3 pegs,43. Sec. To remove 9 pegs.  Eval: R 9 hole unable (uses L non-dominant hand to pick up pills)    Time 12    Period Weeks    Status Ongoing   Target Date 12/07/2021             Plan - 07/28/21 1149     Clinical Impression Statement Pt presents as tired today, seeming to require more encouragement for completion of exercises today, which OT provided.  Frequent rest breaks needed between and during all activities.  Pt demonstrated improved ROM with resisted shoulder flexion today, requiring less assist from OT to achieve max range.  Pt worked with ball pegs, removing pegs from pegboard, but not yet able to place them into board.  Pt. continues to present with limited RUE motor control, and [facility] skills.  Pt. Continues to work on improving RUE strength, motor control, and [facility] skills in order improve RUE functioning, and maximize independence with ADLs, and IADLs    OT Occupational Profile and History Problem Focused Assessment - Including review of records relating to presenting problem    Occupational performance deficits (Please refer to evaluation for details): ADL's;IADL's;Work;Leisure    Body Structure / Function / Physical Skills ADL;GMC;ROM;UE functional  use;Balance;Endurance;FMC;Pain;Strength;Coordination;Dexterity;Gait;IADL;Sensation;Tone;Vision    Cognitive Skills Attention;Safety Awareness;Understand;Emotional;Temperament/Personality    Rehab Potential Good    Clinical Decision Making Several treatment options, min-mod task modification necessary    Comorbidities Affecting Occupational Performance: None    Modification or Assistance to Complete Evaluation  Min-Moderate modification of tasks or assist with assess necessary to complete eval    OT Frequency 2x / week    OT Duration 12 weeks    OT Treatment/Interventions Self-care/ADL training;Therapeutic exercise;Building services engineer;Therapeutic activities;Visual/perceptual remediation/compensation;Balance training;Coping strategies training;Cryotherapy;Moist Heat;Neuromuscular education;DME and/or AE instruction;Manual Therapy;Passive range of motion;Patient/family education    OT Home Exercise Plan Initiated theraputty exercises for gripping and rolling putty with RUE    Consulted and Agree with Plan of Care Patient;Family member/caregiver    Family Member Consulted Mother, [name] and daughter, Karren Cobble, [place], OTR/L   Otis Dials, [place] 09/27/2021, 8:23 AM

## 2021-09-28 ENCOUNTER — Ambulatory Visit: Payer: Medicaid Other

## 2021-09-28 ENCOUNTER — Encounter: Payer: Managed Care, Other (non HMO) | Admitting: Speech Pathology

## 2021-09-28 DIAGNOSIS — M6281 Muscle weakness (generalized): Secondary | ICD-10-CM

## 2021-09-28 DIAGNOSIS — R278 Other lack of coordination: Secondary | ICD-10-CM

## 2021-09-28 DIAGNOSIS — R2681 Unsteadiness on feet: Secondary | ICD-10-CM

## 2021-09-28 DIAGNOSIS — I63532 Cerebral infarction due to unspecified occlusion or stenosis of left posterior cerebral artery: Secondary | ICD-10-CM

## 2021-09-28 NOTE — Therapy (Signed)
OUTPATIENT PHYSICAL THERAPY NEURO TREATMENT NOTE/Physical Therapy Progress Note   Dates of reporting period  07/20/2021   to   09/28/2021    Patient Name: Cheyenne Martinez MRN: [id number] DOB:[birth year], 46 y.o., female Today's Date: 09/29/2021   PCP: Anabel Bene MD REFERRING PROVIDER: Anabel Bene MD   PT End of Session - 09/28/21 1114     Visit Number 10    Number of Visits 24    Date for PT Re-Evaluation 10/12/21    Authorization Type 1/10 eval 07/20/21    PT Start Time 1117    PT Stop Time 1159    PT Time Calculation (min) 42 min    Equipment Utilized During Treatment Gait belt    Activity Tolerance Patient tolerated treatment well    Behavior During Therapy [facility] for tasks assessed/performed                    History reviewed. No pertinent past medical history. History reviewed. No pertinent surgical history. Patient Active Problem List   Diagnosis Date Noted   CVA (cerebral vascular accident) ([place]) 07/01/2021   New onset type 2 diabetes mellitus ([place]) 07/01/2021   Dyslipidemia 07/01/2021   Hypertensive urgency 07/01/2021     ONSET DATE: 07/01/21  REFERRING DIAG: CVA  THERAPY DIAG:  Muscle weakness (generalized)  Other lack of coordination  Unsteadiness on feet  Rationale for Evaluation and Treatment Rehabilitation  SUBJECTIVE:                                                                                                                                                                                              SUBJECTIVE STATEMENT:  Pt reports no pain currently. No stumbles or falls. She reports no concerns currently.   Pt accompanied by: family member, mother Vaughan Basta  PERTINENT HISTORY: Patient is a 46 year old female who presented to ER with AMS on 07/01/21; MRI showed early subacute left PCA distribution infarct with ischemic changes within the left frontotemporal and right parietal lobe. Associated mild petechial blood products at the  left occipital lobe without frank hemorrhagic transformation or significant mass effect. 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     Patient also has new diagnosis of hypertension and diabetes. Per patient's evaluation with OT. Patient's daughter is now living with patient s/p stroke. Patient is very aphasic.  Patient has had one fall. Patient was working at [employer] and independent prior to stroke. Patient is walking without anything.   PAIN:  Are you having pain? No  PRECAUTIONS: Fall  WEIGHT BEARING RESTRICTIONS:  No  PLOF: Independent  PATIENT GOALS: to get stronger and use RLE   OBJECTIVE:  The following measure were taken from previous session notes unless otherwise noted.  DIAGNOSTIC FINDINGS: MRI showed early subacute left PCA distribution infarct with ischemic changes within the left frontotemporal and right parietal lobe. Associated mild petechial blood products at the left occipital lobe without frank hemorrhagic transformation or significant mass effect. 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  COGNITION: Overall cognitive status: Impaired   SENSATION: Light touch: Impaired  on LLE  COORDINATION: Impaired on LLE, limited coordination and spatial awareness.    TODAY'S TREATMENT:  Gait belt donned and CGA provided unless otherwise noted: TherAct: Goals reassessed for progress note. See goal section for details. PT instructed pt throughout in goal technique, and provided education to pt and her mother on indications of goal performance.   Other interventions  TherEx: 2# weights donned: Seated march 12x2 sets, 1x6 each LE  LAQ 2x10 each LE Seated adductor squeeze with p.ball 10x 3 sec holds BLE YTB hip abduction/ER in sitting 2x20 B  STS 3x5   Standing DF/PF LLE only 2x10 for each movement   PATIENT EDUCATION: Education  details: goals, indications, exercise technique Person educated: Patient and Caregiver Education method: Explanation, Demonstration, Tactile cues, and Verbal cues Education comprehension: returned demonstration, verbal cues required, tactile cues required, and needs further education   HOME EXERCISE PROGRAM: no updates on this date, pt to continue as previously given  08/18/21:  Access Code: [email or web address] URL: [email or web address] Date: 07/20/2021 Prepared by: Janna Arch  Exercises - Leg Extension  - 1 x daily - 7 x weekly - 2 sets - 10 reps - 5 hold - Seated March  - 1 x daily - 7 x weekly - 2 sets - 10 reps - 5 hold - Seated Hip Abduction  - 1 x daily - 7 x weekly - 2 sets - 10 reps - 5 hold - Seated Hip Adduction Isometrics with Ball  - 1 x daily - 7 x weekly - 2 sets - 10 reps - 5 hold    GOALS: Goals reviewed with patient? Yes  SHORT TERM GOALS: Target date: 08/17/2021  Patient will be independent in home exercise program to improve strength/mobility for better functional independence with ADLs. Baseline: 6/21: HEP given; 8/30: reports going well, to be advanced future date Goal status: IN PROGRESS  2.  Patient will deny any falls over past 4 weeks to demonstrate improved safety awareness at home and work.  Baseline: 6/21: 1 fall; 8/30: no falls in the last 4 weeks Goal status: MET  LONG TERM GOALS: Target date: 10/12/2021  Patient will increase FOTO score to equal to or greater than  68   to demonstrate statistically significant improvement in mobility and quality of life.  Baseline: 6/21: 49; 8/30: 65 Goal status: Partially MET   2.   Patient (< 97 years old) will complete five times sit to stand test in < 10 seconds indicating an increased LE strength and improved balance Baseline: 6/21: 20 seconds with heavy LUE support; 8/30: 20 sec with LUE support, not heavy weightbearing through UE Goal status: INITIAL  3.  Patient will increase 10 meter walk test to  >1.58m/s as to improve gait speed for better community ambulation and to reduce fall risk. Baseline: 6/21 : 26 seconds with 3 LOB (0.38 m/s) ; 8/30:0.22 m/s with QC, no LOB Goal status: INITIAL  4.  Patient  will increase BLE gross strength to 4+/5 as to improve functional strength for independent gait, increased standing tolerance and increased ADL ability. Baseline: 6/21: see note; 8/30: 4+/5 LLE, 4-/5 RLE Goal status: INITIAL   ASSESSMENT:  CLINICAL IMPRESSION: Goals reassessed for progress note. Pt now completing goals using QC instead of HW, affecting scores, indicating pt with improved balance. Pt has met one goal (no recent falls) and has partially met FOTO goal. Pt with slight decrease on 10MWT, however, this is with more advanced AD compared to prior assessment, and she exhibited no LOB. 5xSTS score was the same as last assessment, but pt exhibits less reliance on weightbearing through UE to complete reps. The pt will continue to benefit from skilled physical therapy intervention to address impairments, improve QOL, and attain therapy goals.   OBJECTIVE IMPAIRMENTS Abnormal gait, decreased activity tolerance, decreased balance, decreased cognition, decreased coordination, decreased endurance, decreased knowledge of condition, decreased knowledge of use of DME, decreased mobility, difficulty walking, decreased strength, decreased safety awareness, and increased fascial restrictions.   ACTIVITY LIMITATIONS carrying, lifting, bending, standing, squatting, stairs, transfers, hygiene/grooming, locomotion level, and caring for others  PARTICIPATION LIMITATIONS: cleaning, laundry, interpersonal relationship, driving, shopping, community activity, occupation, yard work, and church  PERSONAL FACTORS Age, Behavior pattern, Education, Past/current experiences, Social background, Time since onset of injury/illness/exacerbation, Transportation, and 3+ comorbidities: CVA, HTN, dyslipidemia, DM type II   are also affecting patient's functional outcome.   REHAB POTENTIAL: Fair    CLINICAL DECISION MAKING: Evolving/moderate complexity  EVALUATION COMPLEXITY: Moderate  PLAN: PT FREQUENCY: 2x/week  PT DURATION: 12 weeks  PLANNED INTERVENTIONS: Therapeutic exercises, Therapeutic activity, Neuromuscular re-education, Balance training, Gait training, Patient/Family education, Joint mobilization, Stair training, Vestibular training, Canalith repositioning, Orthotic/Fit training, DME instructions, Cognitive remediation, Electrical stimulation, Cryotherapy, Moist heat, Splintting, Taping, Vasopneumatic device, Traction, Ultrasound, Biofeedback, and Manual therapy  PLAN FOR NEXT SESSION: strengthening, endurance, and gait interventions, continue plan   Zollie Pee PT  09/29/2021, 9:15 AM

## 2021-09-28 NOTE — Therapy (Addendum)
Occupational Therapy TREATMENT NOTE     Patient Name: Cheyenne Martinez MRN: [id number] DOB:02-23-1975, 46 y.o., female Today's Date: 09/28/2021  REFERRING PROVIDER: Theora Master, MD      OT End of Session - 09/28/21 1755     Visit Number 13    Number of Visits 24    Date for OT Re-Evaluation 10/09/21    Authorization Time Period Reporting period beginning 09/14/21    OT Start Time 1019    OT Stop Time 1104    OT Time Calculation (min) 45 min    Activity Tolerance Patient tolerated treatment well    Behavior During Therapy [facility] for tasks assessed/performed                       No past medical history on file. No past surgical history on file. 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 African-American female with no chronic medical problems, who presented to the ER with acute onset of altered mental status and difficulty finding words with occasional slurred speech over the last couple of days.  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 Associated mild petechial blood products at the left occipital lobe without frank hemorrhagic transformation or significant mass effect. 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 Patient also has new diagnosis of hypertension and diabetes with A1c of 11.2.  Lipid profile with elevated cholesterol at 238,  triglyceride 159, HDL 39 and LDL 169 with goal less than 70.  PRECAUTIONS: None  SUBJECTIVE:  Pt arrived with mother today, in good spirits.   PAIN:  Are you having pain? No  OBJECTIVE:   TODAY'S TREATMENT:  Therapeutic Exercise: Facilitated RUE strengthening with completion of active shoulder flex, active assisted abduction, and active functional reaching patterns including R hand to back of hand, behind back, and opposite shoulder to simulate UB ADLs.  Completed a 2nd set of 10 reps with 1# wrist weight donned and OT providing min A past 90 degrees flexion and 45 degrees abd.  Pt was able to achieve full range with weight when touching opposite shoulder, touching behind hand and back.  Pt completed R grip strengthening with use of 7# digi-flex; cues for technique to achieve increased flexion of digits.  Pt able to squeeze digiflex ~50% of full flexion at 7#.    Neuro re-ed: Used R hand for resistive functional grasping patterns with EZ board manipulatives.  Pt completed 1 set each for dowel which simulated R wrist flex/ext and pron/sup, turning a key, picking up wooden block, turning a knob, rolling larger dowel with palm and fingertips.  Pt required initial demonstration for each tool, and min guard-min A to keep manipulatives level and straight on board.  Pt practiced R hand dexterity skills working to pick up small glass stones from [place] dish and then progressed to picking up from table top with flat side up.  Pt struggled without a non-skid surface so  transitioned to picking up stones from a washcloth.  Pt was able to pick up 1 stone at a time with ~75% accuracy from wooden dish and discard stone at the end of the board.  Pt then was able to practice scooping several into her hand, picking up to 7 stones at once before discarding her handful into dish.   PATIENT EDUCATION: Education details: Pt.caregiver education about  RUE ROM, and opportunities for engagement of the RUE, and hand during  ADL/IADL tasks. Person educated: Patient and Caregiver Education method: Explanation, Demonstration, and Verbal cues Education comprehension: verbalized understanding, returned demonstration, verbal cues required, and needs further education   HOME EXERCISE PROGRAM Pt. currently has a HEP for right hand strengthening with light blue theraputty. Pt./brother education about daily UE ROM.  Right Hand Function:  Grip strength: 24# Lateral pinch strength: 10#   FMC with the 9 hole peg test : 2 min & 27 sec. To place 3 pegs., 43 sec. To remove 9 pegs.    OT Short Term Goals - 07/18/21 1444       OT SHORT TERM GOAL #1   Title Pt will perform RUE HEP for increasing strength and coordination with min vc.    Baseline 09/14/2021:  Pt. Is engaging her right hand during more tasks at home. Continue with ongoing pt./caregiver education. Eval: initiated at eval; requires mod A with gripping and rolling putty    Time 6    Period Weeks    Status ongoing   Target Date  10/26/2021       OT SHORT TERM GOAL #2   Title Family will provide min vc to increase attention to R side for pt to more easily navigate her environment.    Baseline 09/14/2021: Pt. Is now requiring fewer cues for right sided awareness when navigating through her environment. Eval: initiated education at eval as family was unaware of any visual decline (pt presents with mild R sided inattention and possible R field cut)    Time 6    Period Weeks    Status ongoing   Target Date 10/26/2021             OT Long Term Goals - 07/18/21 1447       OT LONG TERM GOAL #1   Title Pt will increase FOTO score to 45 or better to indicate perceived increased performance with daily tasks.    Baseline 09/14/2021: 48 Eval: FOTO 36   Time 12    Period Weeks    Status Achieved   Target Date 12/07/2021        OT LONG TERM GOAL #2   Title Pt will increase RUE MMT by 1 muscle grade for improved engagement of RUE into self care tasks.     Baseline 09/14/2021: RUE shoulder 3/5, elbow 4/5, wrist 4/5. Eval: Pt using L non-dominant arm for all daily tasks; R shoulder grossly 3-, elbow 4-, wrist 3+    Time 12    Period Weeks    Status ongoing   Target Date 12/07/2021     OT LONG TERM GOAL #3   Title Pt will increase R grip strength by 7# to enable pt. to hold and carry light ADL supplies in R dominant hand.    Baseline 09/14/2021: R: 24# Eval: R 0, L 72    Time 12    Period Weeks    Status ongoing   Target Date 12/07/2021     OT LONG TERM GOAL #4  Title Pt will write name with R dominant hand with 50% legibility    Baseline 09/14/2021: Using a built-up adaptive pen, the Pt. was able to formulate letters of her name with written text very close together, deviating above the line, and with 25% legibility.  Eval: Pt. can hold pen in R hand with OT assisting with set up, but pt can not make a mark on the paper    Time 12    Period Weeks    Status Ongoing   Target Date 12/07/2021     OT LONG TERM GOAL #5   Title Pt. will improve R hand dexterity for picking up pills with dominant hand.    Baseline 09/14/2021:  9 hole peg test: 2 min. & 27 sec. To place 3 pegs,43. Sec. To remove 9 pegs.  Eval: R 9 hole unable (uses L non-dominant hand to pick up pills)    Time 12    Period Weeks    Status Ongoing   Target Date 12/07/2021             Plan - 07/28/21 1149     Clinical Impression Statement Pt with good participation today, more focussed on her tasks with less frustration and fatigue.  Pt presented with fairly good control with functional reaching patterns for the R shoulder; added 1# wrist weight to all shoulder planes which required min A to achieve max range and form.  Pt required min guard-min A with EZ board tools and OT adapted activity for pt to use smaller tools with less resistance on the narrow velcro when pt showed signs of fatigue.  Pt making steady gains with strength and coordination throughout RUE.  Pt. continues to  work on improving RUE strength, motor control, and [facility] skills in order improve RUE functioning, and maximize independence with ADLs, and IADLs    OT Occupational Profile and History Problem Focused Assessment - Including review of records relating to presenting problem    Occupational performance deficits (Please refer to evaluation for details): ADL's;IADL's;Work;Leisure    Body Structure / Function / Physical Skills ADL;GMC;ROM;UE functional use;Balance;Endurance;FMC;Pain;Strength;Coordination;Dexterity;Gait;IADL;Sensation;Tone;Vision    Cognitive Skills Attention;Safety Awareness;Understand;Emotional;Temperament/Personality    Rehab Potential Good    Clinical Decision Making Several treatment options, min-mod task modification necessary    Comorbidities Affecting Occupational Performance: None    Modification or Assistance to Complete Evaluation  Min-Moderate modification of tasks or assist with assess necessary to complete eval    OT Frequency 2x / week    OT Duration 12 weeks    OT Treatment/Interventions Self-care/ADL training;Therapeutic exercise;Building services engineer;Therapeutic activities;Visual/perceptual remediation/compensation;Balance training;Coping strategies training;Cryotherapy;Moist Heat;Neuromuscular education;DME and/or AE instruction;Manual Therapy;Passive range of motion;Patient/family education    OT Home Exercise Plan Initiated theraputty exercises for gripping and rolling putty with RUE    Consulted and Agree with Plan of Care Patient;Family member/caregiver    Family Member Consulted Mother, [name] and daughter, Karren Cobble, [place], OTR/L   Otis Dials, [place] 09/28/2021, 5:57 PM

## 2021-10-04 ENCOUNTER — Ambulatory Visit: Payer: Medicaid Other | Admitting: Physical Therapy

## 2021-10-04 ENCOUNTER — Ambulatory Visit: Payer: Medicaid Other

## 2021-10-04 ENCOUNTER — Ambulatory Visit: Payer: Medicaid Other | Admitting: Speech Pathology

## 2021-10-06 ENCOUNTER — Encounter: Payer: Managed Care, Other (non HMO) | Admitting: Occupational Therapy

## 2021-10-06 ENCOUNTER — Encounter: Payer: Managed Care, Other (non HMO) | Admitting: Speech Pathology

## 2021-10-06 ENCOUNTER — Ambulatory Visit: Payer: Medicaid Other | Attending: Neurology | Admitting: Speech Pathology

## 2021-10-06 ENCOUNTER — Ambulatory Visit: Payer: Managed Care, Other (non HMO)

## 2021-10-06 DIAGNOSIS — R4701 Aphasia: Secondary | ICD-10-CM

## 2021-10-06 DIAGNOSIS — I63532 Cerebral infarction due to unspecified occlusion or stenosis of left posterior cerebral artery: Secondary | ICD-10-CM | POA: Diagnosis present

## 2021-10-08 NOTE — Therapy (Signed)
OUTPATIENT SPEECH LANGUAGE PATHOLOGY TREATMENT NOTE   Patient Name: Cheyenne Martinez MRN: [id number] DOB:11/04/1975, 46 y.o., female Today's Date: 10/08/2021  PCP: No PCP REFERRING PROVIDER: Theora Master, MD   End of Session - 10/08/21 1917     Visit Number 12    Number of Visits 25    Date for SLP Re-Evaluation 10/13/21    Authorization Type [place] Medicaid [employer]aid [employer]    Authorization Time Period 08/01/2021 thru 10/31/202    Authorization - Visit Number 9    Authorization - Number of Visits 16    Progress Note Due on Visit 10    SLP Start Time 1110    SLP Stop Time  1200    SLP Time Calculation (min) 50 min    Activity Tolerance Patient tolerated treatment well             No past medical history on file. No past surgical history on file. 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 Evolving early subacute left PCA distribution infarct as above,  with additional scattered small volume ischemic changes within the  left frontotemporal and right parietal lobe as above. Associated  mild petechial blood products at the left occipital lobe without  frank hemorrhagic transformation or significant mass effect.  2. Underlying mild chronic  microvascular ischemic disease with  chronic right cerebellar infarct.    07/04/2021 CTA HEAD NECK W WO CONTRAST  IMPRESSION:  Evolving recent infarcts as seen on recent MRI. There is likely mild  petechial hemorrhage associated with the left parieto-occipital  infarct. No significant mass effect.  Plaque at the left greater than right ICA origins without  hemodynamically significant stenosis. Marked stenosis at the right  vertebral origin without apparent flow limitation.  Intracranial atherosclerosis involving anterior and posterior  circulations. No proximal intracranial vessel occlusion.    TODAY'S TREATMENT:  Skilled treatment session focused on pt's expressive and receptive language abilities. SLP facilitated session by providing the following interventions:  SLP facilitated the session by using the TalkPath Therapy App.  Auditory Word ID: level 1: 70% Matching letters: Level 1: 100%; Level 2 incomplete as pt appeared to disengage in task   PATIENT EDUCATION: Education details: consistent carryover of concepts within session Person educated: patient and her mother Education method: Explanation, Demonstration, and Verbal cues Education comprehension: needs further education   GOALS: Goals reviewed with patient? YES  SHORT TERM GOALS: Target date: 10 sessions  Given maximal multimodal cues, pt will increase speech approximation by repeating rote conversational words with 50% intelligibility. Baseline: unintelligible despite maximal cues Goal status: 25%; ongoing  2.  Pt will use a SGD to answer simple biographical questions at 50% accuracy given frequent maximal verbal and maximal visual cues. Baseline: no accuracy Goal status: discontinued, pt uninterested in using SGD  3.  Pt will repeat single words such as family members' names in 50% of opportunities given frequent maximal verbal and maximal visual cues. Baseline: previous goal discontinued as pt didn't desire  to use SGD Goal status: INITIAL (08/22/2021)   LONG TERM GOALS: Target date: 10/15/2021    Using multimodal communication, pt will express basic wants and needs in 8 out of 10 opportunities.  Baseline: unable Goal status: INITIAL  2.  Using multimodal communication, pt will answer basic questions in 8 out of 10 opportunities.  Baseline: unable Goal status: INITIAL  ASSESSMENT:  CLINICAL IMPRESSION: During conversation with pt and her mother, pt, using gestures, answering yes/no questions and verbal communication, indicated that she was not interested in continuing ST services. Encouragement and support provided, but pt continued to indicate that she didn't wish to continue services. She was agreeable to being on hold for 4 weeks to allow for additional time to cope with her stroke and it deficits.   OBJECTIVE IMPAIRMENTS include expressive language, receptive language, aphasia, and dysarthria. These impairments are limiting patient from return to work, managing medications, managing appointments, managing finances, household responsibilities, ADLs/IADLs, and effectively communicating at home and in community. Factors affecting potential to achieve goals and functional outcome are severity of impairments. Patient will benefit from skilled SLP services to address above impairments and improve overall function.  REHAB POTENTIAL: Excellent  PLAN: SLP FREQUENCY: 2x/week - pt placed on hold for 4 weeks  SLP DURATION: 12 weeks  PLANNED INTERVENTIONS: Language facilitation, Cueing hierachy, Internal/external aids, Functional tasks, Multimodal communication approach, and SLP instruction and feedback   Sherwin Hollingshed B. Dreama Saa, M.S., CCC-SLP, Tree surgeon Certified Brain Injury Specialist [facility]  [facility] Rehabilitation Services Office [phone] Ascom [phone] Fax [phone]

## 2021-10-10 ENCOUNTER — Ambulatory Visit: Payer: Medicaid Other | Admitting: Occupational Therapy

## 2021-10-10 ENCOUNTER — Ambulatory Visit: Payer: Managed Care, Other (non HMO)

## 2021-10-10 ENCOUNTER — Ambulatory Visit: Payer: Medicaid Other | Attending: Neurology | Admitting: Speech Pathology

## 2021-10-10 DIAGNOSIS — R482 Apraxia: Secondary | ICD-10-CM | POA: Insufficient documentation

## 2021-10-10 DIAGNOSIS — R4701 Aphasia: Secondary | ICD-10-CM | POA: Diagnosis present

## 2021-10-10 DIAGNOSIS — R262 Difficulty in walking, not elsewhere classified: Secondary | ICD-10-CM | POA: Diagnosis not present

## 2021-10-10 DIAGNOSIS — R278 Other lack of coordination: Secondary | ICD-10-CM

## 2021-10-10 DIAGNOSIS — M6281 Muscle weakness (generalized): Secondary | ICD-10-CM | POA: Insufficient documentation

## 2021-10-10 DIAGNOSIS — R2681 Unsteadiness on feet: Secondary | ICD-10-CM | POA: Diagnosis present

## 2021-10-10 DIAGNOSIS — I63532 Cerebral infarction due to unspecified occlusion or stenosis of left posterior cerebral artery: Secondary | ICD-10-CM | POA: Diagnosis present

## 2021-10-10 NOTE — Addendum Note (Signed)
Addended by: Phyllis Ginger on: 10/10/2021 08:49 PM   Modules accepted: Orders

## 2021-10-10 NOTE — Therapy (Addendum)
OUTPATIENT SPEECH LANGUAGE PATHOLOGY TREATMENT NOTE   Patient Name: Cheyenne Martinez MRN: [id number] DOB:Mar 15, 1975, 46 y.o., female Today's Date: 10/10/2021  PCP: No PCP REFERRING PROVIDER: Gurney Maxin, MD   End of Session - 10/08/21 1917     Visit Number 13   Number of Visits 25    Date for SLP Re-Evaluation 10/19/20203   Authorization Type [employer]   Authorization Time Period 08/01/2021 thru 10/31/202    Authorization - Visit Number 10   Authorization - Number of Visits 16    Progress Note Due on Visit 20   SLP Start Time 1050   SLP Stop Time  1130   SLP Time Calculation (min) 40 min    Activity Tolerance Patient tolerated treatment well             No past medical history on file. No past surgical history on file. 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 Evolving early subacute left PCA distribution infarct as above,  with additional scattered small volume ischemic changes within the  left frontotemporal and right parietal lobe as above. Associated  mild petechial blood products at the left occipital lobe without  frank hemorrhagic transformation or significant mass effect.  2. Underlying mild chronic microvascular ischemic disease with  chronic right cerebellar infarct.     07/04/2021 CTA HEAD NECK W WO CONTRAST  IMPRESSION:  Evolving recent infarcts as seen on recent MRI. There is likely mild  petechial hemorrhage associated with the left parieto-occipital  infarct. No significant mass effect.  Plaque at the left greater than right ICA origins without  hemodynamically significant stenosis. Marked stenosis at the right  vertebral origin without apparent flow limitation.  Intracranial atherosclerosis involving anterior and posterior  circulations. No proximal intracranial vessel occlusion.    TODAY'S TREATMENT:    Skilled treatment session focused on pt's language goals. SLP facilitated the session by providing the following interventions:    Reviewed benefits of SGD  Made a written list of functional phrases to practice pertaining to her sons' first day at school Pt with moderate ability to imitate each phrase. When model was faded, pt with decreased ability to read basic words and she was unaware of deficits. Pt indicated that she would practice at home but her accuracy is likely very reduced without a recorded model. Unfortunately, pt declined SLP recording of model.     PATIENT EDUCATION: Education details: functional verbal communication is not a reasonable goal for the foreseeable future; benefits of using a SGD Person educated: patient and her brother Education method: Explanation, Demonstration, and Verbal cues Education comprehension: needs further education   GOALS: Goals reviewed with patient? YES  SHORT TERM GOALS: Target date: 10 sessions  Given maximal multimodal cues, pt will increase speech approximation by repeating rote conversational phrases with 75% intelligibility. Baseline: unintelligible despite maximal cues 10/06/2021  Goal status: 50%; ongoing  2.  Pt will use a SGD to answer simple biographical questions at 50% accuracy given frequent maximal verbal and maximal visual cues. Baseline: no accuracy 10/06/2021 Goal  status: discontinued, pt uninterested in using SGD  3.  Given maximal multimodal cues, pt will follow 1-step directions with 50% accuracy.  Baseline: previous goal discontinued; 0% accuracy 10/06/2021 Goal status: INITIAL   LONG TERM GOALS: Target date: 11/17/2021  Using multimodal communication, pt will express basic wants and needs in 8 out of 10 opportunities.  Baseline: unable Goal status: NOT MET  2.  Using multimodal communication, pt will answer basic questions in 8 out of 10 opportunities.  Baseline: unable Goal status: NOT MET  3. The patient will name 5 common household objects at 50% accuracy given frequent maximum verbal and frequent maximum phonemic cues.  Baseline: unable  Goal status: INITIAL  4. Given maximal multimodal cues, pt will follow 1-step directions with 60% accuracy.  Baseline: unable  Goal status: INITIAL   ASSESSMENT:  CLINICAL IMPRESSION: Prognosis for improvement is guarded to poor given severity of deficits and lack of health literacy. SLP provided education and demonstration of clinic's SGD as well as prognosis for functional verbal communication. Pt again politely stated "no" and gestured that she was not interested in trailing an SGD, she also indicated disinterest in using pictures to supplement verbal communication with others. When repeating list of personally functional phrases, SLP offered to create a recording of each phrase for pt to listen too on pt's cell phone. Pt gestured no.    OBJECTIVE IMPAIRMENTS include expressive language, receptive language, aphasia, and dysarthria. These impairments are limiting patient from return to work, managing medications, managing appointments, managing finances, household responsibilities, ADLs/IADLs, and effectively communicating at home and in community. Factors affecting potential to achieve goals and functional outcome are severity of impairments. Patient will benefit from skilled SLP services to  address above impairments and improve overall function.  REHAB POTENTIAL: Guarded; poor health literacy, severity of deficits, self-limiting behaviors  PLAN: SLP FREQUENCY: 1xweek  SLP DURATION: 12 weeks  PLANNED INTERVENTIONS: Language facilitation, Cueing hierachy, Internal/external aids, Functional tasks, Multimodal communication approach, and SLP instruction and feedback   Kerly Rigsbee B. Rutherford Nail, M.S., CCC-SLP, Mining engineer Certified Brain Injury [place]  [place] Office [phone] Ascom [phone] Fax [phone]

## 2021-10-12 ENCOUNTER — Encounter: Payer: Managed Care, Other (non HMO) | Admitting: Speech Pathology

## 2021-10-12 ENCOUNTER — Ambulatory Visit: Payer: Managed Care, Other (non HMO)

## 2021-10-13 ENCOUNTER — Ambulatory Visit: Payer: Medicaid Other

## 2021-10-13 DIAGNOSIS — R2681 Unsteadiness on feet: Secondary | ICD-10-CM

## 2021-10-13 DIAGNOSIS — M6281 Muscle weakness (generalized): Secondary | ICD-10-CM

## 2021-10-13 DIAGNOSIS — R4701 Aphasia: Secondary | ICD-10-CM | POA: Diagnosis not present

## 2021-10-13 DIAGNOSIS — R262 Difficulty in walking, not elsewhere classified: Secondary | ICD-10-CM

## 2021-10-13 DIAGNOSIS — R278 Other lack of coordination: Secondary | ICD-10-CM

## 2021-10-13 NOTE — Therapy (Addendum)
OUTPATIENT PHYSICAL THERAPY NEURO TREATMENT NOTE/RE-CERT    Patient Name: Cheyenne Martinez MRN: [id number] DOB:[birth year], 46 y.o., female Today's Date: 10/13/2021   PCP: Anabel Bene MD REFERRING PROVIDER: Anabel Bene MD   PT End of Session - 10/13/21 1249     Visit Number 11    Number of Visits 24    Date for PT Re-Evaluation 10/12/21    Authorization Type 1/10 eval 07/20/21    PT Start Time 0845    PT Stop Time 0929    PT Time Calculation (min) 44 min    Equipment Utilized During Treatment Gait belt    Activity Tolerance Patient tolerated treatment well    Behavior During Therapy [facility] for tasks assessed/performed                     No past medical history on file. No past surgical history on file. Patient Active Problem List   Diagnosis Date Noted   CVA (cerebral vascular accident) ([place]) 07/01/2021   New onset type 2 diabetes mellitus ([place]) 07/01/2021   Dyslipidemia 07/01/2021   Hypertensive urgency 07/01/2021     ONSET DATE: 07/01/21  REFERRING DIAG: CVA  THERAPY DIAG:  Difficulty in walking, not elsewhere classified  Muscle weakness (generalized)  Other lack of coordination  Unsteadiness on feet  Rationale for Evaluation and Treatment Rehabilitation  SUBJECTIVE:                                                                                                                                                                                              SUBJECTIVE STATEMENT:  Pt reports no pain currently. No stumbles or falls. She reports no concerns currently.   Pt accompanied by: family member, mother Vaughan Martinez  PERTINENT HISTORY: Patient is a 46 year old female who presented to ER with AMS on 07/01/21; MRI showed early subacute left PCA distribution infarct with ischemic changes within the left frontotemporal and right parietal lobe. Associated mild petechial blood products at the left occipital lobe without frank hemorrhagic transformation or  significant mass effect. 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     Patient also has new diagnosis of hypertension and diabetes. Per patient's evaluation with OT. Patient's daughter is now living with patient s/p stroke. Patient is very aphasic.  Patient has had one fall. Patient was working at [employer] and independent prior to stroke. Patient is walking without anything.   PAIN:  Are you having pain? No  PRECAUTIONS: Fall  WEIGHT BEARING RESTRICTIONS: No  PLOF: Independent  PATIENT GOALS: to get stronger  and use RLE   OBJECTIVE:  The following measure were taken from previous session notes unless otherwise noted.  DIAGNOSTIC FINDINGS: MRI showed early subacute left PCA distribution infarct with ischemic changes within the left frontotemporal and right parietal lobe. Associated mild petechial blood products at the left occipital lobe without frank hemorrhagic transformation or significant mass effect. 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  COGNITION: Overall cognitive status: Impaired   SENSATION: Light touch: Impaired  on LLE  COORDINATION: Impaired on LLE, limited coordination and spatial awareness.    TODAY'S TREATMENT:  Gait belt donned and CGA provided unless otherwise noted:  TherEx:  3# weights donned: Seated march 2x12 Seated LAQ 2x12 each LE Seated adductor squeeze with physioball 2x12 3 sec holds BLE Seated hamstring curls, 2x15 with GTB YTB hip abduction/ER in sitting 2x20 B  STS, 3x10  Standing hip extension, 2x15 Standing hip abduction, 2x15  Lateral step ups onto 4' step, 2x10 each direction to promote glute musculature firing.  Gait with NBQC around gym and in hallway, ~180'  Gait with no AD, ~120' with one instance of LOB that required minA from therapist to correct.     PATIENT EDUCATION: Education details:  goals, indications, exercise technique Person educated: Patient and Caregiver Education method: Explanation, Demonstration, Tactile cues, and Verbal cues Education comprehension: returned demonstration, verbal cues required, tactile cues required, and needs further education   HOME EXERCISE PROGRAM: no updates on this date, pt to continue as previously given  08/18/21:  Access Code: [email or web address] URL: [email or web address] Date: 07/20/2021 Prepared by: Janna Arch  Exercises - Leg Extension  - 1 x daily - 7 x weekly - 2 sets - 10 reps - 5 hold - Seated March  - 1 x daily - 7 x weekly - 2 sets - 10 reps - 5 hold - Seated Hip Abduction  - 1 x daily - 7 x weekly - 2 sets - 10 reps - 5 hold - Seated Hip Adduction Isometrics with Ball  - 1 x daily - 7 x weekly - 2 sets - 10 reps - 5 hold    GOALS: Goals reviewed with patient? Yes  SHORT TERM GOALS: Target date: 11/24/2021  Patient will be independent in home exercise program to improve strength/mobility for better functional independence with ADLs. Baseline: 6/21: HEP given; 8/30: reports going well, to be advanced future date Goal status: IN PROGRESS  2.  Patient will deny any falls over past 4 weeks to demonstrate improved safety awareness at home and work.  Baseline: 6/21: 1 fall; 8/30: no falls in the last 4 weeks Goal status: MET    LONG TERM GOALS: Target date: 01/05/2022   Patient will increase FOTO score to equal to or greater than  68   to demonstrate statistically significant improvement in mobility and quality of life.  Baseline: 6/21: 49; 8/30: 65 Goal status: Partially MET   2.   Patient (< 29 years old) will complete five times sit to stand test in < 10 seconds indicating an increased LE strength and improved balance Baseline: 6/21: 20 seconds with heavy LUE support; 8/30: 20 sec with LUE support, not heavy weightbearing through UE Goal status: INITIAL  3.  Patient will increase 10 meter walk test to  >1.37m/s as to improve gait speed for better community ambulation and to reduce fall risk. Baseline: 6/21 : 26 seconds with 3 LOB (0.38 m/s) ; 8/30:0.22 m/s with QC, no LOB Goal status:  INITIAL  4.  Patient will increase BLE gross strength to 4+/5 as to improve functional strength for independent gait, increased standing tolerance and increased ADL ability. Baseline: 6/21: see note; 8/30: 4+/5 LLE, 4-/5 RLE Goal status: INITIAL   ASSESSMENT:  CLINICAL IMPRESSION:  Pt performed well today with treatments and was able to demonstrate improved gait mechanics without the AD.  Pt does still have considerable weakness in the glutes of the R LE when ambulating, which was addressed with exercises listed above.  Pt responded well and put forth good effort throughout the entire treatment session.  Pt to continue with strengthening and improving gait mechanics in order to ambulate with decreased risk for falling.  Goal assessment performed at last visit and pt is making improvements as noted above.   Pt will continue to benefit from skilled therapy to address remaining deficits in order to improve overall QoL and return to PLOF.       OBJECTIVE IMPAIRMENTS Abnormal gait, decreased activity tolerance, decreased balance, decreased cognition, decreased coordination, decreased endurance, decreased knowledge of condition, decreased knowledge of use of DME, decreased mobility, difficulty walking, decreased strength, decreased safety awareness, and increased fascial restrictions.   ACTIVITY LIMITATIONS carrying, lifting, bending, standing, squatting, stairs, transfers, hygiene/grooming, locomotion level, and caring for others  PARTICIPATION LIMITATIONS: cleaning, laundry, interpersonal relationship, driving, shopping, community activity, occupation, yard work, and church  PERSONAL FACTORS Age, Behavior pattern, Education, Past/current experiences, Social background, Time since onset of  injury/illness/exacerbation, Transportation, and 3+ comorbidities: CVA, HTN, dyslipidemia, DM type II  are also affecting patient's functional outcome.   REHAB POTENTIAL: Fair    CLINICAL DECISION MAKING: Evolving/moderate complexity  EVALUATION COMPLEXITY: Moderate  PLAN: PT FREQUENCY: 2x/week  PT DURATION: 12 weeks  PLANNED INTERVENTIONS: Therapeutic exercises, Therapeutic activity, Neuromuscular re-education, Balance training, Gait training, Patient/Family education, Joint mobilization, Stair training, Vestibular training, Canalith repositioning, Orthotic/Fit training, DME instructions, Cognitive remediation, Electrical stimulation, Cryotherapy, Moist heat, Splintting, Taping, Vasopneumatic device, Traction, Ultrasound, Biofeedback, and Manual therapy  PLAN FOR NEXT SESSION: strengthening, endurance, and gait interventions, continue plan   Gwenlyn Saran, PT, DPT 10/13/21, 12:59 PM

## 2021-10-13 NOTE — Addendum Note (Signed)
Addended by: Phineas Real on: 10/13/2021 01:06 PM   Modules accepted: Orders

## 2021-10-16 ENCOUNTER — Encounter: Payer: Self-pay | Admitting: Occupational Therapy

## 2021-10-16 NOTE — Therapy (Signed)
Occupational Therapy TREATMENT NOTE/RECERTIFICATION     Patient Name: Cheyenne Martinez MRN: [id number] DOB:06-13-1975, 46 y.o., female Today's Date: 10/10/2021  REFERRING PROVIDER: Theora Master, MD      OT End of Session      Visit Number 14    Number of Visits 24    Date for OT Re-Evaluation 01/02/22    Authorization Time Period Reporting period beginning 09/14/21    OT Start Time 1000    OT Stop Time 1045    OT Time Calculation (min) 45 min    Activity Tolerance Patient tolerated treatment well    Behavior During Therapy [facility] for tasks assessed/performed                       History reviewed. No pertinent past medical history. History reviewed. No pertinent surgical history. Patient Active Problem List   Diagnosis Date Noted   CVA (cerebral vascular accident) (HCC) 07/01/2021   New onset type 2 diabetes mellitus (HCC) 07/01/2021   Dyslipidemia 07/01/2021   Hypertensive urgency 07/01/2021      REFERRING DIAG: CVA  THERAPY DIAG:  Muscle weakness (generalized)  Other lack of coordination  Unsteadiness on feet  Rationale for Evaluation and Treatment Rehabilitation  PERTINENT HISTORY: 46 y.o. African-American female with no chronic medical problems, who presented to the ER with acute onset of altered mental status and difficulty finding words with occasional slurred speech over the last couple of days.  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 Associated mild petechial blood products at the left occipital lobe without frank hemorrhagic transformation or significant mass effect. 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 Patient also has new diagnosis of hypertension and diabetes with A1c of 11.2.  Lipid profile with elevated cholesterol at 238,  triglyceride 159, HDL 39 and LDL 169 with goal less than 70.  PRECAUTIONS: None  SUBJECTIVE:   Reports she is doing better with moving her arm up and down and using her hand.   PAIN:  Are you having pain? No  OBJECTIVE:   TODAY'S TREATMENT:  Therapeutic Exercise: Pt seen for reassessment of skills this date, refer below for measurements of right UE hand function. Right Hand Function:  Grip strength: 25# Lateral pinch strength: 10# fingers slipping on meter with attempts 3 point pinch: unable to hold without fingers slipping 2 point pinch: 5# Shoulder flexion 130 degrees  ABD 142 degrees  Elbow flexion 135 degrees  Elbow Extension -20 degrees  FMC with the 9 hole peg test : Pt able to complete test this date in its entirety for the first time, 5 mins 12 sec  ADL: Pt able to complete dressing self with modified independence Pt feeding self but does requires some assist to cut items Pt is engaging in cooking at home, prepared tacos last night, her daughter assists with meal prep Pt seen for handwriting this date with a variety of grip pens, formulating name in script and print, poor to fair legibility but able to hold pen more securely and formulate letters.   Neuromuscular Reeducation: Pt seen for manipulation of [place] discs, picking up, attempts with turning, flipping and stacking with use of right hand with cues for isolated finger movements and prehension patterns with grasp.    PATIENT EDUCATION: Education details: Pt.caregiver education about  RUE ROM, and opportunities for  engagement of the RUE, and hand during ADL/IADL tasks. Person educated: Patient and Caregiver Education method: Explanation, Demonstration, and Verbal cues Education comprehension: verbalized understanding, returned demonstration, verbal cues required, and needs further education   HOME EXERCISE PROGRAM Pt. currently has a HEP for right hand strengthening with light blue theraputty. Pt./brother  education about daily UE ROM. Encouraged patient to use her right hand as much as possible at home with grasping and reaching tasks as well as bilateral hand use.      OT Short Term Goals      OT SHORT TERM GOAL #1   Title Pt will perform RUE HEP for increasing strength and coordination with min vc.    Baseline 10/10/2021: continue to update HEP as pt progresses with HEP. 09/14/2021:  Pt. Is engaging her right hand during more tasks at home. Continue with ongoing pt./caregiver education. Eval: initiated at eval; requires mod A with gripping and rolling putty    Time 6    Period Weeks    Status ongoing   Target Date  11/15/2021       OT SHORT TERM GOAL #2   Title Family will provide min vc to increase attention to R side for pt to more easily navigate her environment.    Baseline 10/10/2021:  decreased cues for right side awareness during daily tasks. 09/14/2021: Pt. Is now requiring fewer cues for right sided awareness when navigating through her environment. Eval: initiated education at eval as family was unaware of any visual decline (pt presents with mild R sided inattention and possible R field cut)    Time 6    Period Weeks    Status ongoing   Target Date 11/15/2021             OT Long Term Goals       OT LONG TERM GOAL #1   Title Pt will increase FOTO score to 45 or better to indicate perceived increased performance with daily tasks.    Baseline 09/14/2021: 48 Eval: FOTO 36   Time 12    Period Weeks    Status Achieved   Target Date 12/07/2021        OT LONG TERM GOAL #2   Title Pt will increase RUE MMT by 1 muscle grade for improved engagement of RUE into self care tasks.    Baseline 09/14/2021: RUE shoulder 3/5, elbow 4/5, wrist 4/5. Eval: Pt using L non-dominant arm for all daily tasks; R shoulder grossly 3-, elbow 4-, wrist 3+    Time 12    Period Weeks    Status ongoing   Target Date 01/02/2022     OT LONG TERM GOAL #3   Title Pt will increase R grip strength by  7# to enable pt. to hold and carry light ADL supplies in R dominant hand.    Baseline 10/10/2021: 25# R 09/14/2021: R: 24# Eval: R 0, L 72    Time 12    Period Weeks    Status ongoing   Target Date 01/02/2022     OT LONG TERM GOAL #4   Title Pt will write name with R dominant hand with 50% legibility    Baseline 10/10/2021: Pt able to write name with improved legibility and in script however legibility remains poor to fair.  09/14/2021: Using a built-up adaptive pen, the Pt. was able to formulate letters of her name with written text very close together, deviating above the line, and with 25% legibility.  Eval: Pt. can  hold pen in R hand with OT assisting with set up, but pt can not make a mark on the paper    Time 12    Period Weeks    Status Ongoing   Target Date 01/02/2022     OT LONG TERM GOAL #5   Title Pt. will improve R hand dexterity for picking up pills with dominant hand.    Baseline 10/10/2021:  able to complete 9 hole peg test in 5  mins, 12 sec. 09/14/2021:  9 hole peg test: 2 min. & 27 sec. To place 3 pegs,43. Sec. To remove 9 pegs.  Eval: R 9 hole unable (uses L non-dominant hand to pick up pills)    Time 12    Period Weeks    Status Ongoing   Target Date 01/02/2022             Plan - 07/28/21 1149     Clinical Impression Statement Pt has continued to make good progress with right UE function.  She  requiring decreased cues for right sided awareness.  She is able to demonstrate increased initiation of right hand use in daily tasks, she continues to require encouragement to use right hand and to engage in bilateral UE tasks for neuro reeducation of RUE.  She is now able to demonstrate completion of 9 hole peg test in its entirety in 5 mins, 12 secs.  She is dressing herself with modified independence, self feeding, requires some assist with cutting food.  She is engaging in meal preparation with assistance and supervision. Grip strength and pinch continue to improve.  She  continues to benefit from skilled OT services to maximize safety and independence in necessary daily tasks.    OT Occupational Profile and History Problem Focused Assessment - Including review of records relating to presenting problem    Occupational performance deficits (Please refer to evaluation for details): ADL's;IADL's;Work;Leisure    Body Structure / Function / Physical Skills ADL;GMC;ROM;UE functional use;Balance;Endurance;FMC;Pain;Strength;Coordination;Dexterity;Gait;IADL;Sensation;Tone;Vision    Cognitive Skills Attention;Safety Awareness;Understand;Emotional;Temperament/Personality    Rehab Potential Good    Clinical Decision Making Several treatment options, min-mod task modification necessary    Comorbidities Affecting Occupational Performance: None    Modification or Assistance to Complete Evaluation  Min-Moderate modification of tasks or assist with assess necessary to complete eval    OT Frequency 2x / week    OT Duration 12 weeks    OT Treatment/Interventions Self-care/ADL training;Therapeutic exercise;Therapist, nutritional;Therapeutic activities;Visual/perceptual remediation/compensation;Balance training;Coping strategies training;Cryotherapy;Moist Heat;Neuromuscular education;DME and/or AE instruction;Manual Therapy;Passive range of motion;Patient/family education    OT Home Exercise Plan Initiated theraputty exercises for gripping and rolling putty with RUE    Consulted and Agree with Plan of Care Patient;Family member/caregiver    Family Member Consulted Mother, Vaughan Basta and daughter, Henrene Dodge, OT 10/16/2021, 6:49 PM

## 2021-10-17 ENCOUNTER — Ambulatory Visit: Payer: Medicaid Other | Admitting: Speech Pathology

## 2021-10-17 ENCOUNTER — Ambulatory Visit: Payer: Medicaid Other

## 2021-10-17 DIAGNOSIS — R278 Other lack of coordination: Secondary | ICD-10-CM

## 2021-10-17 DIAGNOSIS — I63532 Cerebral infarction due to unspecified occlusion or stenosis of left posterior cerebral artery: Secondary | ICD-10-CM

## 2021-10-17 DIAGNOSIS — M6281 Muscle weakness (generalized): Secondary | ICD-10-CM

## 2021-10-17 DIAGNOSIS — R4701 Aphasia: Secondary | ICD-10-CM

## 2021-10-17 DIAGNOSIS — R482 Apraxia: Secondary | ICD-10-CM

## 2021-10-17 NOTE — Therapy (Signed)
Occupational Therapy NEURO TREATMENT NOTE     Patient Name: Cheyenne Martinez MRN: [id number] DOB:Nov 27, 1975, 46 y.o., female Today's Date: 10/10/2021  REFERRING PROVIDER: Theora Master, MD      OT End of Session      Visit Number 14    Number of Visits 24    Date for OT Re-Evaluation 01/02/22    Authorization Time Period Reporting period beginning 09/14/21    OT Start Time 1000    OT Stop Time 1045    OT Time Calculation (min) 45 min    Activity Tolerance Patient tolerated treatment well    Behavior During Therapy [facility] for tasks assessed/performed                       No past medical history on file. No past surgical history on file. 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 African-American female with no chronic medical problems, who presented to the ER with acute onset of altered mental status and difficulty finding words with occasional slurred speech over the last couple of days.  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 Associated mild petechial blood products at the left occipital lobe without frank hemorrhagic transformation or significant mass effect. 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 Patient also has new diagnosis of hypertension and diabetes with A1c of 11.2.  Lipid profile with elevated cholesterol at 238,  triglyceride 159, HDL 39 and LDL 169 with goal less than 70.  PRECAUTIONS: None  SUBJECTIVE:   Mother reported they had a nice birthday celebration for pt over the weekend.  PAIN:  Are you having pain? No  OBJECTIVE:   TODAY'S TREATMENT:  Therapeutic Exercise: Facilitated grip strengthening for R hand with hand gripper at light resistance with 1 red band for 3 sets 10 reps.  Assist for set up of gripper in hand.  Facilitated pinch strengthening with use of therapy resistant clothespins to target lateral pinch on the R hand;  Able to pinch yellow, red, and green pins onto vertical dowel.  Used L hand to stabilize dowel for R hand placement.    Neuromuscular Reeducation: Pt seen dexterity skills.  Practiced 2 handed tasks, using L hand to hold a dowel while the R hand strung washers overtop the dowel.  With R hand pt practiced picking up washers from a non-skid surface (washcloth) with a 2 point pinch, then sliding washer to edge of table, grasping with a 2 point pinch, then moving washer to palm, storing multiple in hand while reaching (able to perform without dropping from palm), then moving washers from palm to finger tip to discard onto table top.  Extra time and cues for prehension techniques.  PATIENT EDUCATION: Education details: HEP progression with quarters (picking up from edge of table, storing, translatory movements) Person educated: Patient and Caregiver Education method: Explanation, Demonstration, and Verbal cues Education comprehension: verbalized understanding, returned demonstration, verbal cues required, and needs  further education   HOME EXERCISE PROGRAM Pt. currently has a HEP for right hand strengthening with light blue theraputty. Pt./brother education about daily UE ROM. Encouraged patient to use her right hand as much as possible at home with grasping and reaching tasks as well as bilateral hand use.      OT Short Term Goals      OT SHORT TERM GOAL #1   Title  Pt will perform RUE HEP for increasing strength and coordination with min vc.    Baseline 10/10/2021: continue to update HEP as pt progresses with HEP. 09/14/2021:  Pt. Is engaging her right hand during more tasks at home. Continue with ongoing pt./caregiver education. Eval: initiated at eval; requires mod A with gripping and rolling putty    Time 6    Period Weeks    Status ongoing   Target Date  11/15/2021       OT SHORT TERM GOAL #2   Title Family will provide min vc to increase attention to R side for pt to more easily navigate her environment.    Baseline 10/10/2021:  decreased cues for right side awareness during daily tasks. 09/14/2021: Pt. Is now requiring fewer cues for right sided awareness when navigating through her environment. Eval: initiated education at eval as family was unaware of any visual decline (pt presents with mild R sided inattention and possible R field cut)    Time 6    Period Weeks    Status ongoing   Target Date 11/15/2021             OT Long Term Goals       OT LONG TERM GOAL #1   Title Pt will increase FOTO score to 45 or better to indicate perceived increased performance with daily tasks.    Baseline 09/14/2021: 48 Eval: FOTO 36   Time 12    Period Weeks    Status Achieved   Target Date 12/07/2021        OT LONG TERM GOAL #2   Title Pt will increase RUE MMT by 1 muscle grade for improved engagement of RUE into self care tasks.    Baseline 09/14/2021: RUE shoulder 3/5, elbow 4/5, wrist 4/5. Eval: Pt using L non-dominant arm for all daily tasks; R shoulder grossly 3-, elbow 4-, wrist 3+    Time 12    Period Weeks    Status ongoing   Target Date 01/02/2022     OT LONG TERM GOAL #3   Title Pt will increase R grip strength by 7# to enable pt. to hold and carry light ADL supplies in R dominant hand.    Baseline 10/10/2021: 25# R 09/14/2021: R: 24# Eval: R 0, L 72    Time 12    Period Weeks    Status ongoing   Target Date 01/02/2022     OT LONG TERM  GOAL #4   Title Pt will write name with R dominant hand with 50% legibility    Baseline 10/10/2021: Pt able to write name with improved legibility and in script however legibility remains poor to fair.  09/14/2021: Using a built-up adaptive pen, the Pt. was able to formulate letters of her name with written text very close together, deviating above the line, and with 25% legibility.  Eval: Pt. can hold pen in R hand with OT assisting with set up, but pt can not make a mark on the paper    Time 12    Period Weeks  Status Ongoing   Target Date 01/02/2022     OT LONG TERM GOAL #5   Title Pt. will improve R hand dexterity for picking up pills with dominant hand.    Baseline 10/10/2021:  able to complete 9 hole peg test in 5  mins, 12 sec. 09/14/2021:  9 hole peg test: 2 min. & 27 sec. To place 3 pegs,43. Sec. To remove 9 pegs.  Eval: R 9 hole unable (uses L non-dominant hand to pick up pills)    Time 12    Period Weeks    Status Ongoing   Target Date 01/02/2022             Plan - 07/28/21 1149     Clinical Impression Statement Pt has continues to make good progress with right UE function.  Pt was able to use a lateral pinch for yellow, red, and green resistive clips today.  Heavy focus on in R hand dexterity skills using washers.  When picking washers up from the table non-skid surface was required (washcloth) or sliding washer to the edge of the table.  Pt practiced both moving washers from finger tips to palm for storage, and moving washers from palm back to fingertips, requiring cues for technique and extra time.  Several dropped washers with these translatory movements but successful with about 75% of her attempts.  She continues to benefit from skilled OT services to maximize safety and independence in necessary daily tasks.    OT Occupational Profile and History Problem Focused Assessment - Including review of records relating to presenting problem    Occupational performance deficits  (Please refer to evaluation for details): ADL's;IADL's;Work;Leisure    Body Structure / Function / Physical Skills ADL;GMC;ROM;UE functional use;Balance;Endurance;FMC;Pain;Strength;Coordination;Dexterity;Gait;IADL;Sensation;Tone;Vision    Cognitive Skills Attention;Safety Awareness;Understand;Emotional;Temperament/Personality    Rehab Potential Good    Clinical Decision Making Several treatment options, min-mod task modification necessary    Comorbidities Affecting Occupational Performance: None    Modification or Assistance to Complete Evaluation  Min-Moderate modification of tasks or assist with assess necessary to complete eval    OT Frequency 2x / week    OT Duration 12 weeks    OT Treatment/Interventions Self-care/ADL training;Therapeutic exercise;Therapist, nutritional;Therapeutic activities;Visual/perceptual remediation/compensation;Balance training;Coping strategies training;Cryotherapy;Moist Heat;Neuromuscular education;DME and/or AE instruction;Manual Therapy;Passive range of motion;Patient/family education    OT Home Exercise Plan Initiated theraputty exercises for gripping and rolling putty with RUE    Consulted and Agree with Plan of Care Patient;Family member/caregiver    Family Member Consulted Mother, Vaughan Basta and daughter, Jake Bathe, MS, OTR/L  Darleene Cleaver, [place] 10/17/2021, 4:12 PM

## 2021-10-17 NOTE — Therapy (Addendum)
OUTPATIENT SPEECH LANGUAGE PATHOLOGY TREATMENT NOTE   Patient Name: Cheyenne Martinez MRN: [id number] DOB:05-16-1975, 46 y.o., female Today's Date: 10/17/2021  PCP: No PCP REFERRING PROVIDER: Gurney Maxin, MD   End of Session - 10/08/21 1917     Visit Number 14    Number of Visits 25    Date for SLP Re-Evaluation 10/19/20203   Authorization Type  Medicaid [employer]aid [employer]   Authorization Time Period 08/01/2021 thru 10/31/202    Authorization - Visit Number 11   Authorization - Number of Visits 16    Progress Note Due on Visit 20   SLP Start Time 1050   SLP Stop Time  1130   SLP Time Calculation (min) 40 min    Activity Tolerance Patient tolerated treatment well             No past medical history on file. No past surgical history on file. 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 Evolving early subacute left PCA distribution infarct as above,  with additional scattered small volume ischemic changes within the  left frontotemporal and right parietal lobe as above. Associated  mild petechial blood products at the left occipital lobe without  frank hemorrhagic transformation or significant mass effect.  2. Underlying mild chronic microvascular ischemic disease with  chronic  right cerebellar infarct.    07/04/2021 CTA HEAD NECK W WO CONTRAST  IMPRESSION:  Evolving recent infarcts as seen on recent MRI. There is likely mild  petechial hemorrhage associated with the left parieto-occipital  infarct. No significant mass effect.  Plaque at the left greater than right ICA origins without  hemodynamically significant stenosis. Marked stenosis at the right  vertebral origin without apparent flow limitation.  Intracranial atherosclerosis involving anterior and posterior  circulations. No proximal intracranial vessel occlusion.    TODAY'S TREATMENT:    Skilled treatment session focused on pt's language goals. SLP facilitated the session by providing the following interventions:    Pt continued to state that she was "46" years of age. Total multi-modal assistance provided in addition to her mother verifying pt's age 46.   Pt requested to use SLP's pen and wrote down "46" to verify. Pt's handwriting with non-dominate hand was very legible.   SLP further facilitated session by using  written information for pt to copy the names of her children; written info used to support day of week and day for tomorrow; pt able to copy each word; written info used for month - given 3 choices, pt able to pick correct month and began to say date of birthday, given written numbers 10 thru 15, pt able to count up to 15 and say 15 intelligibility; cues were not able to be faded    Mother reports improved use of functional phrases with improved speech intelligibility as well    PATIENT EDUCATION:  Education details: functional verbal communication is not a reasonable goal for the foreseeable future; benefits of using a SGD Person educated: patient and her brother Education method: Explanation, Demonstration, and Verbal cues Education comprehension: needs further education   GOALS: Goals reviewed with patient? YES  SHORT TERM GOALS: Target date: 10 sessions  Given maximal multimodal  cues, pt will increase speech approximation by repeating rote conversational phrases with 75% intelligibility. Baseline: unintelligible despite maximal cues 10/06/2021 Goal status: 50%; ongoing  2.  Pt will use a SGD to answer simple biographical questions at 50% accuracy given frequent maximal verbal and maximal visual cues. Baseline: no accuracy 10/06/2021 Goal status: discontinued, pt uninterested in using SGD  3.  Given maximal multimodal cues, pt will follow 1-step directions with 50% accuracy.  Baseline: previous goal discontinued; 0% accuracy 10/06/2021 Goal status: INITIAL   LONG TERM GOALS: Target date: 11/17/2021  Using multimodal communication, pt will express basic wants and needs in 8 out of 10 opportunities.  Baseline: unable Goal status: NOT MET  2.  Using multimodal communication, pt will answer basic questions in 8 out of 10 opportunities.  Baseline: unable Goal status: NOT MET  3. The patient will name 5 common household objects at 50% accuracy given frequent maximum verbal and frequent maximum phonemic cues.  Baseline: unable  Goal status: INITIAL  4. Given maximal multimodal cues, pt will follow 1-step directions with 60% accuracy.  Baseline: unable  Goal status: INITIAL   ASSESSMENT:  CLINICAL IMPRESSION: Pt demonstrated some ability to possibly use writing as a means for communication.    OBJECTIVE IMPAIRMENTS include expressive language, receptive language, aphasia, and dysarthria. These impairments are limiting patient from return to work, managing medications, managing appointments, managing finances, household responsibilities, ADLs/IADLs, and effectively communicating at home and in community. Factors affecting potential to achieve goals and functional outcome are severity of impairments. Patient will benefit from skilled SLP services to address above impairments and improve overall function.  REHAB POTENTIAL: Guarded; poor health literacy,  severity of deficits, self-limiting behaviors  PLAN: SLP FREQUENCY: 1xweek  SLP DURATION: 12 weeks  PLANNED INTERVENTIONS: Language facilitation, Cueing hierachy, Internal/external aids, Functional tasks, Multimodal communication approach, and SLP instruction and feedback   Orien Mayhall B. Rutherford Nail, M.S., CCC-SLP, Mining engineer Certified Brain Injury [place]  [facility] Office [phone] Ascom [phone] Fax [phone]

## 2021-10-19 ENCOUNTER — Ambulatory Visit: Payer: Medicaid Other

## 2021-10-19 ENCOUNTER — Encounter: Payer: Managed Care, Other (non HMO) | Admitting: Speech Pathology

## 2021-10-20 ENCOUNTER — Ambulatory Visit: Payer: Medicaid Other | Admitting: Physical Therapy

## 2021-10-20 DIAGNOSIS — I63532 Cerebral infarction due to unspecified occlusion or stenosis of left posterior cerebral artery: Secondary | ICD-10-CM

## 2021-10-20 DIAGNOSIS — R2681 Unsteadiness on feet: Secondary | ICD-10-CM

## 2021-10-20 DIAGNOSIS — M6281 Muscle weakness (generalized): Secondary | ICD-10-CM

## 2021-10-20 DIAGNOSIS — R262 Difficulty in walking, not elsewhere classified: Secondary | ICD-10-CM

## 2021-10-20 DIAGNOSIS — R4701 Aphasia: Secondary | ICD-10-CM | POA: Diagnosis not present

## 2021-10-20 NOTE — Therapy (Signed)
OUTPATIENT PHYSICAL THERAPY NEURO TREATMENT NOTE    Patient Name: Cheyenne Martinez MRN: [id number] DOB:01-02-1976, 46 y.o., female Today's Date: 10/20/2021   PCP: Morene Crocker MD REFERRING PROVIDER: Morene Crocker MD   PT End of Session - 10/20/21 0853     Visit Number 12    Number of Visits 35    Date for PT Re-Evaluation 01/05/22    Authorization Type 1/10 eval 07/20/21    PT Start Time 0854    PT Stop Time 0929    PT Time Calculation (min) 35 min    Equipment Utilized During Treatment Gait belt    Activity Tolerance Patient tolerated treatment well    Behavior During Therapy [facility] for tasks assessed/performed                      No past medical history on file. No past surgical history on file. 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 No stumbles or falls. She reports no concerns currently.   Pt accompanied by: family member, mother [name]  PERTINENT HISTORY: Patient is a 46 year old female who presented to ER with AMS on 07/01/21; MRI showed early subacute left PCA distribution infarct with ischemic changes within the left frontotemporal and right parietal lobe. Associated mild petechial blood products at the left occipital lobe without  frank hemorrhagic transformation or significant mass effect. 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     Patient also has new diagnosis of hypertension and diabetes. Per patient's evaluation with OT. Patient's daughter is now living with patient s/p stroke. Patient is very aphasic.  Patient has had one fall. Patient was working at [employer] and independent prior to stroke. Patient is walking without anything.   PAIN:  Are you having pain? No  PRECAUTIONS: Fall  WEIGHT BEARING RESTRICTIONS: No  PLOF: Independent  PATIENT GOALS: to get stronger and use RLE   OBJECTIVE:  The following measure were taken from previous session notes unless otherwise noted.  DIAGNOSTIC FINDINGS: MRI showed early subacute left PCA distribution infarct with ischemic changes within the left frontotemporal and right parietal lobe. Associated mild petechial blood products at the left occipital lobe without frank hemorrhagic transformation or significant mass effect. 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  COGNITION: Overall cognitive status: Impaired   SENSATION: Light touch: Impaired  on LLE  COORDINATION: Impaired on LLE, limited coordination and spatial awareness.    TODAY'S TREATMENT:  Gait belt donned and CGA provided unless otherwise noted:  TherEx:  3# weights donned: Seated march 2x12 Seated LAQ 2x10 each LE, attempted with ball squeeze but unable to fully complete range of motion with this  Seated adductor squeeze with physioball 2x12 3 sec holds BLE Seated hamstring curls, 2x15 with GTB YTB hip abduction/ER in sitting 15*5 sec holds B  3 way R LE hedge hog  taps with 3# AW, targeting LE coordination and gluteal muscle activation ( targets anterolateral, lateral and posterolateral) x 10 ea spot Standing hip extension taps, 2x15 Standing march on airex pad  with UE x 10 ea; manual assistance and verbal cues to prevent R knee hyperextension, with cues  and PT manual assistance pt able to copmlete 75% of reps without hyperextension.   Gait with no AD, ~120' with no LOB and cues for step pattern. Pt ambulates with R LE knee hyperextention.  [place] due to lack of quad control.     PATIENT EDUCATION: Education details: goals, indications, exercise technique Person educated: Patient and Caregiver Education method: Explanation, Demonstration, Tactile cues, and Verbal cues Education comprehension: returned demonstration, verbal cues required, tactile cues required, and needs further education   HOME EXERCISE PROGRAM: no updates on this date, pt to continue as previously given  08/18/21:  Access Code: [email or web address] URL: [email or web address] Date: 07/20/2021 Prepared by: Janna Arch  Exercises - Leg Extension  - 1 x daily - 7 x weekly - 2 sets - 10 reps - 5 hold - Seated March  - 1 x daily - 7 x weekly - 2 sets - 10 reps - 5 hold - Seated Hip Abduction  - 1 x daily - 7 x weekly - 2 sets - 10 reps - 5 hold - Seated Hip Adduction Isometrics with Ball  - 1 x daily - 7 x weekly - 2 sets - 10 reps - 5 hold    GOALS: Goals reviewed with patient? Yes  SHORT TERM GOALS: Target date: 11/24/2021  Patient will be independent in home exercise program to improve strength/mobility for better functional independence with ADLs. Baseline: 6/21: HEP given; 8/30: reports going well, to be advanced future date Goal status: IN PROGRESS  2.  Patient will deny any falls over past 4 weeks to demonstrate improved safety awareness at home and work.  Baseline: 6/21: 1 fall; 8/30: no falls in the last 4 weeks Goal status: MET    LONG TERM GOALS: Target date: 01/05/2022   Patient will increase FOTO score to equal to or greater than  68   to demonstrate statistically significant improvement in mobility and quality of life.  Baseline: 6/21: 49; 8/30:  65 Goal status: Partially MET   2.   Patient (< 31 years old) will complete five times sit to stand test in < 10 seconds indicating an increased LE strength and improved balance  Baseline: 6/21: 20 seconds with heavy LUE support; 8/30: 20 sec with LUE support, not heavy weightbearing through UE Goal status: INITIAL  3.  Patient will increase 10 meter walk test to >1.51m/s as to improve gait speed for better community ambulation and to reduce fall risk. Baseline: 6/21 : 26 seconds with 3 LOB (0.38 m/s) ; 8/30:0.22 m/s with QC, no LOB Goal status: INITIAL  4.  Patient will increase BLE gross strength to 4+/5 as to improve functional strength for independent gait, increased standing tolerance and increased ADL ability. Baseline: 6/21: see note; 8/30: 4+/5 LLE, 4-/5 RLE Goal status: INITIAL   ASSESSMENT:  CLINICAL IMPRESSION:  Pt performed well today with treatments and was able to demonstrate improved gait mechanics without the AD.  Pt does still have considerable weakness in the glutes of the R LE and knee extensors when ambulating, which was addressed with exercises listed above.  Pt responded well and put forth good effort throughout the entire treatment session.  Pt to continue with strengthening and improving gait mechanics in order to ambulate with decreased risk for falling.  Patient somewhat pressured with her inability to communicate some issue regarding her AFO.  Pt will continue to benefit from skilled physical therapy intervention to address impairments, improve QOL, and attain therapy goals.       OBJECTIVE IMPAIRMENTS Abnormal gait, decreased activity tolerance, decreased balance, decreased cognition, decreased coordination, decreased endurance, decreased knowledge of condition, decreased knowledge of use of DME, decreased mobility, difficulty walking, decreased strength, decreased safety awareness, and increased fascial restrictions.   ACTIVITY LIMITATIONS carrying, lifting,  bending, standing, squatting, stairs, transfers, hygiene/grooming, locomotion level, and caring for others  PARTICIPATION LIMITATIONS: cleaning, laundry, interpersonal relationship, driving, shopping, community activity, occupation, yard work, and church  PERSONAL FACTORS Age, Behavior pattern, Education, Past/current experiences, Social background, Time since onset of injury/illness/exacerbation, Transportation, and 3+ comorbidities: CVA, HTN, dyslipidemia, DM type II  are also affecting patient's functional outcome.   REHAB POTENTIAL: Fair    CLINICAL DECISION MAKING: Evolving/moderate complexity  EVALUATION COMPLEXITY: Moderate  PLAN: PT FREQUENCY: 2x/week  PT DURATION: 12 weeks  PLANNED INTERVENTIONS: Therapeutic exercises, Therapeutic activity, Neuromuscular re-education, Balance training, Gait training, Patient/Family education, Joint mobilization, Stair training, Vestibular training, Canalith repositioning, Orthotic/Fit training, DME instructions, Cognitive remediation, Electrical stimulation, Cryotherapy, Moist heat, Splintting, Taping, Vasopneumatic device, Traction, Ultrasound, Biofeedback, and Manual therapy  PLAN FOR NEXT SESSION: strengthening, endurance, and gait interventions, continue plan   Rivka Barbara PT, DPT   10/20/21, 9:48 AM

## 2021-10-24 ENCOUNTER — Ambulatory Visit: Payer: Medicaid Other

## 2021-10-24 DIAGNOSIS — I63532 Cerebral infarction due to unspecified occlusion or stenosis of left posterior cerebral artery: Secondary | ICD-10-CM

## 2021-10-24 DIAGNOSIS — M6281 Muscle weakness (generalized): Secondary | ICD-10-CM

## 2021-10-24 DIAGNOSIS — R4701 Aphasia: Secondary | ICD-10-CM | POA: Diagnosis not present

## 2021-10-24 DIAGNOSIS — R278 Other lack of coordination: Secondary | ICD-10-CM

## 2021-10-24 NOTE — Therapy (Signed)
Occupational Therapy NEURO TREATMENT NOTE     Patient Name: Cheyenne Martinez MRN: [id number] DOB:[birth year], 46 y.o., female Today's Date: 10/10/2021  REFERRING PROVIDER: Theora Master, MD      OT End of Session      Visit Number 14    Number of Visits 24    Date for OT Re-Evaluation 01/02/22    Authorization Time Period Reporting period beginning 09/14/21    OT Start Time 1000    OT Stop Time 1045    OT Time Calculation (min) 45 min    Activity Tolerance Patient tolerated treatment well    Behavior During Therapy [facility] for tasks assessed/performed                       No past medical history on file. No past surgical history on file. 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 African-American female with no chronic medical problems, who presented to the ER with acute onset of altered mental status and difficulty finding words with occasional slurred speech over the last couple of days.  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 Associated mild petechial blood products at the left occipital lobe without frank hemorrhagic transformation or significant mass effect. 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 Patient also has new diagnosis of hypertension and diabetes with A1c of 11.2.  Lipid profile with elevated cholesterol at 238,  triglyceride 159, HDL 39 and LDL 169 with goal less than 70.  PRECAUTIONS: None  SUBJECTIVE:   Pt arrived with mother today.  Pt acknowledged having a nice weekend. PAIN:  Are you having pain? No  OBJECTIVE:   TODAY'S TREATMENT:  Therapeutic Exercise: Facilitated grip strengthening for R hand with hand gripper at light resistance with 1 red band for 3 sets 10 reps.  Assist for set up of gripper in hand.  Completed BUE strengthening with 2# dowel to perform shoulder flex and ER behind head x10 each.  Pt required visual and vc to achieve max end range on the RUE with above movements.    Neuromuscular Reeducation: Facilitated hand strengthening and coordination skills with use of hand gripper set at 6.6#.  Pt placed pegs into pegboard and removed pegs with hand gripper with min guard-min A with hand gripper.  Pt then practiced removing pegs with R hand (no gripper), vc from OT to reach into forward and diagonal patterns with an outstretched arm instead of compensating with a forward lean from the trunk.    Self Care: Practiced 2 handed coordination skills opening push and twist pill containers.  Pt gripped bottle in R hand and twisted top with L.  Practiced pill (bead) manipulation skills picking up pills one at a time, storing multiple in palm, and discarding a handful into pill bottle.  Pt practiced raking to scoop a pile from the table top into palm of hand in prep for discarding into pill bottle.  PATIENT EDUCATION: Education details: HEP progression with quarters (picking up from edge of table, storing, translatory movements) Person educated: Patient and Caregiver Education method: Explanation, Demonstration, and Verbal cues Education comprehension: verbalized understanding, returned demonstration, verbal cues required, and needs further education   HOME EXERCISE PROGRAM Pt. currently has a HEP for right hand strengthening with light blue theraputty. Pt./brother education about daily  UE ROM. Encouraged patient to use her right hand as much as possible at home with grasping and reaching tasks as well as bilateral hand use.      OT Short Term Goals      OT SHORT TERM GOAL #1   Title Pt will perform RUE HEP for increasing strength and coordination with min vc.    Baseline 10/10/2021: continue to update HEP as pt progresses with HEP. 09/14/2021:  Pt. Is engaging her right hand during more tasks at home. Continue with ongoing pt./caregiver education. Eval: initiated at eval; requires mod A with gripping and rolling putty    Time 6    Period Weeks    Status ongoing   Target Date  11/15/2021       OT SHORT TERM GOAL #2   Title Family will provide min vc to increase attention to R side for pt to more easily navigate her environment.    Baseline 10/10/2021:  decreased cues for right side awareness during daily tasks. 09/14/2021: Pt. Is now requiring fewer cues for right sided awareness when navigating through her environment. Eval: initiated education at eval as family was unaware of any visual decline (pt presents with mild R sided inattention and possible R field cut)    Time 6    Period Weeks    Status ongoing   Target Date 11/15/2021             OT Long Term Goals       OT LONG TERM GOAL #1   Title Pt will increase FOTO score to 45 or better to indicate perceived increased performance with daily tasks.    Baseline 09/14/2021: 48 Eval: FOTO 36   Time 12    Period Weeks    Status Achieved   Target Date 12/07/2021        OT LONG TERM GOAL #2   Title Pt will increase RUE MMT by 1 muscle grade for improved engagement of RUE into self care tasks.    Baseline 09/14/2021: RUE shoulder 3/5, elbow 4/5, wrist 4/5. Eval: Pt using L non-dominant arm for all daily tasks; R shoulder grossly 3-, elbow 4-, wrist 3+    Time 12    Period Weeks    Status ongoing   Target Date 01/02/2022     OT LONG TERM GOAL #3   Title Pt will increase R grip strength by 7# to enable pt. to  hold and carry light ADL supplies in R dominant hand.    Baseline 10/10/2021: 25# R 09/14/2021: R: 24# Eval: R 0, L 72    Time 12    Period Weeks    Status ongoing   Target Date 01/02/2022     OT LONG TERM GOAL #4   Title Pt will write name with R dominant hand with 50% legibility    Baseline 10/10/2021: Pt able to write name with improved legibility and in script however legibility remains poor to fair.  09/14/2021: Using a built-up adaptive pen, the Pt. was able to formulate letters of her name with written text very close together, deviating above the line,  and with 25% legibility.  Eval: Pt. can hold pen in R hand with OT assisting with set up, but pt can not make a mark on the paper    Time 12    Period Weeks    Status Ongoing   Target Date 01/02/2022     OT LONG TERM GOAL #5   Title Pt. will improve R hand dexterity for picking up pills with dominant hand.    Baseline 10/10/2021:  able to complete 9 hole peg test in 5  mins, 12 sec. 09/14/2021:  9 hole peg test: 2 min. & 27 sec. To place 3 pegs,43. Sec. To remove 9 pegs.  Eval: R 9 hole unable (uses L non-dominant hand to pick up pills)    Time 12    Period Weeks    Status Ongoing   Target Date 01/02/2022             Plan - 07/28/21 1149     Clinical Impression Statement Pt continues to make good progress with right UE function.  Pt began with good participation, but appeared to lose motivation last 10 min of session, showing disinterest in activities, less eye contact, and requiring more encouragement from therapist to continue participation to end of session, increased need for rest periods.  Pt did well with pill manipulation skills, but struggled to store multiple in palm without pills sliding off the ulnar side of her palm.  Pt practiced raking a pile of pills into palm, but dropped several d/t loose grip.  Pt continues to present with RUE weakness and coordination deficits.  She continues to benefit from skilled OT services to  maximize safety and independence in necessary daily tasks.    OT Occupational Profile and History Problem Focused Assessment - Including review of records relating to presenting problem    Occupational performance deficits (Please refer to evaluation for details): ADL's;IADL's;Work;Leisure    Body Structure / Function / Physical Skills ADL;GMC;ROM;UE functional use;Balance;Endurance;FMC;Pain;Strength;Coordination;Dexterity;Gait;IADL;Sensation;Tone;Vision    Cognitive Skills Attention;Safety Awareness;Understand;Emotional;Temperament/Personality    Rehab Potential Good    Clinical Decision Making Several treatment options, min-mod task modification necessary    Comorbidities Affecting Occupational Performance: None    Modification or Assistance to Complete Evaluation  Min-Moderate modification of tasks or assist with assess necessary to complete eval    OT Frequency 2x / week    OT Duration 12 weeks    OT Treatment/Interventions Self-care/ADL training;Therapeutic exercise;Building services engineer;Therapeutic activities;Visual/perceptual remediation/compensation;Balance training;Coping strategies training;Cryotherapy;Moist Heat;Neuromuscular education;DME and/or AE instruction;Manual Therapy;Passive range of motion;Patient/family education    OT Home Exercise Plan Initiated theraputty exercises for gripping and rolling putty with RUE    Consulted and Agree with Plan of Care Patient;Family member/caregiver    Family Member Consulted Mother, [name] and daughter, Karren Cobble, MS, OTR/L  [employer] Dials, [place] 10/24/2021, 3:08 PM

## 2021-10-26 ENCOUNTER — Ambulatory Visit: Payer: Medicaid Other

## 2021-10-27 ENCOUNTER — Ambulatory Visit: Payer: Medicaid Other

## 2021-10-27 ENCOUNTER — Ambulatory Visit: Payer: Medicaid Other | Admitting: Speech Pathology

## 2021-10-27 DIAGNOSIS — I63532 Cerebral infarction due to unspecified occlusion or stenosis of left posterior cerebral artery: Secondary | ICD-10-CM

## 2021-10-27 DIAGNOSIS — M6281 Muscle weakness (generalized): Secondary | ICD-10-CM

## 2021-10-27 DIAGNOSIS — R4701 Aphasia: Secondary | ICD-10-CM

## 2021-10-27 DIAGNOSIS — R2681 Unsteadiness on feet: Secondary | ICD-10-CM

## 2021-10-27 DIAGNOSIS — R262 Difficulty in walking, not elsewhere classified: Secondary | ICD-10-CM

## 2021-10-27 DIAGNOSIS — R278 Other lack of coordination: Secondary | ICD-10-CM

## 2021-10-27 NOTE — Therapy (Signed)
OUTPATIENT PHYSICAL THERAPY NEURO TREATMENT NOTE    Patient Name: Cheyenne Martinez MRN: [id number] DOB:08-16-1975, 46 y.o., female Today's Date: 10/28/2021   PCP: Anabel Bene MD REFERRING PROVIDER: Anabel Bene MD   PT End of Session - 10/27/21 1022     Visit Number 13    Number of Visits 35    Date for PT Re-Evaluation 01/05/22    Authorization Type 1/10 eval 07/20/21    PT Start Time 1015    PT Stop Time 1055    PT Time Calculation (min) 40 min    Equipment Utilized During Treatment Gait belt    Activity Tolerance Patient tolerated treatment well    Behavior During Therapy [facility] for tasks assessed/performed                      No past medical history on file. No past surgical history on file. 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 Mother reports she is walking fairly well at home without a device and only using her AFO.    Pt accompanied by: family member, mother Vaughan Basta  PERTINENT HISTORY: Patient is a 46 year old female who presented to ER with AMS on 07/01/21; MRI showed early subacute left PCA distribution infarct with ischemic changes within the left frontotemporal and right parietal  lobe. Associated mild petechial blood products at the left occipital lobe without frank hemorrhagic transformation or significant mass effect. 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     Patient also has new diagnosis of hypertension and diabetes. Per patient's evaluation with OT. Patient's daughter is now living with patient s/p stroke. Patient is very aphasic.  Patient has had one fall. Patient was working at [employer] and independent prior to stroke. Patient is walking without anything.   PAIN:  Are you having pain? No  PRECAUTIONS: Fall  WEIGHT BEARING RESTRICTIONS: No  PLOF: Independent  PATIENT GOALS: to get stronger and use RLE   OBJECTIVE:  The following measure were taken from previous session notes unless otherwise noted.  DIAGNOSTIC FINDINGS: MRI showed early subacute left PCA distribution infarct with ischemic changes within the left frontotemporal and right parietal lobe. Associated mild petechial blood products at the left occipital lobe without frank hemorrhagic transformation or significant mass effect. 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  COGNITION: Overall cognitive status: Impaired   SENSATION: Light touch: Impaired  on LLE  COORDINATION: Impaired on LLE, limited coordination and spatial awareness.    TODAY'S TREATMENT:  Gait belt donned and CGA provided unless otherwise noted:  TherEx:  Seated march 2x12 reps with RTB Seated LAQ 2x10 each LE (increased time on Right LE)  Seated hamstring curls, 2x15 with GTB Seated hip flex/abd/add up and over hedgehog x 15 reps. (Increased difficulty with coordination of right LE)    3 way R LE hedge hog  taps with 3# AW, targeting LE coordination and gluteal muscle activation ( targets anterolateral, lateral and posterolateral) x 10 ea spot Standing hip extension taps, x 15 Sit to  stand x 8 reps with BLE; x 10 reps with left LE on top of airex pad (patient with initial difficulty but did improve with practice)  Standing (step tap) with Left LE onto yoga block - attempting to facilitate increased RLE Weightbearing. (Very difficult with some coordination difficulty)  Standing march onto edge of TM x 10 ea; with BUE Suppport- manual assistance and verbal cues to prevent R knee hyperextension, with cues  and PT manual assistance pt able to copmlete 75% of reps without hyperextension.   Gait with no AD, 68' with no LOB and cues for step pattern. Pt ambulates with R LE knee hyperextention and terminated walk due to fatigue.    PATIENT EDUCATION: Education details: goals, indications, exercise technique Person educated: Patient and Caregiver Education method: Explanation, Demonstration, Tactile cues, and Verbal cues Education comprehension: returned demonstration, verbal cues required, tactile cues required, and needs further education   HOME EXERCISE PROGRAM: no updates on this date, pt to continue as previously given  08/18/21:  Access Code: [email or web address] URL: [email or web address] Date: 07/20/2021 Prepared by: Janna Arch  Exercises - Leg Extension  - 1 x daily - 7 x weekly - 2 sets - 10 reps - 5 hold - Seated March  - 1 x daily - 7 x weekly - 2 sets - 10 reps - 5 hold - Seated Hip Abduction  - 1 x daily - 7 x weekly - 2 sets - 10 reps - 5 hold - Seated Hip Adduction Isometrics with Ball  - 1 x daily - 7 x weekly - 2 sets - 10 reps - 5 hold    GOALS: Goals reviewed with patient? Yes  SHORT TERM GOALS: Target date: 11/24/2021  Patient will be independent in home exercise program to improve strength/mobility for better functional independence with ADLs. Baseline: 6/21: HEP given; 8/30: reports going well, to be advanced future date Goal status: IN PROGRESS  2.  Patient will deny any falls over past 4 weeks to demonstrate improved safety awareness at  home and work.  Baseline: 6/21: 1 fall; 8/30: no falls in the last 4 weeks Goal status: MET    LONG TERM GOALS: Target date: 01/05/2022   Patient will increase FOTO score to  equal to or greater than  68   to demonstrate statistically significant improvement in mobility and quality of life.  Baseline: 6/21: 49; 8/30: 65 Goal status: Partially MET   2.   Patient (< 57 years old) will complete five times sit to stand test in < 10 seconds indicating an increased LE strength and improved balance Baseline: 6/21: 20 seconds with heavy LUE support; 8/30: 20 sec with LUE support, not heavy weightbearing through UE Goal status: INITIAL  3.  Patient will increase 10 meter walk test to >1.67ms as to improve gait speed for better community ambulation and to reduce fall risk. Baseline: 6/21 : 26 seconds with 3 LOB (0.38 m/s) ; 8/30:0.22 m/s with QC, no LOB Goal status: INITIAL  4.  Patient will increase BLE gross strength to 4+/5 as to improve functional strength for independent gait, increased standing tolerance and increased ADL ability. Baseline: 6/21: see note; 8/30: 4+/5 LLE, 4-/5 RLE Goal status: INITIAL   ASSESSMENT:  CLINICAL IMPRESSION:  Pt performed well for majority of visit with some issues with coordinating activities on Right LE and difficulty performing more weight bearing through R LE. Patient continues to hyperextend her right knee and continues to demo considerable weakness in the glutes of the R LE. At end of visit she was fatigued so limited walking.  Pt to continue with strengthening and improving gait mechanics in order to ambulate with decreased risk for falling.    Pt will continue to benefit from skilled physical therapy intervention to address impairments, improve QOL, and attain therapy goals.       OBJECTIVE IMPAIRMENTS Abnormal gait, decreased activity tolerance, decreased balance, decreased cognition, decreased coordination, decreased endurance, decreased knowledge  of condition, decreased knowledge of use of DME, decreased mobility, difficulty walking, decreased strength, decreased safety awareness, and increased fascial restrictions.   ACTIVITY LIMITATIONS carrying, lifting, bending, standing, squatting, stairs, transfers, hygiene/grooming, locomotion level, and caring for others  PARTICIPATION LIMITATIONS: cleaning, laundry, interpersonal relationship, driving, shopping, community activity, occupation, yard work, and church  PERSONAL FACTORS Age, Behavior pattern, Education, Past/current experiences, Social background, Time since onset of injury/illness/exacerbation, Transportation, and 3+ comorbidities: CVA, HTN, dyslipidemia, DM type II  are also affecting patient's functional outcome.   REHAB POTENTIAL: Fair    CLINICAL DECISION MAKING: Evolving/moderate complexity  EVALUATION COMPLEXITY: Moderate  PLAN: PT FREQUENCY: 2x/week  PT DURATION: 12 weeks  PLANNED INTERVENTIONS: Therapeutic exercises, Therapeutic activity, Neuromuscular re-education, Balance training, Gait training, Patient/Family education, Joint mobilization, Stair training, Vestibular training, Canalith repositioning, Orthotic/Fit training, DME instructions, Cognitive remediation, Electrical stimulation, Cryotherapy, Moist heat, Splintting, Taping, Vasopneumatic device, Traction, Ultrasound, Biofeedback, and Manual therapy  PLAN FOR NEXT SESSION: strengthening, endurance, and gait interventions, continue plan  JOllen Bowl PT  10/28/21, 12:07 PM

## 2021-10-27 NOTE — Therapy (Addendum)
OUTPATIENT SPEECH LANGUAGE PATHOLOGY TREATMENT NOTE   Patient Name: Cheyenne Martinez MRN: [id number] DOB:1975/08/26, 46 y.o., female Today's Date: 10/27/2021  PCP: No PCP REFERRING PROVIDER: Gurney Maxin, MD   End of Session - 10/08/21 1917     Visit Number 15   Number of Visits 25    Date for SLP Re-Evaluation 10/19/20203   Authorization Type [place] Medicaid Prepaid [facility]   Authorization Time Period 08/01/2021 thru 10/31/202    Authorization - Visit Number 12   Authorization - Number of Visits 16    Progress Note Due on Visit 20   SLP Start Time 1050   SLP Stop Time  1130   SLP Time Calculation (min) 40 min    Activity Tolerance Patient tolerated treatment well             No past medical history on file. No past surgical history on file. 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 Evolving early subacute left PCA distribution infarct as above,  with additional scattered small volume ischemic changes within the  left frontotemporal and right parietal lobe as above. Associated  mild petechial blood products at the left occipital lobe without  frank hemorrhagic transformation or significant mass effect.  2. Underlying mild chronic microvascular ischemic disease with  chronic right cerebellar infarct.    07/04/2021 CTA  HEAD NECK W WO CONTRAST  IMPRESSION:  Evolving recent infarcts as seen on recent MRI. There is likely mild  petechial hemorrhage associated with the left parieto-occipital  infarct. No significant mass effect.  Plaque at the left greater than right ICA origins without  hemodynamically significant stenosis. Marked stenosis at the right  vertebral origin without apparent flow limitation.  Intracranial atherosclerosis involving anterior and posterior  circulations. No proximal intracranial vessel occlusion.    TODAY'S TREATMENT:    Skilled treatment session focused on pt's language goals. SLP facilitated the session by providing the following interventions:   Skilled information shared with pt and her mother on ways to facilitate increased awareness of expressive language deficits. This is recommended to increase pt's awareness of errors within functional tasks.      PATIENT EDUCATION: Education details: functional verbal communication is not a reasonable goal for the foreseeable future; benefits of using a SGD Person educated: patient and her brother Education method: Explanation, Demonstration, and Verbal cues Education comprehension: needs further education   GOALS: Goals reviewed with patient? YES  SHORT TERM GOALS: Target date: 10 sessions  Given maximal multimodal cues, pt will increase speech approximation by repeating rote conversational phrases with 75% intelligibility. Baseline: unintelligible despite maximal cues 10/06/2021 Goal status: 50%; ongoing 10/27/2021: Goal status: NOT MET 2.  Pt will use a SGD to answer simple biographical questions at 50% accuracy given frequent maximal verbal and maximal visual cues. Baseline: no accuracy 10/06/2021 Goal status: discontinued, pt uninterested in using SGD  3.  Given  maximal multimodal cues, pt will follow 1-step directions with 50% accuracy.  Baseline: previous goal discontinued; 0% accuracy 10/06/2021 Goal status:  INITIAL 10/27/2021: Goal status: NOT MET  LONG TERM GOALS: Target date: 11/17/2021  Using multimodal communication, pt will express basic wants and needs in 8 out of 10 opportunities.  Baseline: unable Goal status: NOT MET  2.  Using multimodal communication, pt will answer basic questions in 8 out of 10 opportunities.  Baseline: unable Goal status: NOT MET  3. The patient will name 5 common household objects at 50% accuracy given frequent maximum verbal and frequent maximum phonemic cues.  Baseline: unable  Goal status: NOT MET  4. Given maximal multimodal cues, pt will follow 1-step directions with 60% accuracy.  Baseline: unable  Goal status: NOT MET   ASSESSMENT:  CLINICAL IMPRESSION: Pt continues with moderate global aphasia that is c/b moderate expressive and receptive deficits. Pt remains largely unaware of errors and family support is a great help in supplementing her communication. As such, pt is not aware of her expressive language deficits and states that she doesn't want to know, she states that she is only here "because of her (pointing to her mother)." At this time, pt is no longer interesting attending skilled ST intervention. Education provided on requesting another order in the future should pt change her mind.      Tuana Hoheisel B. Rutherford Nail, M.S., CCC-SLP, Mining engineer Certified Brain Injury [place]  [place] Office [phone] Ascom [phone] Fax [phone]

## 2021-10-31 ENCOUNTER — Ambulatory Visit: Payer: Medicaid Other

## 2021-11-02 ENCOUNTER — Ambulatory Visit: Payer: Medicaid Other | Attending: Neurology

## 2021-11-02 ENCOUNTER — Ambulatory Visit: Payer: Medicaid Other | Admitting: Physical Therapy

## 2021-11-02 DIAGNOSIS — M6281 Muscle weakness (generalized): Secondary | ICD-10-CM | POA: Insufficient documentation

## 2021-11-02 DIAGNOSIS — R278 Other lack of coordination: Secondary | ICD-10-CM | POA: Insufficient documentation

## 2021-11-02 DIAGNOSIS — R262 Difficulty in walking, not elsewhere classified: Secondary | ICD-10-CM | POA: Insufficient documentation

## 2021-11-02 DIAGNOSIS — R2681 Unsteadiness on feet: Secondary | ICD-10-CM | POA: Insufficient documentation

## 2021-11-02 DIAGNOSIS — I63532 Cerebral infarction due to unspecified occlusion or stenosis of left posterior cerebral artery: Secondary | ICD-10-CM | POA: Insufficient documentation

## 2021-11-02 NOTE — Therapy (Signed)
Occupational Therapy NEURO TREATMENT NOTE     Patient Name: Cheyenne Martinez MRN: [id number] DOB:Alesana 27, 1977, 46 y.o., female Today's Date: 10/10/2021  REFERRING PROVIDER: Gurney Maxin, MD   OT End of Session - 11/02/21 0839     Visit Number 17    Number of Visits 38    Date for OT Re-Evaluation 01/02/22    Authorization Time Period Reporting period beginning 09/14/21    OT Start Time 0845    OT Stop Time 0930    OT Time Calculation (min) 45 min    Activity Tolerance Patient tolerated treatment well    Behavior During Therapy [facility] for tasks assessed/performed              No past medical history on file. No past surgical history on file. 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 African-American female with no chronic medical problems, who presented to the ER with acute onset of altered mental status and difficulty finding words with occasional slurred speech over the last couple of days.  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 Associated mild petechial blood products at the left occipital lobe without frank hemorrhagic transformation or significant mass effect. 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 Patient also has new diagnosis of hypertension and diabetes with A1c of 11.2.  Lipid profile with elevated cholesterol at 238, triglyceride  159, HDL 39 and LDL 169 with goal less than 70.  PRECAUTIONS: None  SUBJECTIVE:  Pt arrived with mother today.  Pt showed little interest and motivation today with therapy session, but participatory with encouragement.  PAIN:  Are you having pain? No  OBJECTIVE:   TODAY'S TREATMENT:  Therapeutic Exercise: Facilitated grip strengthening for R hand with hand gripper at moderate resistance with 1 green band for 3 sets 10 reps.  Assist for set up of gripper in hand.  Completed dowel climb for increasing active shoulder flexion, requiring min A for RUE throughout; 5 reps.  Performed active assisted R shoulder flexion and abd x10 reps, then bilat movements for same, active on the L, OT providing AAROM on the R for 10 reps each.  Used 1.5# dowel to complete bilat tricep extension overhead; OT providing support to keep elbows and upper arms stabilized for correct movement pattern.      Neuromuscular Reeducation: Participated in balloon batting with hands clasped, then RUE only, cues for increasing reach above shoulder level to hit balloon instead of waiting for balloon to drop in lap before return hit.  Attempted 1# wrist weight to the R wrist, but not yet able to tolerate.  Facilitated R hand dexterity skills working to place and remove ball pegs from pegboard.  OT assisted to place ball peg in 3 point pinch pattern into pt's R hand; pt was able to place 10 pegs with cues for tighter pinch to keep ball peg from coming out of position in prep for placement.    PATIENT EDUCATION: Education  details: 3 point pinch pattern; RUE strengthening Person educated: Patient and Caregiver Education method: Explanation, Demonstration, and Verbal cues Education comprehension: verbalized understanding, returned demonstration, verbal cues required, and needs further education   HOME EXERCISE PROGRAM Pt. currently has a HEP for right hand strengthening with light blue theraputty. Pt./brother education about daily UE  ROM. Encouraged patient to use her right hand as much as possible at home with grasping and reaching tasks as well as bilateral hand use.      OT Short Term Goals      OT SHORT TERM GOAL #1   Title Pt will perform RUE HEP for increasing strength and coordination with min vc.    Baseline 10/10/2021: continue to update HEP as pt progresses with HEP. 09/14/2021:  Pt. Is engaging her right hand during more tasks at home. Continue with ongoing pt./caregiver education. Eval: initiated at eval; requires mod A with gripping and rolling putty    Time 6    Period Weeks    Status ongoing   Target Date  11/15/2021       OT SHORT TERM GOAL #2   Title Family will provide min vc to increase attention to R side for pt to more easily navigate her environment.    Baseline 10/10/2021:  decreased cues for right side awareness during daily tasks. 09/14/2021: Pt. Is now requiring fewer cues for right sided awareness when navigating through her environment. Eval: initiated education at eval as family was unaware of any visual decline (pt presents with mild R sided inattention and possible R field cut)    Time 6    Period Weeks    Status ongoing   Target Date 11/15/2021             OT Long Term Goals       OT LONG TERM GOAL #1   Title Pt will increase FOTO score to 45 or better to indicate perceived increased performance with daily tasks.    Baseline 09/14/2021: 48 Eval: FOTO 36   Time 12    Period Weeks    Status Achieved   Target Date 12/07/2021        OT LONG TERM GOAL #2   Title Pt will increase RUE MMT by 1 muscle grade for improved engagement of RUE into self care tasks.    Baseline 09/14/2021: RUE shoulder 3/5, elbow 4/5, wrist 4/5. Eval: Pt using L non-dominant arm for all daily tasks; R shoulder grossly 3-, elbow 4-, wrist 3+    Time 12    Period Weeks    Status ongoing   Target Date 01/02/2022     OT LONG TERM GOAL #3   Title Pt will increase R grip strength by 7# to enable pt. to hold  and carry light ADL supplies in R dominant hand.    Baseline 10/10/2021: 25# R 09/14/2021: R: 24# Eval: R 0, L 72    Time 12    Period Weeks    Status ongoing   Target Date 01/02/2022     OT LONG TERM GOAL #4   Title Pt will write name with R dominant hand with 50% legibility    Baseline 10/10/2021: Pt able to write name with improved legibility and in script however legibility remains poor to fair.  09/14/2021: Using a built-up adaptive pen, the Pt. was able to formulate letters of her name with written text very close together, deviating above the line, and with 25% legibility.  Eval: Pt. can hold pen  in R hand with OT assisting with set up, but pt can not make a mark on the paper    Time 12    Period Weeks    Status Ongoing   Target Date 01/02/2022     OT LONG TERM GOAL #5   Title Pt. will improve R hand dexterity for picking up pills with dominant hand.    Baseline 10/10/2021:  able to complete 9 hole peg test in 5  mins, 12 sec. 09/14/2021:  9 hole peg test: 2 min. & 27 sec. To place 3 pegs,43. Sec. To remove 9 pegs.  Eval: R 9 hole unable (uses L non-dominant hand to pick up pills)    Time 12    Period Weeks    Status Ongoing   Target Date 01/02/2022             Plan - 07/28/21 1149     Clinical Impression Statement Pt showed little interest and motivation today with therapy session, but participatory with encouragement and rest breaks.  Focus today on R hand dexterity and R shoulder strengthening.  Pt requires active assist to reach above 90 degrees shoulder flexion.  Pt tolerated reaching reps to bat balloon and climb the dowel (dowel climb with min A).  Progress noted with ball pegs today.  Pt was able to place 10 pegs today with OT assisting to set peg up in pt's hand using 3 point pinch pattern.  Pt had several dropped pegs but improved with OT providing cues to maintain a stronger pinch as to keep peg from rolling and coming out of position.  Pt continues to present with RUE  weakness and coordination deficits.  She continues to benefit from skilled OT services to maximize safety and independence in necessary daily tasks.    OT Occupational Profile and History Problem Focused Assessment - Including review of records relating to presenting problem    Occupational performance deficits (Please refer to evaluation for details): ADL's;IADL's;Work;Leisure    Body Structure / Function / Physical Skills ADL;GMC;ROM;UE functional use;Balance;Endurance;FMC;Pain;Strength;Coordination;Dexterity;Gait;IADL;Sensation;Tone;Vision    Cognitive Skills Attention;Safety Awareness;Understand;Emotional;Temperament/Personality    Rehab Potential Good    Clinical Decision Making Several treatment options, min-mod task modification necessary    Comorbidities Affecting Occupational Performance: None    Modification or Assistance to Complete Evaluation  Min-Moderate modification of tasks or assist with assess necessary to complete eval    OT Frequency 2x / week    OT Duration 12 weeks    OT Treatment/Interventions Self-care/ADL training;Therapeutic exercise;Therapist, nutritional;Therapeutic activities;Visual/perceptual remediation/compensation;Balance training;Coping strategies training;Cryotherapy;Moist Heat;Neuromuscular education;DME and/or AE instruction;Manual Therapy;Passive range of motion;Patient/family education    OT Home Exercise Plan Initiated theraputty exercises for gripping and rolling putty with RUE    Consulted and Agree with Plan of Care Patient;Family member/caregiver    Family Member Consulted Mother, Vaughan Basta and daughter, Jake Bathe, [place], OTR/L  Darleene Cleaver, [place] 11/02/2021, 4:31 PM

## 2021-11-02 NOTE — Therapy (Signed)
OUTPATIENT PHYSICAL THERAPY NEURO TREATMENT NOTE    Patient Name: Cheyenne Martinez MRN: [id number] DOB:1975/12/05, 46 y.o., female Today's Date: 11/02/2021   PCP: Anabel Bene MD REFERRING PROVIDER: Anabel Bene MD   PT End of Session - 11/02/21 0813     Visit Number 14    Number of Visits 35    Date for PT Re-Evaluation 01/05/22    Authorization Type 1/10 eval 07/20/21    Progress Note Due on Visit 20    PT Start Time 0810    PT Stop Time 0845    PT Time Calculation (min) 35 min    Equipment Utilized During Treatment Gait belt    Activity Tolerance Patient tolerated treatment well    Behavior During Therapy [facility] for tasks assessed/performed                       No past medical history on file. No past surgical history on file. Patient Active Problem List   Diagnosis Date Noted   CVA (cerebral vascular accident) ([place]) 07/01/2021   New onset type 2 diabetes mellitus ([place]) 07/01/2021   Dyslipidemia 07/01/2021   Hypertensive urgency 07/01/2021     ONSET DATE: 07/01/21  REFERRING DIAG: CVA  THERAPY DIAG:  Muscle weakness (generalized)  Difficulty in walking, not elsewhere classified  Unsteadiness on feet  Rationale for Evaluation and Treatment Rehabilitation  SUBJECTIVE:                                                                                                                                                                                              SUBJECTIVE STATEMENT:  Patient reports doing about the same. No changes since lst seen in clinic.    Pt accompanied by: family member, mother Vaughan Basta  PERTINENT HISTORY: Patient is a 46 year old female who presented to ER with AMS on 07/01/21; MRI showed early subacute left PCA distribution infarct with ischemic changes within the left frontotemporal and right parietal lobe. Associated mild petechial blood products at the left occipital lobe without frank hemorrhagic transformation or  significant mass effect. 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     Patient also has new diagnosis of hypertension and diabetes. Per patient's evaluation with OT. Patient's daughter is now living with patient s/p stroke. Patient is very aphasic.  Patient has had one fall. Patient was working at [employer] and independent prior to stroke. Patient is walking without anything.   PAIN:  Are you having pain? No  PRECAUTIONS: Fall  WEIGHT BEARING RESTRICTIONS: No  PLOF: Independent  PATIENT GOALS: to get stronger and use RLE   OBJECTIVE:  The following measure were taken from previous session notes unless otherwise noted.  DIAGNOSTIC FINDINGS: MRI showed early subacute left PCA distribution infarct with ischemic changes within the left frontotemporal and right parietal lobe. Associated mild petechial blood products at the left occipital lobe without frank hemorrhagic transformation or significant mass effect. 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  COGNITION: Overall cognitive status: Impaired   SENSATION: Light touch: Impaired  on LLE  COORDINATION: Impaired on LLE, limited coordination and spatial awareness.    TODAY'S TREATMENT:  Gait belt donned and CGA provided unless otherwise noted:  TherEx:  Seated march 2x12 reps  Seated LAQ 2x10 each LE (increased challenge on Right LE, decreased eccentric control noted as well)  Seated hamstring curls, 2x15 with GTB Seated hip flex/abd/add up andto TB pad x 15 reps. (Increased difficulty with coordination of right LE)    3 way R LE lateral step to TB pad for external cue,unable to accurately place ntire foot on pad but able to complete movement for appropriate gluteal activation.  Standing hip extension taps, x 15 ea LE to theraband pad for external cue, unable to accurately place entire foot on pad but  able to complete movement for appropriate gluteal activation.   Gait with no AD, 21' with no LOB and cues for step pattern. Pt ambulates with R LE knee hyperextention and ended walk upon arrival to OT session.     PATIENT EDUCATION: Education details: goals, indications, exercise technique Person educated: Patient and Caregiver Education method: Explanation, Demonstration, Tactile cues, and Verbal cues Education comprehension: returned demonstration, verbal cues required, tactile cues required, and needs further education   HOME EXERCISE PROGRAM: no updates on this date, pt to continue as previously given  08/18/21:  Access Code: [email or web address] URL: [email or web address] Date: 07/20/2021 Prepared by: Janna Arch  Exercises - Leg Extension  - 1 x daily - 7 x weekly - 2 sets - 10 reps - 5 hold - Seated March  - 1 x daily - 7 x weekly - 2 sets - 10 reps - 5 hold - Seated Hip Abduction  - 1 x daily - 7 x weekly - 2 sets - 10 reps - 5 hold - Seated Hip Adduction Isometrics with Ball  - 1 x daily - 7 x weekly - 2 sets - 10 reps - 5 hold    GOALS: Goals reviewed with patient? Yes  SHORT TERM GOALS: Target date: 11/24/2021  Patient will be independent in home exercise program to improve strength/mobility for better functional independence with ADLs. Baseline: 6/21: HEP given; 8/30: reports going well, to be advanced future date Goal status: IN PROGRESS  2.  Patient will deny any falls over past 4 weeks to demonstrate improved safety awareness at home and work.  Baseline: 6/21: 1 fall; 8/30: no falls in the last 4 weeks Goal status: MET    LONG TERM GOALS: Target date: 01/05/2022   Patient will increase FOTO score to equal to or greater than  68   to demonstrate statistically significant improvement in mobility and quality of life.  Baseline: 6/21: 49; 8/30: 65 Goal status: Partially MET   2.   Patient (< 69 years old) will complete five times sit to stand test in < 10  seconds indicating an increased LE strength and improved balance Baseline: 6/21: 20 seconds with heavy LUE support; 8/30: 20 sec with LUE  support, not heavy weightbearing through UE Goal status: INITIAL  3.  Patient will increase 10 meter walk test to >1.39m/s as to improve gait speed for better community ambulation and to reduce fall risk. Baseline: 6/21 : 26 seconds with 3 LOB (0.38 m/s) ; 8/30:0.22 m/s with QC, no LOB Goal status: INITIAL  4.  Patient will increase BLE gross strength to 4+/5 as to improve functional strength for independent gait, increased standing tolerance and increased ADL ability. Baseline: 6/21: see note; 8/30: 4+/5 LLE, 4-/5 RLE Goal status: INITIAL   ASSESSMENT:  CLINICAL IMPRESSION:  Pt presents with excellent motivation for completion of PT interventions.  Patient continues to hyperextend her right knee and continues to demo considerable weakness in the glutes of the R LE. Pt will continue to benefit from skilled physical therapy intervention to address impairments, improve QOL, and attain therapy goals.        OBJECTIVE IMPAIRMENTS Abnormal gait, decreased activity tolerance, decreased balance, decreased cognition, decreased coordination, decreased endurance, decreased knowledge of condition, decreased knowledge of use of DME, decreased mobility, difficulty walking, decreased strength, decreased safety awareness, and increased fascial restrictions.   ACTIVITY LIMITATIONS carrying, lifting, bending, standing, squatting, stairs, transfers, hygiene/grooming, locomotion level, and caring for others  PARTICIPATION LIMITATIONS: cleaning, laundry, interpersonal relationship, driving, shopping, community activity, occupation, yard work, and church  PERSONAL FACTORS Age, Behavior pattern, Education, Past/current experiences, Social background, Time since onset of injury/illness/exacerbation, Transportation, and 3+ comorbidities: CVA, HTN, dyslipidemia, DM type II   are also affecting patient's functional outcome.   REHAB POTENTIAL: Fair    CLINICAL DECISION MAKING: Evolving/moderate complexity  EVALUATION COMPLEXITY: Moderate  PLAN: PT FREQUENCY: 2x/week  PT DURATION: 12 weeks  PLANNED INTERVENTIONS: Therapeutic exercises, Therapeutic activity, Neuromuscular re-education, Balance training, Gait training, Patient/Family education, Joint mobilization, Stair training, Vestibular training, Canalith repositioning, Orthotic/Fit training, DME instructions, Cognitive remediation, Electrical stimulation, Cryotherapy, Moist heat, Splintting, Taping, Vasopneumatic device, Traction, Ultrasound, Biofeedback, and Manual therapy  PLAN FOR NEXT SESSION: strengthening, endurance, and gait interventions, continue plan  Ollen Bowl, PT  11/02/21, 8:13 AM

## 2021-11-07 ENCOUNTER — Ambulatory Visit: Payer: Medicaid Other | Admitting: Speech Pathology

## 2021-11-07 ENCOUNTER — Ambulatory Visit: Payer: Medicaid Other

## 2021-11-09 ENCOUNTER — Ambulatory Visit: Payer: Medicaid Other

## 2021-11-09 DIAGNOSIS — I63532 Cerebral infarction due to unspecified occlusion or stenosis of left posterior cerebral artery: Secondary | ICD-10-CM

## 2021-11-09 DIAGNOSIS — R278 Other lack of coordination: Secondary | ICD-10-CM

## 2021-11-09 DIAGNOSIS — M6281 Muscle weakness (generalized): Secondary | ICD-10-CM | POA: Diagnosis not present

## 2021-11-09 NOTE — Therapy (Signed)
OCCUPATIONAL THERAPY NEURO DISCHARGE NOTE     Patient Name: Cheyenne Martinez MRN: [id number] DOB:[birth year], 46 y.o., female Today's Date: 10/10/2021  REFERRING PROVIDER: Gurney Maxin, MD   OT End of Session - 11/09/21 0936     Visit Number 18    Number of Visits 38    Date for OT Re-Evaluation 01/02/22    Authorization Time Period Reporting period beginning 09/14/21    OT Start Time 0930    OT Stop Time 1015    OT Time Calculation (min) 45 min    Activity Tolerance Patient tolerated treatment well    Behavior During Therapy [facility] for tasks assessed/performed              No past medical history on file. No past surgical history on file. 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 African-American female with no chronic medical problems, who presented to the ER with acute onset of altered mental status and difficulty finding words with occasional slurred speech over the last couple of days.  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 Associated mild petechial blood products at the left occipital lobe without frank hemorrhagic transformation or significant mass effect. 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 Patient also has new diagnosis of hypertension and diabetes with A1c of 11.2.  Lipid profile with elevated cholesterol at 238, triglyceride  159, HDL 39 and LDL 169 with goal less than 70.  PRECAUTIONS: None  SUBJECTIVE:  Pt/mother acknowledged last OT session today d/t insurance limitations.    PAIN:  Are you having pain? No  OBJECTIVE:   TODAY'S TREATMENT:  Therapeutic Exercise: Objective measures taken and goals updated for discharge.  Facilitated pinching strengthening clipping yellow and red clips onto vertical dowel.  Intermittent assist for hand position and steadying of dowel.  OT reviewed importance of continued participation in HEP, including grip and pinch strengthening with use of theraputty, AROM for the R shoulder, and continuing to engage the RUE with daily tasks as able.  Pt/mother verbalized understanding.        PATIENT EDUCATION: Education details: RUE strengthening Person educated: Patient and Caregiver Education method: Explanation, Demonstration, and Verbal cues Education comprehension: verbalized understanding   HOME EXERCISE PROGRAM Pt. currently has a HEP for right hand strengthening with light blue theraputty. Pt/mother education about daily UE ROM. Encouraged patient to use her right hand as much as possible at home with grasping and reaching tasks as well as bilateral hand use.      OT Short Term Goals      OT SHORT TERM GOAL #1   Title Pt will perform RUE HEP for increasing strength and coordination with min vc.    Baseline Eval: initiated at eval; requires mod A with gripping and rolling putty; 09/14/2021:  Pt. Is engaging her right hand during more tasks at home. Continue with ongoing pt./caregiver education. 10/10/2021: continue to update HEP as pt progresses with HEP; 10/11;23 d/c:  pt/mother are aware of recommendations for RUE strengthening.  Pt denies using theraputty regularly but does engage the R arm with daily tasks as able.     Time 6    Period Weeks    Status Partially met   Target Date  11/15/2021       OT SHORT TERM GOAL #2   Title Family will provide min vc to increase  attention to R side for pt to more easily navigate her environment.    Baseline Eval: initiated education at eval as family was unaware of any visual decline (pt presents with mild R sided inattention and possible R field cut); 09/14/2021: Pt. Is now requiring fewer cues for right sided awareness when navigating through her environment. 10/10/2021:  decreased cues for right side awareness during daily tasks; 11/09/21 d/c: min vc for attention to R side   Time 6    Period Weeks    Status achieved   Target Date 11/15/2021             OT Long Term Goals       OT LONG TERM GOAL #1   Title Pt will increase FOTO score to 45 or better to indicate perceived increased performance with daily tasks.    Baseline Eval: FOTO 36; 09/14/2021: 48; 11/09/21 d/c: 60   Time 12    Period Weeks    Status Achieved   Target Date 12/07/2021        OT LONG TERM GOAL #2   Title Pt will increase RUE MMT by 1 muscle grade for improved engagement of RUE into self care tasks.    Baseline Eval: Pt using L non-dominant arm for all daily tasks; R shoulder grossly 3-, elbow 4-, wrist 3+ 09/14/2021: RUE shoulder 3/5, elbow 4/5, wrist 4/5; 11/09/21 d/c: R shoulder flex 3+, abd 3, elbow flex 4, elbow ext 4+, wrist flex/ext 4/5    Time 12    Period Weeks    Status Partially met   Target Date 01/02/2022     OT LONG TERM GOAL #3   Title Pt will increase R grip strength by 7# to enable pt. to hold and carry light ADL supplies in R dominant hand.    Baseline Eval: R 0, L 72; 09/14/2021: R: 24# ; 10/10/21: R 25#; R 11/09/21: R 20, L 71   Time 12    Period Weeks    Status Partially met   Target Date 01/02/2022     OT LONG TERM GOAL #4   Title Pt will write name with R dominant hand with 50% legibility    Baseline Eval: Pt. can hold pen in R hand with OT assisting with set up, but pt can not make a mark on the paper; 09/14/2021: Using a built-up adaptive pen, the Pt. was able to formulate letters of her name with written text  very close together, deviating above the line, and with 25% legibility.   10/10/2021: Pt able to write name with improved legibility and in script however legibility remains poor to fair.  11/09/21 d/c: able to write most letters of her name with poor to fair legibility   Time 12    Period Weeks    Status Partially met   Target Date 01/02/2022     OT LONG TERM GOAL #5   Title Pt. will improve R hand dexterity for picking up pills with dominant hand.    Baseline Eval: R 9 hole unable (uses L non-dominant hand to pick up  pills); 09/14/2021:  9 hole peg test: 2 min. & 27 sec. to place 3 pegs, 43 sec to remove 9 pegs.  10/10/2021:  able to complete 9 hole peg test in 5  mins, 12 sec. 11/09/21: R 6 min 10 sec, L 28.1 sec (pt picks up pills with L hand, but uses R to help with opening/closing pill bottles).   Time 12    Period Weeks    Status Partially met   Target Date 01/02/2022             Plan - 07/28/21 1149     Clinical Impression Statement Pt was seen by OT for 18 sessions to address visual and RUE deficits following L PCA stroke.  Pt has made strong functional gains, as noted by increased FOTO score from 36 at eval to 60 at discharge.  Pt engages her RUE into self care tasks as a fair-good stabilizer, but pt relies on L non-dominant arm for tasks which require strength and dexterity.  Pt has increased attention to R side, requiring min vc on occasion for R hand proprioception.  HEP was reviewed this date with pt/mother acknowledging understanding and benefits of consistent/daily participation to help pt continue to improve use of the RUE.  OT encouraged new therapy referral in the new year if pt wishes to continue to focus on residual deficits post stroke.  Discharge completed this date d/t visit limitations with insurance within this calendar year.     OT Occupational Profile and History Problem Focused Assessment - Including review of records relating to presenting problem    Occupational  performance deficits (Please refer to evaluation for details): ADL's;IADL's;Work;Leisure    Body Structure / Function / Physical Skills ADL;GMC;ROM;UE functional use;Balance;Endurance;FMC;Pain;Strength;Coordination;Dexterity;Gait;IADL;Sensation;Tone;Vision    Cognitive Skills Attention;Safety Awareness;Understand;Emotional;Temperament/Personality    Rehab Potential Good    Clinical Decision Making Several treatment options, min-mod task modification necessary    Comorbidities Affecting Occupational Performance: None    Modification or Assistance to Complete Evaluation  Min-Moderate modification of tasks or assist with assess necessary to complete eval    OT Frequency 2x / week    OT Duration 12 weeks    OT Treatment/Interventions Self-care/ADL training;Therapeutic exercise;Therapist, nutritional;Therapeutic activities;Visual/perceptual remediation/compensation;Balance training;Coping strategies training;Cryotherapy;Moist Heat;Neuromuscular education;DME and/or AE instruction;Manual Therapy;Passive range of motion;Patient/family education    OT Home Exercise Plan Initiated theraputty exercises for gripping and rolling putty with RUE    Consulted and Agree with Plan of Care Patient;Family member/caregiver    Family Member Consulted Mother, Vaughan Basta and daughter, Jake Bathe, [place], OTR/L  Darleene Cleaver, [place] 11/09/2021, 9:37 AM

## 2021-11-14 ENCOUNTER — Encounter: Payer: Managed Care, Other (non HMO) | Admitting: Speech Pathology

## 2021-11-14 ENCOUNTER — Ambulatory Visit: Payer: Medicaid Other

## 2021-11-16 ENCOUNTER — Ambulatory Visit: Payer: Medicaid Other

## 2021-11-17 ENCOUNTER — Ambulatory Visit: Payer: Medicaid Other

## 2021-11-17 ENCOUNTER — Ambulatory Visit: Payer: Medicaid Other | Admitting: Physical Therapy

## 2021-11-17 DIAGNOSIS — R2681 Unsteadiness on feet: Secondary | ICD-10-CM

## 2021-11-17 DIAGNOSIS — M6281 Muscle weakness (generalized): Secondary | ICD-10-CM

## 2021-11-17 DIAGNOSIS — R262 Difficulty in walking, not elsewhere classified: Secondary | ICD-10-CM

## 2021-11-17 NOTE — Therapy (Signed)
OUTPATIENT PHYSICAL THERAPY NEURO TREATMENT NOTE    Patient Name: Cheyenne Martinez MRN: [id number] DOB:Oct 16, 1975, 46 y.o., female Today's Date: 11/17/2021   PCP: Anabel Bene MD REFERRING PROVIDER: Anabel Bene MD   PT End of Session - 11/17/21 0938     Visit Number 15    Number of Visits 35    Date for PT Re-Evaluation 01/05/22    Authorization Type 1/10 eval 07/20/21    Progress Note Due on Visit 20    PT Start Time 0935    PT Stop Time 1014    PT Time Calculation (min) 39 min    Equipment Utilized During Treatment Gait belt    Activity Tolerance Patient tolerated treatment well    Behavior During Therapy [facility] for tasks assessed/performed                        No past medical history on file. No past surgical history on file. Patient Active Problem List   Diagnosis Date Noted   CVA (cerebral vascular accident) ([place]) 07/01/2021   New onset type 2 diabetes mellitus ([place]) 07/01/2021   Dyslipidemia 07/01/2021   Hypertensive urgency 07/01/2021     ONSET DATE: 07/01/21  REFERRING DIAG: CVA  THERAPY DIAG:  Muscle weakness (generalized)  Difficulty in walking, not elsewhere classified  Unsteadiness on feet  Rationale for Evaluation and Treatment Rehabilitation  SUBJECTIVE:                                                                                                                                                                                              SUBJECTIVE STATEMENT:  Patient reports doing about the same. No changes since lst seen in clinic. Pt caregiver reports still waiting on MD note for new orthotic due to needing specific documentation for new orthotic because of how new ( less than 3 yrs) her current orthotic is.    Pt accompanied by: family member, mother Vaughan Basta  PERTINENT HISTORY: Patient is a 46 year old female who presented to ER with AMS on 07/01/21; MRI showed early subacute left PCA distribution infarct with ischemic  changes within the left frontotemporal and right parietal lobe. Associated mild petechial blood products at the left occipital lobe without frank hemorrhagic transformation or significant mass effect. 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     Patient also has new diagnosis of hypertension and diabetes. Per patient's evaluation with OT. Patient's daughter is now living with patient s/p stroke. Patient is very aphasic.  Patient has had one fall. Patient was  working at [employer] and independent prior to stroke. Patient is walking without anything.   PAIN:  Are you having pain? No  PRECAUTIONS: Fall  WEIGHT BEARING RESTRICTIONS: No  PLOF: Independent  PATIENT GOALS: to get stronger and use RLE   OBJECTIVE:  The following measure were taken from previous session notes unless otherwise noted.  DIAGNOSTIC FINDINGS: MRI showed early subacute left PCA distribution infarct with ischemic changes within the left frontotemporal and right parietal lobe. Associated mild petechial blood products at the left occipital lobe without frank hemorrhagic transformation or significant mass effect. 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  COGNITION: Overall cognitive status: Impaired   SENSATION: Light touch: Impaired  on LLE  COORDINATION: Impaired on LLE, limited coordination and spatial awareness.    TODAY'S TREATMENT:  Gait belt donned and CGA provided unless otherwise noted:  TherEx: Seated TE 2.5# AW donned for the below exercises: Seated march 2x10 reps  Seated LAQ 2x10 each LE 2.5# AW donned  Seated hamstring curls, 2x10 with GTB Seated hip flex/abd/add up and to TB pad x 20 reps. (Increased difficulty with coordination of right LE)   TA:   R LE lateral step to TB pad for external cue,unable to accurately place ntire foot on pad but able to complete  movement for appropriate gluteal activation. 2*10 to R only  Standing hip extension taps, 2*10 ea LE to theraband pad for external cue, unable to accurately place entire foot on pad but able to complete movement for appropriate gluteal activation.   Gait with no AD, 150' with 1 LOB and cues for step pattern. Pt requires CGA and UE assist to recover from LOB  Pt ambulates with R LE knee hyperextention and ended walk upon arrival to OT session.  STS x 10  - Another round of gait as above, no LOB this round, still having knee hyperextension in stance phase of gait.  STS x 10  - another round of walking as above, no Lob continued knee hyperextension during R LE stance phase of gait.     PATIENT EDUCATION: Education details: goals, indications, exercise technique Person educated: Patient and Caregiver Education method: Explanation, Demonstration, Tactile cues, and Verbal cues Education comprehension: returned demonstration, verbal cues required, tactile cues required, and needs further education   HOME EXERCISE PROGRAM: no updates on this date, pt to continue as previously given  08/18/21:  Access Code: [email or web address] URL: [email or web address] Date: 07/20/2021 Prepared by: Janna Arch  Exercises - Leg Extension  - 1 x daily - 7 x weekly - 2 sets - 10 reps - 5 hold - Seated March  - 1 x daily - 7 x weekly - 2 sets - 10 reps - 5 hold - Seated Hip Abduction  - 1 x daily - 7 x weekly - 2 sets - 10 reps - 5 hold - Seated Hip Adduction Isometrics with Ball  - 1 x daily - 7 x weekly - 2 sets - 10 reps - 5 hold    GOALS: Goals reviewed with patient? Yes  SHORT TERM GOALS: Target date: 11/24/2021  Patient will be independent in home exercise program to improve strength/mobility for better functional independence with ADLs. Baseline: 6/21: HEP given; 8/30: reports going well, to be advanced future date Goal status: IN PROGRESS  2.  Patient will deny any falls over past 4 weeks to  demonstrate improved safety awareness at home and work.  Baseline: 6/21: 1 fall; 8/30: no falls  in the last 4 weeks Goal status: MET    LONG TERM GOALS: Target date: 01/05/2022   Patient will increase FOTO score to equal to or greater than  68   to demonstrate statistically significant improvement in mobility and quality of life.  Baseline: 6/21: 49; 8/30: 65 Goal status: Partially MET   2.   Patient (< 61 years old) will complete five times sit to stand test in < 10 seconds indicating an increased LE strength and improved balance Baseline: 6/21: 20 seconds with heavy LUE support; 8/30: 20 sec with LUE support, not heavy weightbearing through UE Goal status: INITIAL  3.  Patient will increase 10 meter walk test to >1.22m/s as to improve gait speed for better community ambulation and to reduce fall risk. Baseline: 6/21 : 26 seconds with 3 LOB (0.38 m/s) ; 8/30:0.22 m/s with QC, no LOB Goal status: INITIAL  4.  Patient will increase BLE gross strength to 4+/5 as to improve functional strength for independent gait, increased standing tolerance and increased ADL ability. Baseline: 6/21: see note; 8/30: 4+/5 LLE, 4-/5 RLE Goal status: INITIAL   ASSESSMENT:  CLINICAL IMPRESSION:  Pt presents with excellent motivation for completion of PT interventions.  Patient continues to hyperextend her right knee and continues to demo considerable weakness in the glutes of the R LE. Continued to target hip strength and knee extension strength for improved ability to control LE with gait. Also progressed to interval training to improve mobility and ambulate greater distances during interventions.  Pt will continue to benefit from skilled physical therapy intervention to address impairments, improve QOL, and attain therapy goals.        OBJECTIVE IMPAIRMENTS Abnormal gait, decreased activity tolerance, decreased balance, decreased cognition, decreased coordination, decreased endurance, decreased  knowledge of condition, decreased knowledge of use of DME, decreased mobility, difficulty walking, decreased strength, decreased safety awareness, and increased fascial restrictions.   ACTIVITY LIMITATIONS carrying, lifting, bending, standing, squatting, stairs, transfers, hygiene/grooming, locomotion level, and caring for others  PARTICIPATION LIMITATIONS: cleaning, laundry, interpersonal relationship, driving, shopping, community activity, occupation, yard work, and church  PERSONAL FACTORS Age, Behavior pattern, Education, Past/current experiences, Social background, Time since onset of injury/illness/exacerbation, Transportation, and 3+ comorbidities: CVA, HTN, dyslipidemia, DM type II  are also affecting patient's functional outcome.   REHAB POTENTIAL: Fair    CLINICAL DECISION MAKING: Evolving/moderate complexity  EVALUATION COMPLEXITY: Moderate  PLAN: PT FREQUENCY: 2x/week  PT DURATION: 12 weeks  PLANNED INTERVENTIONS: Therapeutic exercises, Therapeutic activity, Neuromuscular re-education, Balance training, Gait training, Patient/Family education, Joint mobilization, Stair training, Vestibular training, Canalith repositioning, Orthotic/Fit training, DME instructions, Cognitive remediation, Electrical stimulation, Cryotherapy, Moist heat, Splintting, Taping, Vasopneumatic device, Traction, Ultrasound, Biofeedback, and Manual therapy  PLAN FOR NEXT SESSION: strengthening, endurance, and gait interventions, continue plan  Ollen Bowl, PT  11/17/21, 10:17 AM

## 2021-11-21 ENCOUNTER — Ambulatory Visit: Payer: Medicaid Other

## 2021-11-21 ENCOUNTER — Encounter: Payer: Managed Care, Other (non HMO) | Admitting: Speech Pathology

## 2021-11-23 ENCOUNTER — Ambulatory Visit: Payer: Medicaid Other

## 2021-11-24 ENCOUNTER — Ambulatory Visit: Payer: Medicaid Other

## 2021-11-24 DIAGNOSIS — I63532 Cerebral infarction due to unspecified occlusion or stenosis of left posterior cerebral artery: Secondary | ICD-10-CM

## 2021-11-24 DIAGNOSIS — R2681 Unsteadiness on feet: Secondary | ICD-10-CM

## 2021-11-24 DIAGNOSIS — R262 Difficulty in walking, not elsewhere classified: Secondary | ICD-10-CM

## 2021-11-24 DIAGNOSIS — R278 Other lack of coordination: Secondary | ICD-10-CM

## 2021-11-24 DIAGNOSIS — M6281 Muscle weakness (generalized): Secondary | ICD-10-CM | POA: Diagnosis not present

## 2021-11-24 NOTE — Therapy (Addendum)
OUTPATIENT PHYSICAL THERAPY NEURO TREATMENT NOTE    Patient Name: Cheyenne Martinez MRN: [id number] DOB:April 09, 1975, 46 y.o., female Today's Date: 11/24/2021   PCP: Anabel Bene MD REFERRING PROVIDER: Anabel Bene MD   PT End of Session - 11/24/21 0942     Visit Number 16    Number of Visits 35    Date for PT Re-Evaluation 01/05/22    Authorization Type [place] Medicaid [employer]: 08/01/21-12/01/21 (16 visits)    Progress Note Due on Visit 20    PT Start Time 0931    PT Stop Time 1011    PT Time Calculation (min) 40 min    Equipment Utilized During Treatment Gait belt    Activity Tolerance Patient tolerated treatment well    Behavior During Therapy [facility] for tasks assessed/performed              No past medical history on file. No past surgical history on file. 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 Pt doing well. No halloween plans so far.     Pt accompanied by: family member, mother Vaughan Basta  PERTINENT HISTORY: Patient is a 46 year old female who presented to ER with AMS on 07/01/21; MRI showed early subacute left PCA distribution infarct with ischemic changes within the left frontotemporal and right parietal  lobe. Associated mild petechial blood products at the left occipital lobe without frank hemorrhagic transformation or significant mass effect. 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     Patient also has new diagnosis of hypertension and diabetes. Per patient's evaluation with OT. Patient's daughter is now living with patient s/p stroke. Patient is very aphasic.  Patient has had one fall. Patient was working at [employer] and independent prior to stroke. Patient is walking without anything.   PAIN:  Are you having pain? No  PRECAUTIONS: Fall  WEIGHT BEARING RESTRICTIONS: No  PLOF: Independent  PATIENT GOALS: to get stronger and use RLE   OBJECTIVE:  The following measure were taken from previous session notes unless otherwise noted.  DIAGNOSTIC FINDINGS: MRI showed early subacute left PCA distribution infarct with ischemic changes within the left frontotemporal and right parietal lobe. Associated mild petechial blood products at the left occipital lobe without frank hemorrhagic transformation or significant mass effect. 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  COGNITION: Overall cognitive status: Impaired   SENSATION: Light touch: Impaired  on LLE  COORDINATION: Impaired on LLE, limited coordination and spatial awareness.    TODAY'S TREATMENT: -32ft AMB CCW, no device, AFO: 31m14s -113/22mmHg 165bpm? post walk  -97/32mmHg 108 standing 0 minutes  -111/2mmHg 113BPM standing x1 minute -352ft AMB CCW, no device, AFO: 38m38s -STS from chair 2x12, AFO, no UE support  -cable resisted walking with red mat and plastic step   PATIENT EDUCATION: Education details: goals, indications, exercise technique Person educated: Patient and Caregiver Education method: Explanation, Demonstration, Tactile cues, and Verbal cues Education comprehension: returned  demonstration, verbal cues required, tactile cues required, and needs further education   HOME EXERCISE PROGRAM: no updates on this date, pt to continue as previously given  08/18/21:  Access Code: [email or web address] URL: [email or web address] Date: 07/20/2021 Prepared by: Precious Bard  Exercises - Leg Extension  - 1 x daily - 7 x weekly - 2 sets - 10 reps - 5 hold - Seated March  - 1 x daily - 7 x weekly - 2 sets - 10 reps - 5 hold - Seated Hip Abduction  - 1 x daily - 7 x weekly - 2 sets - 10 reps - 5 hold - Seated Hip Adduction Isometrics with Ball  - 1 x daily - 7 x weekly - 2 sets - 10 reps - 5 hold   GOALS: Goals reviewed with patient? Yes  SHORT TERM GOALS: Target date: 11/24/2021  Patient will be independent in home exercise program to improve strength/mobility for better functional independence with ADLs. Baseline: 6/21: HEP given; 8/30: reports going well, to be advanced future date Goal status: IN PROGRESS  2.  Patient will deny any falls over past 4 weeks to demonstrate improved safety awareness at home and work.  Baseline: 6/21: 1 fall; 8/30: no falls in the last 4 weeks Goal status: MET    LONG TERM GOALS: Target date: 01/05/2022   Patient will increase FOTO score to equal to or greater than  68   to demonstrate statistically significant improvement in mobility and quality of life.  Baseline: 6/21: 49; 8/30: 65 Goal status: Partially MET   2.   Patient (< 10 years old) will complete five times sit to stand test in < 10 seconds indicating an increased LE strength and improved balance Baseline: 6/21: 20 seconds with heavy LUE support; 8/30: 20 sec with LUE support, not heavy weightbearing through UE Goal status: INITIAL  3.  Patient will increase 10 meter walk test to >1.44m/s as to improve gait speed for better community ambulation and to reduce fall risk. Baseline: 6/21 : 26 seconds with 3 LOB (0.38 m/s) ; 8/30:0.22 m/s with QC, no LOB Goal status:  INITIAL  4.  Patient will increase BLE gross strength to 4+/5 as to improve functional strength for independent gait, increased standing tolerance and increased ADL ability. Baseline: 6/21: see note; 8/30: 4+/5 LLE, 4-/5 RLE Goal status: INITIAL  ASSESSMENT:  CLINICAL IMPRESSION: Pt presents with excellent motivation for completion of PT interventions. Progressed to interval training to improve mobility and ambulate greater distances during interventions. Pt has some elevated HR after 1st AMB at 165, quick recover to low 100s. Pt has some orthostasic BP upon standing that recovers to flat after 1 minute.Pt will continue to benefit from skilled physical therapy intervention to address impairments, improve QOL, and attain therapy goals.        OBJECTIVE IMPAIRMENTS Abnormal gait, decreased activity tolerance, decreased balance, decreased cognition, decreased coordination, decreased endurance, decreased knowledge of condition, decreased knowledge of use of DME, decreased mobility, difficulty walking, decreased strength, decreased safety awareness, and increased fascial restrictions.   ACTIVITY LIMITATIONS carrying, lifting, bending, standing, squatting, stairs, transfers, hygiene/grooming, locomotion level, and caring for others  PARTICIPATION LIMITATIONS: cleaning, laundry, interpersonal relationship, driving, shopping, community activity, occupation, yard work, and church  PERSONAL FACTORS Age, Behavior pattern, Education, Past/current experiences, Social background, Time since onset of injury/illness/exacerbation, Transportation, and 3+ comorbidities: CVA, HTN, dyslipidemia, DM type II  are also affecting patient's functional outcome.   REHAB POTENTIAL: Fair    CLINICAL DECISION MAKING: Evolving/moderate complexity  EVALUATION COMPLEXITY: Moderate  PLAN: PT FREQUENCY: 2x/week  PT DURATION: 12 weeks  PLANNED INTERVENTIONS: Therapeutic exercises, Therapeutic activity, Neuromuscular  re-education, Balance training, Gait training, Patient/Family education, Joint mobilization, Stair training, Vestibular training, Canalith repositioning, Orthotic/Fit training, DME instructions, Cognitive remediation, Electrical stimulation, Cryotherapy, Moist heat, Splintting, Taping, Vasopneumatic device, Traction, Ultrasound, Biofeedback, and Manual therapy  PLAN FOR NEXT SESSION: strengthening, endurance, and gait interventions, continue plan  9:55 AM, 11/24/21 Etta Grandchild, PT, DPT Physical Therapist - [place] [phone]     11/24/21, 9:55 AM

## 2021-11-28 ENCOUNTER — Ambulatory Visit: Payer: Medicaid Other

## 2021-11-28 ENCOUNTER — Encounter: Payer: Managed Care, Other (non HMO) | Admitting: Speech Pathology

## 2021-11-30 ENCOUNTER — Ambulatory Visit: Payer: Medicaid Other

## 2021-11-30 ENCOUNTER — Ambulatory Visit: Payer: Medicaid Other | Admitting: Occupational Therapy

## 2021-11-30 ENCOUNTER — Ambulatory Visit: Payer: Medicaid Other | Admitting: Speech Pathology

## 2021-12-05 ENCOUNTER — Ambulatory Visit: Payer: Medicaid Other

## 2021-12-05 ENCOUNTER — Encounter: Payer: Managed Care, Other (non HMO) | Admitting: Speech Pathology

## 2021-12-07 ENCOUNTER — Ambulatory Visit: Payer: Medicaid Other

## 2021-12-07 ENCOUNTER — Ambulatory Visit: Payer: Medicaid Other | Attending: Neurology

## 2021-12-07 DIAGNOSIS — R278 Other lack of coordination: Secondary | ICD-10-CM | POA: Insufficient documentation

## 2021-12-07 DIAGNOSIS — R2681 Unsteadiness on feet: Secondary | ICD-10-CM | POA: Diagnosis present

## 2021-12-07 DIAGNOSIS — R262 Difficulty in walking, not elsewhere classified: Secondary | ICD-10-CM | POA: Diagnosis present

## 2021-12-07 DIAGNOSIS — M6281 Muscle weakness (generalized): Secondary | ICD-10-CM | POA: Diagnosis not present

## 2021-12-07 DIAGNOSIS — I63532 Cerebral infarction due to unspecified occlusion or stenosis of left posterior cerebral artery: Secondary | ICD-10-CM | POA: Insufficient documentation

## 2021-12-07 NOTE — Therapy (Unsigned)
OUTPATIENT PHYSICAL THERAPY NEURO TREATMENT NOTE    Patient Name: Cheyenne Martinez MRN: [id number] DOB:11-20-1975, 46 y.o., female Today's Date: 12/07/2021   PCP: Anabel Bene MD REFERRING PROVIDER: Anabel Bene MD   PT End of Session - 12/07/21 1053     Visit Number 17    Number of Visits 35    Date for PT Re-Evaluation 01/05/22    Authorization Type [place] Medicaid [employer]: 08/01/21-12/31/21 (16 visits)    Authorization Time Period Cert 2/50/53/97/6/73    Authorization - Visit Number 16    Authorization - Number of Visits 16    Progress Note Due on Visit 20    PT Start Time 1020    PT Stop Time 1059    PT Time Calculation (min) 39 min    Equipment Utilized During Treatment Gait belt    Activity Tolerance Patient tolerated treatment well;No increased pain;Patient limited by fatigue    Behavior During Therapy [facility] for tasks assessed/performed              No past medical history on file. No past surgical history on file. 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 Pt doing well. Pt went trick or treating with grandkids.      Pt accompanied by: family member, mother Vaughan Basta  PERTINENT HISTORY: Patient is a 46  year old female who presented to ER with AMS on 07/01/21; MRI showed early subacute left PCA distribution infarct with ischemic changes within the left frontotemporal and right parietal lobe. Associated mild petechial blood products at the left occipital lobe without frank hemorrhagic transformation or significant mass effect. 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 Patient also has new diagnosis of hypertension and diabetes. Per patient's evaluation with OT. Patient's daughter is now living with patient s/p stroke. Patient is very aphasic.  Patient has had one fall. Patient was working at [employer] and independent prior to stroke. Patient walks without AFO but is not using any DME despite significant unsteadiness.    PAIN:  Are you having pain? No  PRECAUTIONS: Fall  WEIGHT BEARING RESTRICTIONS: No  PLOF: Independent  PATIENT GOALS: to get stronger and use RLE   OBJECTIVE:  The following measure were taken from previous session notes unless otherwise noted.  DIAGNOSTIC FINDINGS: MRI showed early subacute left PCA distribution infarct with ischemic changes within the left frontotemporal and right parietal lobe. Associated mild petechial blood products at the left occipital lobe without frank hemorrhagic transformation or significant mass effect. 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  COGNITION: Overall cognitive status: Impaired   SENSATION: Light touch: Impaired  on LLE  COORDINATION: Impaired on LLE, limited coordination and spatial awareness.    TODAY'S TREATMENT: -goals review -FOTO survery -5xSTS -10MWT x3  10x STS from elevated surface (21 inches)  MMT Rt hip extension seated 5/5 10x STS from 23-inch plinth Left foot on 6" step)   High intensity AMB training:  -198f 5lb AW bilat 128ms, no device, Rt AFO, minGuard assist -2 minutes  seated recovery  -16079flb AW bilat 1m32m no device, Rt AFO, minGuard assist -2 minutes seated recovery  -160ft37f AW bilat 1m32s44mo device, Rt AFO, minGuard assist -2 minutes seated recovery  *asked pt to walk transport chair to elevator at end of session, minA of rt hand to chair    PATIENT EDUCATION: Education details: goals, indications, exercise technique Person educated: Patient and Caregiver Education method: Explanation, Demonstration, Tactile cues, and Verbal cues Education comprehension: returned demonstration, verbal cues required, tactile cues required, and needs further education   HOME EXERCISE PROGRAM: no updates on this date, pt to continue as previously given  08/18/21:  Access Code: [email or web address] Date: 07/20/2021 Prepared by: MarinaJanna Archcises - Leg Extension  - 1 x daily - 7 x weekly - 2 sets - 10 reps - 5 hold - Seated March  - 1 x daily - 7 x weekly - 2 sets - 10 reps - 5 hold - Seated Hip Abduction  - 1 x daily - 7 x weekly - 2 sets - 10 reps - 5 hold - Seated Hip Adduction Isometrics with Ball  - 1 x daily - 7 x weekly - 2 sets - 10 reps - 5 hold   GOALS: Goals reviewed with patient? Yes  SHORT TERM GOALS: Target date: 11/24/2021  Patient will be independent in home exercise program to improve strength/mobility for better functional independence with ADLs. Baseline: 6/21: HEP given; 8/30: reports going well, to be advanced future date Goal status: IN PROGRESS  2.  Patient will deny any falls over past 4 weeks to demonstrate improved safety awareness at home and work.  Baseline: 6/21: 1 fall; 8/30: no falls in the last 4 weeks Goal status: MET    LONG TERM GOALS: Target date: 01/05/2022   Patient will increase FOTO score to equal to or greater than  68   to demonstrate statistically significant improvement in mobility and quality of life.  Baseline: 6/21: 49; 8/30: 65; 11/8: 65 Goal status: Progressing    2.   Patient (< 60 yea49 old) will complete five times sit to stand test in < 10 seconds indicating an increased LE strength and improved balance Baseline: 6/21: 20 seconds with heavy LUE support; 8/30: 20  sec with LUE support, not heavy weightbearing through UE; 11/8: 17.7sec Goal status: Progressing   3.  Patient will increase 10 meter walk test to >1.64ms as to improve gait speed for better community ambulation and to reduce fall risk. Baseline: 6/21 : 26 seconds with 3 LOB (0.38 m/s) ; 8/30:0.22 m/s with QC, no LOB; 12/07/21:  Goal status: Progressing   4.  Patient will increase BLE gross strength to 4+/5 as to improve functional strength for independent gait, increased standing tolerance and increased ADL ability. Baseline: 6/21: see note; 8/30: 4+/5 LLE, 4-/5 RLE Goal status: Progressing    ASSESSMENT:  CLINICAL IMPRESSION: Continued to advanced gait training and transfers in intensity and overall volume. Pt quite fatigued by end of session. Author encouraged pt to really embrace daily AMB practice to further her rehab progress overall, particularly as she only has 1 remaining authorized visit for the calendar year. Pt will continue to benefit from skilled physical therapy intervention to address impairments, improve QOL, and attain therapy goals.        OBJECTIVE IMPAIRMENTS Abnormal gait, decreased activity tolerance, decreased balance, decreased cognition, decreased coordination, decreased endurance, decreased knowledge of condition, decreased knowledge of use of DME, decreased mobility, difficulty walking, decreased strength, decreased safety awareness, and increased fascial restrictions.   ACTIVITY LIMITATIONS carrying, lifting, bending, standing, squatting, stairs, transfers, hygiene/grooming, locomotion level, and caring for others  PARTICIPATION LIMITATIONS: cleaning, laundry, interpersonal relationship, driving, shopping, community activity, occupation, yard work, and  church  PERSONAL FACTORS Age, Behavior pattern, Education, Past/current experiences, Social background, Time since onset of injury/illness/exacerbation, Transportation, and 3+ comorbidities: CVA, HTN, dyslipidemia, DM type II  are also affecting patient's functional outcome.   REHAB POTENTIAL: Fair    CLINICAL DECISION MAKING: Evolving/moderate complexity  EVALUATION COMPLEXITY: Moderate  PLAN: PT FREQUENCY: 2x/week  PT DURATION: 12 weeks  PLANNED INTERVENTIONS: Therapeutic exercises, Therapeutic activity, Neuromuscular re-education, Balance training, Gait training, Patient/Family education, Joint mobilization, Stair training, Vestibular training, Canalith repositioning, Orthotic/Fit training, DME instructions, Cognitive remediation, Electrical stimulation, Cryotherapy, Moist heat, Splintting, Taping, Vasopneumatic device, Traction, Ultrasound, Biofeedback, and Manual therapy  PLAN FOR NEXT SESSION: Update HEP and DC until more visit are available  10:55 AM, 12/07/21 AEtta Grandchild PT, DPT Physical Therapist - [phone]    12/07/21, 10:55 AM

## 2021-12-12 ENCOUNTER — Encounter: Payer: Managed Care, Other (non HMO) | Admitting: Speech Pathology

## 2021-12-12 ENCOUNTER — Ambulatory Visit: Payer: Medicaid Other

## 2021-12-14 ENCOUNTER — Ambulatory Visit: Payer: Medicaid Other

## 2021-12-14 DIAGNOSIS — R2681 Unsteadiness on feet: Secondary | ICD-10-CM

## 2021-12-14 DIAGNOSIS — R262 Difficulty in walking, not elsewhere classified: Secondary | ICD-10-CM

## 2021-12-14 DIAGNOSIS — M6281 Muscle weakness (generalized): Secondary | ICD-10-CM | POA: Diagnosis not present

## 2021-12-14 DIAGNOSIS — R278 Other lack of coordination: Secondary | ICD-10-CM

## 2021-12-14 NOTE — Therapy (Signed)
OUTPATIENT PHYSICAL THERAPY NEURO TREATMENT NOTE/DISCHARGE    Patient Name: Cheyenne Martinez MRN: [id number] DOB:[birth year], 46 y.o., female Today's Date: 12/14/2021   PCP: Anabel Bene MD REFERRING PROVIDER: Anabel Bene MD   PT End of Session - 12/14/21 1646     Visit Number 18    Number of Visits 35    Date for PT Re-Evaluation 01/05/22    Authorization Type [place] Medicaid [employer]: 08/01/21-12/31/21 (16 visits)    Authorization Time Period Cert 05/09/79/19/1/47    Authorization - Visit Number 16    Authorization - Number of Visits 16    Progress Note Due on Visit 20    PT Start Time 8295    PT Stop Time 1229    PT Time Calculation (min) 40 min    Equipment Utilized During Treatment Gait belt    Activity Tolerance Patient tolerated treatment well;No increased pain;Patient limited by fatigue    Behavior During Therapy [facility] for tasks assessed/performed               History reviewed. No pertinent past medical history. History reviewed. No pertinent surgical history. Patient Active Problem List   Diagnosis Date Noted   CVA (cerebral vascular accident) ([place]) 07/01/2021   New onset type 2 diabetes mellitus ([place]) 07/01/2021   Dyslipidemia 07/01/2021   Hypertensive urgency 07/01/2021     ONSET DATE: 07/01/21  REFERRING DIAG: CVA  THERAPY DIAG:  Other lack of coordination  Difficulty in walking, not elsewhere classified  Unsteadiness on feet  Muscle weakness (generalized)  Rationale for Evaluation and Treatment Rehabilitation  SUBJECTIVE:                                                                                                                                                                                              SUBJECTIVE STATEMENT:  Pt reports no falls/stumbles. Pt reports doing HEP. Reports no pain. Pt knows today is last PT session due to insurance cap.   Pt accompanied by: family member, mother Vaughan Basta  PERTINENT HISTORY: Patient is a 46  year old female who presented to ER with AMS on 07/01/21; MRI showed early subacute left PCA distribution infarct with ischemic changes within the left frontotemporal and right parietal lobe. Associated mild petechial blood products at the left occipital lobe without frank hemorrhagic transformation or significant mass effect. 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 Patient also has new diagnosis of hypertension and diabetes. Per patient's evaluation with OT. Patient's daughter is now living with patient s/p stroke. Patient is very aphasic.  Patient has had one fall.  Patient was working at [employer] and independent prior to stroke. Patient walks without AFO but is not using any DME despite significant unsteadiness.    PAIN:  Are you having pain? No  PRECAUTIONS: Fall  WEIGHT BEARING RESTRICTIONS: No  PLOF: Independent  PATIENT GOALS: to get stronger and use RLE   OBJECTIVE:  The following measure were taken from previous session notes unless otherwise noted.  DIAGNOSTIC FINDINGS: MRI showed early subacute left PCA distribution infarct with ischemic changes within the left frontotemporal and right parietal lobe. Associated mild petechial blood products at the left occipital lobe without frank hemorrhagic transformation or significant mass effect. 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  COGNITION: Overall cognitive status: Impaired   SENSATION: Light touch: Impaired  on LLE  COORDINATION: Impaired on LLE, limited coordination and spatial awareness.    TODAY'S TREATMENT:  TherAct- Instructed pt in and reviewed progressive walking program, including benefits. Reviewed and issued advanced HEP (see below)- pt performed each intervention listed in HEP with same number of sets and reps. PT discussed with pt d/c recommendations and instructions on how to  maintain and continue gains until pt can restart therapy.   TherEx- Amb. For endurance 2x148 ft with close CGA. PT recommends to pt and her mother pt use SPC for amb over uneven surfaces such as outside. Pt and her mother verbalize understanding.   PATIENT EDUCATION: Education details: discharge recommendations, reviewed HEP to maintain gains beyond PT Person educated: Patient and Caregiver Education method: Explanation, Demonstration, Tactile cues, and Verbal cues Education comprehension: returned demonstration, verbal cues required, tactile cues required, and needs further education   HOME EXERCISE PROGRAM:   11/15 - Access Code: [email or web address] URL: [email or web address] Date: 12/14/2021 Prepared by: Ricard Dillon  Exercises - Standing Hip Abduction with Counter Support  - 1 x daily - 5 x weekly - 3 sets - 10 reps - Standing March with Counter Support  - 1 x daily - 5 x weekly - 2 sets - 20 reps - Standing Hip Extension with Chair  - 1 x daily - 5 x weekly - 3 sets - 10 reps - Sit to Stand with Counter Support  - 1 x daily - 5 x weekly - 2 sets - 10 reps     08/18/21:  Access Code: [email or web address] URL: [email or web address] Date: 07/20/2021 Prepared by: Janna Arch  Exercises - Leg Extension  - 1 x daily - 7 x weekly - 2 sets - 10 reps - 5 hold - Seated March  - 1 x daily - 7 x weekly - 2 sets - 10 reps - 5 hold - Seated Hip Abduction  - 1 x daily - 7 x weekly - 2 sets - 10 reps - 5 hold - Seated Hip Adduction Isometrics with Ball  - 1 x daily - 7 x weekly - 2 sets - 10 reps - 5 hold   GOALS: Goals reviewed with patient? Yes  SHORT TERM GOALS: Target date: 11/24/2021  Patient will be independent in home exercise program to improve strength/mobility for better functional independence with ADLs. Baseline: 6/21: HEP given; 8/30: reports going well, to be advanced future date; 11/15: advanced for d/c, confirmed within session pt able to perform with correct  technique and safety Goal status: MET  2.  Patient will deny any falls over past 4 weeks to demonstrate improved safety awareness at home and work.  Baseline: 6/21: 1 fall; 8/30: no falls in the last 4  weeks Goal status: MET    LONG TERM GOALS: Target date: 01/05/2022   Patient will increase FOTO score to equal to or greater than  68   to demonstrate statistically significant improvement in mobility and quality of life.  Baseline: 6/21: 49; 8/30: 65; 11/8: 65 Goal status: Progressing   2.   Patient (< 92 years old) will complete five times sit to stand test in < 10 seconds indicating an increased LE strength and improved balance Baseline: 6/21: 20 seconds with heavy LUE support; 8/30: 20 sec with LUE support, not heavy weightbearing through UE; 11/8: 17.7sec Goal status: Progressing   3.  Patient will increase 10 meter walk test to >1.74ms as to improve gait speed for better community ambulation and to reduce fall risk. Baseline: 6/21 : 26 seconds with 3 LOB (0.38 m/s) ; 8/30:0.22 m/s with QC, no LOB; 12/07/21:  Goal status: Progressing   4.  Patient will increase BLE gross strength to 4+/5 as to improve functional strength for independent gait, increased standing tolerance and increased ADL ability. Baseline: 6/21: see note; 8/30: 4+/5 LLE, 4-/5 RLE Goal status: Progressing    ASSESSMENT:  CLINICAL IMPRESSION: HEP reviewed and advanced for discharge session so pt can maintain gains beyond therapy. Pt confirmed in session to perform these interventions with correct and safe technique. Goals were not reassessed today as they were last session. Over course of PT, pt has decreased her fall risk, and improved her gait speed/ability, strength and general functional mobility. Pt has not yet achieved her maximum functional potential, however, and still exhibits deficits that place her at an increased risk for future fall. Unfortunately, pt to be discharged on this date due to reaching visit  cap per her insurance. The pt would still benefit from future PT to improve remaining deficits to maximize functional mobility and decrease fall risk, however, is discharged at this time.  OBJECTIVE IMPAIRMENTS Abnormal gait, decreased activity tolerance, decreased balance, decreased cognition, decreased coordination, decreased endurance, decreased knowledge of condition, decreased knowledge of use of DME, decreased mobility, difficulty walking, decreased strength, decreased safety awareness, and increased fascial restrictions.   ACTIVITY LIMITATIONS carrying, lifting, bending, standing, squatting, stairs, transfers, hygiene/grooming, locomotion level, and caring for others  PARTICIPATION LIMITATIONS: cleaning, laundry, interpersonal relationship, driving, shopping, community activity, occupation, yard work, and church  PERSONAL FACTORS Age, Behavior pattern, Education, Past/current experiences, Social background, Time since onset of injury/illness/exacerbation, Transportation, and 3+ comorbidities: CVA, HTN, dyslipidemia, DM type II  are also affecting patient's functional outcome.   REHAB POTENTIAL: Fair    CLINICAL DECISION MAKING: Evolving/moderate complexity  EVALUATION COMPLEXITY: Moderate  PLAN: PT FREQUENCY: 2x/week  PT DURATION: 12 weeks  PLANNED INTERVENTIONS: Therapeutic exercises, Therapeutic activity, Neuromuscular re-education, Balance training, Gait training, Patient/Family education, Joint mobilization, Stair training, Vestibular training, Canalith repositioning, Orthotic/Fit training, DME instructions, Cognitive remediation, Electrical stimulation, Cryotherapy, Moist heat, Splintting, Taping, Vasopneumatic device, Traction, Ultrasound, Biofeedback, and Manual therapy  PLAN FOR NEXT SESSION: d/c  4:55 PM, 12/14/21 HRicard DillonPT, DPT  Physical Therapist - [phone]    12/14/21,  4:55 PM

## 2021-12-19 ENCOUNTER — Ambulatory Visit: Payer: Medicaid Other

## 2021-12-19 ENCOUNTER — Encounter: Payer: Managed Care, Other (non HMO) | Admitting: Speech Pathology

## 2021-12-21 ENCOUNTER — Ambulatory Visit: Payer: Medicaid Other

## 2021-12-21 ENCOUNTER — Ambulatory Visit: Payer: Medicaid Other | Admitting: Occupational Therapy

## 2021-12-26 ENCOUNTER — Encounter: Payer: Managed Care, Other (non HMO) | Admitting: Speech Pathology

## 2021-12-26 ENCOUNTER — Ambulatory Visit: Payer: Medicaid Other

## 2021-12-28 ENCOUNTER — Ambulatory Visit: Payer: Medicaid Other

## 2021-12-28 ENCOUNTER — Ambulatory Visit: Payer: Medicaid Other | Admitting: Occupational Therapy

## 2022-01-02 ENCOUNTER — Ambulatory Visit: Payer: Medicaid Other

## 2022-01-02 ENCOUNTER — Encounter: Payer: Managed Care, Other (non HMO) | Admitting: Speech Pathology

## 2022-01-04 ENCOUNTER — Ambulatory Visit: Payer: Medicaid Other

## 2022-01-04 ENCOUNTER — Ambulatory Visit: Payer: Medicaid Other | Admitting: Physical Therapy

## 2022-01-05 ENCOUNTER — Ambulatory Visit: Payer: Medicaid Other | Attending: Neurology | Admitting: Speech Pathology

## 2022-01-05 DIAGNOSIS — R4701 Aphasia: Secondary | ICD-10-CM | POA: Insufficient documentation

## 2022-01-05 DIAGNOSIS — R482 Apraxia: Secondary | ICD-10-CM | POA: Insufficient documentation

## 2022-01-09 ENCOUNTER — Ambulatory Visit: Payer: Medicaid Other

## 2022-01-09 ENCOUNTER — Encounter: Payer: Managed Care, Other (non HMO) | Admitting: Speech Pathology

## 2022-01-11 ENCOUNTER — Ambulatory Visit: Payer: Medicaid Other

## 2022-01-11 ENCOUNTER — Ambulatory Visit: Payer: Medicaid Other | Admitting: Occupational Therapy

## 2022-01-12 ENCOUNTER — Encounter: Payer: Medicaid Other | Admitting: Speech Pathology

## 2022-01-12 NOTE — Therapy (Signed)
OUTPATIENT SPEECH LANGUAGE PATHOLOGY TREATMENT NOTE AND RECERTIFICATION   Patient Name: Cheyenne Martinez MRN: [id number] DOB:06-17-1975, 46 y.o., female Today's Date: 01/05/2022  PCP: No PCP REFERRING PROVIDER: Gurney Maxin, MD  End of Session - 01/12/22 1609     Visit Number 16   Number of Visits 25    Date for SLP Re-Evaluation 04/05/22    Authorization Type [place] Medicaid [employer] [place] - Number of Visits 17    Progress Note Due on Visit 20    SLP Start Time 1000    SLP Stop Time  1100    SLP Time Calculation (min) 60 min                  No past medical history on file. No past surgical history on file. 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 Evolving early subacute left PCA distribution infarct as above,  with additional scattered small volume ischemic changes within the  left frontotemporal and right parietal lobe as above. Associated  mild petechial blood products at the left occipital lobe without  frank hemorrhagic transformation or significant mass effect.  2. Underlying mild chronic microvascular ischemic disease with  chronic right cerebellar infarct.    07/04/2021 CTA HEAD NECK W WO CONTRAST  IMPRESSION:  Evolving recent infarcts as seen on recent MRI. There is likely mild  petechial  hemorrhage associated with the left parieto-occipital  infarct. No significant mass effect.  Plaque at the left greater than right ICA origins without  hemodynamically significant stenosis. Marked stenosis at the right  vertebral origin without apparent flow limitation.  Intracranial atherosclerosis involving anterior and posterior  circulations. No proximal intracranial vessel occlusion.    TODAY'S TREATMENT: Focused session on building rapport and facilitating information exchange using multimodal communication. Patient has visual deficits but was able to see written keywords on dry erase board with dark marker, white background. SLP wrote pt's responses when answering personally relevant questions about her hobbies and family. Pt was receptive to receiving feedback on errors via whiteboard, and was able to correct numbers in writing when provided the opportunity. Education provided on aphasia and proposed therapy goals, as well as use of visual supports which may become more accessible to pt after upcoming cataract surgery.   PATIENT EDUCATION: Education details: what is aphasia?  Person educated: patient and her mother Education method: Explanation, Demonstration, and Verbal cues Education comprehension: needs further education   GOALS: Goals reviewed with patient? YES  SHORT TERM GOALS: Target date: 10 sessions  Given maximal multimodal cues, pt will increase speech approximation by repeating rote conversational phrases with 75% intelligibility. Baseline: unintelligible despite maximal cues 10/06/2021 Goal status: 50%; ongoing 10/27/2021: Goal status: NOT MET 2.  Pt will use a SGD to answer simple biographical questions at 50% accuracy given frequent maximal verbal and maximal visual cues. Baseline: no accuracy 10/06/2021  Goal status: discontinued, pt uninterested in using SGD  3.  Given maximal multimodal cues, pt will follow 1-step directions with 50% accuracy.  Baseline:  previous goal discontinued; 0% accuracy 10/06/2021 Goal status: INITIAL 10/27/2021: Goal status: NOT MET  4. Pt will demonstrate awareness of communication breakdown with conversation supports in structured task (word/phrase level) using facial expression, gesture, or attempting repair 75% of opportunities. Baseline: unable  Goal status: INITIAL    LONG TERM GOALS: Target date: 04/04/2021  Using multimodal communication, pt will express basic wants and needs in 8 out of 10 opportunities.  Baseline: unable Goal status: NOT MET  2.  Using multimodal communication, pt will answer basic questions in 8 out of 10 opportunities.  Baseline: unable Goal status: NOT MET  3. The patient will name 5 common household objects at 50% accuracy given frequent maximum verbal and frequent maximum phonemic cues.  Baseline: unable  Goal status: NOT MET  4. Given maximal multimodal cues, pt will follow 1-step directions with 60% accuracy.  Baseline: unable  Goal status: NOT MET 5. Pt will demonstrate awareness of communication breakdown (with communication partner support) in simple conversation using facial expression, gesture, or attempting repair 75% of opportunities. Baseline: unable  Goal status: INITIAL   ASSESSMENT:  CLINICAL IMPRESSION: Pt continues with moderate global aphasia that is c/b moderate expressive and receptive deficits. Reduced awareness of errors has led to frustration during previous therapy sessions, however after break from therapy pt appears motivated to improve and was receptive today to use of written keywords to promote error awareness. Patient was participatory during session and expressed enthusiasm when discussing therapy goal to improve ability to repair communication breakdowns to decrease her overall frustration. I recommend skilled ST for aphasia with focus on promoting functional communication, improve social interactions and decrease pt frustration. Recertification  completed today with new goals added.   OBJECTIVE IMPAIRMENTS include expressive language, receptive language, aphasia, and apraxia. These impairments are limiting patient from effectively communicating at home and in community.Factors affecting potential to achieve goals and functional outcome are ability to learn/carryover information, cooperation/participation level, severity of impairments, financial resources, and family/community support. Patient will benefit from skilled SLP services to address above impairments and improve overall function.  REHAB POTENTIAL: Good  PLAN: SLP FREQUENCY: 1x/week  SLP DURATION: 12 weeks  PLANNED INTERVENTIONS: Language facilitation, Environmental controls, Cueing hierachy, Functional tasks, Multimodal communication approach, SLP instruction and feedback, Compensatory strategies, and Patient/family education    Deneise Lever, MS, Actor [phone]

## 2022-01-16 ENCOUNTER — Encounter: Payer: Managed Care, Other (non HMO) | Admitting: Speech Pathology

## 2022-01-16 ENCOUNTER — Ambulatory Visit: Payer: Medicaid Other

## 2022-01-16 NOTE — Addendum Note (Signed)
Addended by: Arlana Lindau on: 01/16/2022 08:48 AM   Modules accepted: Orders

## 2022-01-18 ENCOUNTER — Ambulatory Visit: Payer: Medicaid Other | Admitting: Speech Pathology

## 2022-01-18 ENCOUNTER — Ambulatory Visit: Payer: Medicaid Other | Admitting: Occupational Therapy

## 2022-01-18 ENCOUNTER — Ambulatory Visit: Payer: Medicaid Other | Admitting: Physical Therapy

## 2022-01-18 DIAGNOSIS — R4701 Aphasia: Secondary | ICD-10-CM | POA: Diagnosis not present

## 2022-01-18 DIAGNOSIS — R482 Apraxia: Secondary | ICD-10-CM

## 2022-01-18 NOTE — Patient Instructions (Signed)
There are many ways to communicate!  Speak Write Describe Act it Out Draw  Today we practiced drawing. You can play a game with your family.  Ask them to tell you something to draw (objects are easy... like pieces of furniture, fruits, etc)  Practice drawing the object.  Then, see if you can say a few words to describe the object. See example below:

## 2022-01-18 NOTE — Therapy (Signed)
OUTPATIENT SPEECH LANGUAGE PATHOLOGY TREATMENT NOTE AND RECERTIFICATION   Patient Name: Cheyenne Martinez ANALYAH Martinez MRN: [id number] DOB:01/15/1976, 46 y.o., female Today's Date: 01/18/2022   PCP: No PCP REFERRING PROVIDER: Gurney Maxin, MD  End of Session - 01/18/22 2027     Visit Number 17    Number of Visits 25    Date for SLP Re-Evaluation 04/05/22    Authorization Type [place] Medicaid Prepaid [place] Time Period 4 visits approved 01/05/22-02/18/22    Authorization - Visit Number 2    Authorization - Number of Visits 4    Progress Note Due on Visit 20    SLP Start Time 1000    SLP Stop Time  1100    SLP Time Calculation (min) 60 min    Activity Tolerance Patient tolerated treatment well                    No past medical history on file. No past surgical history on file. 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 Evolving early subacute left PCA distribution infarct as above,  with additional scattered small volume ischemic changes within the  left frontotemporal and right parietal lobe as above. Associated  mild petechial blood products at the left occipital lobe without  frank hemorrhagic transformation or significant mass effect.  2. Underlying mild chronic microvascular  ischemic disease with  chronic right cerebellar infarct.    07/04/2021 CTA HEAD NECK W WO CONTRAST  IMPRESSION:  Evolving recent infarcts as seen on recent MRI. There is likely mild  petechial hemorrhage associated with the left parieto-occipital  infarct. No significant mass effect.  Plaque at the left greater than right ICA origins without  hemodynamically significant stenosis. Marked stenosis at the right  vertebral origin without apparent flow limitation.  Intracranial atherosclerosis involving anterior and posterior  circulations. No proximal intracranial vessel occlusion.    TODAY'S TREATMENT: Continued training in multimodal communication. Pt reports improvement in vision since cataract surgery. She has second surgery planned in January. SLP facilitated communication using white board; and encouraged/trained use of strategies including writing, gestures, description, and drawing. Initially pt required mod-max cues to understand drawing task, however with modeling, she increased accuracy for drawing simple objects (60% recognizable with context). Pt generated 1-2 descriptions (features, function) with mod cues.    PATIENT EDUCATION: Education details: what is aphasia?  Person educated: patient and her mother Education method: Explanation, Demonstration, and Verbal cues Education comprehension: needs further education   GOALS: Goals reviewed with patient? YES  SHORT TERM GOALS: Target date: 10 sessions  Given maximal multimodal cues, pt will increase speech approximation by repeating rote conversational phrases with 75% intelligibility. Baseline: unintelligible despite maximal cues 10/06/2021 Goal status: 50%; ongoing 10/27/2021: Goal status: NOT MET 2.  Pt will use a SGD to answer simple biographical questions at  50% accuracy given frequent maximal verbal and maximal visual cues. Baseline: no accuracy 10/06/2021 Goal status: discontinued, pt uninterested in using SGD  3.   Given maximal multimodal cues, pt will follow 1-step directions with 50% accuracy.  Baseline: previous goal discontinued; 0% accuracy 10/06/2021 Goal status: INITIAL 10/27/2021: Goal status: NOT MET  4. Pt will demonstrate awareness of communication breakdown with conversation supports in structured task (word/phrase level) using facial expression, gesture, or attempting repair 75% of opportunities. Baseline: unable  Goal status: INITIAL    LONG TERM GOALS: Target date: 04/04/2021  Using multimodal communication, pt will express basic wants and needs in 8 out of 10 opportunities.  Baseline: unable Goal status: NOT MET  2.  Using multimodal communication, pt will answer basic questions in 8 out of 10 opportunities.  Baseline: unable Goal status: NOT MET  3. The patient will name 5 common household objects at 50% accuracy given frequent maximum verbal and frequent maximum phonemic cues.  Baseline: unable  Goal status: NOT MET  4. Given maximal multimodal cues, pt will follow 1-step directions with 60% accuracy.  Baseline: unable  Goal status: NOT MET 5. Pt will demonstrate awareness of communication breakdown (with communication partner support) in simple conversation using facial expression, gesture, or attempting repair 75% of opportunities. Baseline: unable  Goal status: INITIAL   ASSESSMENT:  CLINICAL IMPRESSION: Pt continues with moderate aphasia that is c/b moderate expressive and receptive deficits. Reduced awareness of errors has led to frustration during previous therapy sessions, however after break from therapy pt appears motivated to improve and remains engaged throughout session.  She was receptive today to use of written keywords to promote error awareness, and willing to use new strategies such as drawing to enhance her communication. I recommend skilled ST for aphasia with focus on promoting functional communication, improve social interactions and decrease pt  frustration.    OBJECTIVE IMPAIRMENTS include expressive language, receptive language, aphasia, and apraxia. These impairments are limiting patient from effectively communicating at home and in community.Factors affecting potential to achieve goals and functional outcome are ability to learn/carryover information, cooperation/participation level, severity of impairments, financial resources, and family/community support. Patient will benefit from skilled SLP services to address above impairments and improve overall function.  REHAB POTENTIAL: Good  PLAN: SLP FREQUENCY: 1x/week  SLP DURATION: 12 weeks  PLANNED INTERVENTIONS: Language facilitation, Environmental controls, Cueing hierachy, Functional tasks, Multimodal communication approach, SLP instruction and feedback, Compensatory strategies, and Patient/family education    Deneise Lever, MS, Actor [phone]

## 2022-01-25 ENCOUNTER — Ambulatory Visit: Payer: Medicaid Other | Admitting: Physical Therapy

## 2022-02-01 ENCOUNTER — Ambulatory Visit: Payer: Medicaid Other | Attending: Neurology | Admitting: Speech Pathology

## 2022-02-01 ENCOUNTER — Ambulatory Visit: Payer: Medicaid Other | Admitting: Physical Therapy

## 2022-02-01 DIAGNOSIS — R482 Apraxia: Secondary | ICD-10-CM | POA: Insufficient documentation

## 2022-02-01 DIAGNOSIS — R4701 Aphasia: Secondary | ICD-10-CM | POA: Diagnosis present

## 2022-02-01 NOTE — Therapy (Unsigned)
OUTPATIENT SPEECH LANGUAGE PATHOLOGY TREATMENT NOTE   Patient Name: Cheyenne Martinez IDROVO MRN: [id number] DOB:1975/02/18, 47 y.o., female Today's Date: 02/01/2022   PCP: No PCP REFERRING PROVIDER: Gurney Maxin, MD  End of Session - 02/01/22 1731     Visit Number 18    Number of Visits 25    Date for SLP Re-Evaluation 04/05/22    Authorization Type [place] Medicaid Prepaid [place] Time Period 4 visits approved 01/05/22-02/18/22    Authorization - Visit Number 3    Authorization - Number of Visits 4    Progress Note Due on Visit 20    SLP Start Time 0900    SLP Stop Time  1000    SLP Time Calculation (min) 60 min    Activity Tolerance Patient tolerated treatment well                    No past medical history on file. No past surgical history on file. 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 Evolving early subacute left PCA distribution infarct as above,  with additional scattered small volume ischemic changes within the  left frontotemporal and right parietal lobe as above. Associated  mild petechial blood products at the left occipital lobe without  frank hemorrhagic transformation or significant mass effect.  2. Underlying mild chronic microvascular ischemic disease with  chronic right  cerebellar infarct.    07/04/2021 CTA HEAD NECK W WO CONTRAST  IMPRESSION:  Evolving recent infarcts as seen on recent MRI. There is likely mild  petechial hemorrhage associated with the left parieto-occipital  infarct. No significant mass effect.  Plaque at the left greater than right ICA origins without  hemodynamically significant stenosis. Marked stenosis at the right  vertebral origin without apparent flow limitation.  Intracranial atherosclerosis involving anterior and posterior  circulations. No proximal intracranial vessel occlusion.    TODAY'S TREATMENT: Continued training in multimodal communication. ***   PATIENT EDUCATION: Education details: communication modalities Person educated: patient and her mother Education method: Explanation, Demonstration, and Verbal cues Education comprehension: needs further education   GOALS: Goals reviewed with patient? YES  SHORT TERM GOALS: Target date: 10 sessions  Given maximal multimodal cues, pt will increase speech approximation by repeating rote conversational phrases with 75% intelligibility. Baseline: unintelligible despite maximal cues 10/06/2021 Goal status: 50%; ongoing 10/27/2021: Goal status: NOT MET 2.  Pt will use a SGD to answer simple biographical questions at 50% accuracy given frequent maximal verbal and maximal visual cues. Baseline: no accuracy 10/06/2021 Goal status: discontinued, pt uninterested in using SGD  3.  Given maximal multimodal cues, pt will follow 1-step directions with 50% accuracy.  Baseline: previous goal discontinued; 0% accuracy 10/06/2021 Goal status: INITIAL 10/27/2021: Goal status: NOT MET  4. Pt will demonstrate awareness of communication breakdown with conversation supports in structured task (word/phrase level) using  facial expression, gesture, or attempting repair 75% of opportunities. Baseline: unable  Goal status: INITIAL    LONG TERM GOALS: Target date: 04/04/2021  Using  multimodal communication, pt will express basic wants and needs in 8 out of 10 opportunities.  Baseline: unable Goal status: NOT MET  2.  Using multimodal communication, pt will answer basic questions in 8 out of 10 opportunities.  Baseline: unable Goal status: NOT MET  3. The patient will name 5 common household objects at 50% accuracy given frequent maximum verbal and frequent maximum phonemic cues.  Baseline: unable  Goal status: NOT MET  4. Given maximal multimodal cues, pt will follow 1-step directions with 60% accuracy.  Baseline: unable  Goal status: NOT MET 5. Pt will demonstrate awareness of communication breakdown (with communication partner support) in simple conversation using facial expression, gesture, or attempting repair 75% of opportunities. Baseline: unable  Goal status: INITIAL   ASSESSMENT:  CLINICAL IMPRESSION: Pt continues with moderate aphasia that is c/b expressive and receptive deficits. Reduced awareness of errors has led to frustration during previous therapy sessions, however after break from therapy pt appears motivated to improve and remains engaged throughout session.  She was receptive today to use of written keywords to promote error awareness, and willing to use new strategies such as drawing to enhance her communication. Pt initiated drawing and gestures on several occasions during communication breakdowns in structured tasks. I recommend skilled ST for aphasia with focus on promoting functional communication, improve social interactions and decrease pt frustration.    OBJECTIVE IMPAIRMENTS include expressive language, receptive language, aphasia, and apraxia. These impairments are limiting patient from effectively communicating at home and in community.Factors affecting potential to achieve goals and functional outcome are ability to learn/carryover information, cooperation/participation level, severity of impairments, financial resources, and  family/community support. Patient will benefit from skilled SLP services to address above impairments and improve overall function.  REHAB POTENTIAL: Good  PLAN: SLP FREQUENCY: 1x/week  SLP DURATION: 12 weeks  PLANNED INTERVENTIONS: Language facilitation, Environmental controls, Cueing hierachy, Functional tasks, Multimodal communication approach, SLP instruction and feedback, Compensatory strategies, and Patient/family education    Deneise Lever, MS, Actor [phone]

## 2022-02-08 ENCOUNTER — Ambulatory Visit: Payer: Medicaid Other | Admitting: Physical Therapy

## 2022-02-08 ENCOUNTER — Other Ambulatory Visit: Payer: Self-pay | Admitting: Nephrology

## 2022-02-08 ENCOUNTER — Ambulatory Visit: Payer: Medicaid Other | Admitting: Speech Pathology

## 2022-02-08 DIAGNOSIS — N179 Acute kidney failure, unspecified: Secondary | ICD-10-CM

## 2022-02-15 ENCOUNTER — Ambulatory Visit: Payer: Medicaid Other | Admitting: Physical Therapy

## 2022-02-15 ENCOUNTER — Encounter: Payer: Medicaid Other | Admitting: Speech Pathology

## 2022-02-21 ENCOUNTER — Ambulatory Visit: Payer: Medicaid Other | Admitting: Speech Pathology

## 2022-02-21 DIAGNOSIS — R4701 Aphasia: Secondary | ICD-10-CM

## 2022-02-21 NOTE — Therapy (Signed)
OUTPATIENT SPEECH LANGUAGE PATHOLOGY TREATMENT NOTE   Patient Name: Cheyenne Martinez MRN: [id number] DOB:09/14/1975, 47 y.o., female Today's Date: 02/21/2022   PCP: No PCP REFERRING PROVIDER: Theora Master, MD  End of Session - 02/21/22 1233     Visit Number 19    Number of Visits 25    Date for SLP Re-Evaluation 04/05/22    Authorization Type [place] Medicaid Prepaid [employer]    Authorization Time Period 4 visits approved 01/05/22-02/18/22    Authorization - Visit Number 3    Authorization - Number of Visits 4    Progress Note Due on Visit 20    SLP Start Time 1000    SLP Stop Time  1045    SLP Time Calculation (min) 45 min    Activity Tolerance Treatment limited secondary to agitation                    No past medical history on file. No past surgical history on file. 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 Evolving early subacute left PCA distribution infarct as above,  with additional scattered small volume ischemic changes within the  left frontotemporal and right parietal lobe as above. Associated  mild petechial blood products at the left occipital lobe without  frank hemorrhagic transformation or significant mass effect.  2. Underlying mild chronic microvascular ischemic disease with  chronic right  cerebellar infarct.    07/04/2021 CTA HEAD NECK W WO CONTRAST  IMPRESSION:  Evolving recent infarcts as seen on recent MRI. There is likely mild  petechial hemorrhage associated with the left parieto-occipital  infarct. No significant mass effect.  Plaque at the left greater than right ICA origins without  hemodynamically significant stenosis. Marked stenosis at the right  vertebral origin without apparent flow limitation.  Intracranial atherosclerosis involving anterior and posterior  circulations. No proximal intracranial vessel occlusion.    TODAY'S TREATMENT: Pt was seen for skilled ST intervention targeting goals for improved functional communication. Pt's mother was present during this session. Initially, pt was cooperative and participatory, but frustration and irritability seemed to increase until pt verbalized that she was "done" and wanted to leave, despite SLP's attempts to encourage her participation, changing tasks to something less challenging, and taking a break from tasks to talk about her children. Pt was able to answer yes/no questions about who her family member is. She struggled with verbalizing her mother's name and required extra time to come up with the correct name. Pt frequently tried to give up as the session progressed. Pt did complete the task of spelling the word associated with a photo with 90% accuracy. She appeared to have more difficulty with 1-3 letter words. Pt did work diligently on matching the word/phrase that matched the photo with distraction in place (SLP and mother were talking about another topic). SLP attempted to redirect by  asking about her children. Pt struggled with verbalizing their name. Pt's mother indicated she could write the names down. Ernestene Kiel, son, 54yo, Greggory Stallion, son, Janeece Fitting, and Newton Pigg, daughter, 47yo). Pt struggled with spelling names correctly, and was unable to provide an age. Pts mother facilitated completion of this task.    Continued  training in multimodal communication and repair of communication breakdowns. Pt named objects (approximations) and responded to errors/perseverations when written by reattempting with occasional min-mod cues (necessary ~30 % of the time). When reattempts were incorrect, patient initiated (reached for marker) drawing on 3 occasions, and answered questions re: function/physical description/location with occasional min-mod cues. Level 1 spelling (3 letter words, choice from F:3) 80% acc.    PATIENT EDUCATION: Education details: communication modalities Person educated: patient and her mother Education method: Explanation, Demonstration, and Verbal cues Education comprehension: needs further education   GOALS: Goals reviewed with patient? YES  SHORT TERM GOALS: Target date: 10 sessions  Given maximal multimodal cues, pt will increase speech approximation by repeating rote conversational phrases with 75% intelligibility. Baseline: unintelligible despite maximal cues 10/06/2021 Goal status: 50%; ongoing 10/27/2021: Goal status: NOT MET 2.  Pt will use a SGD to answer simple biographical questions at 50% accuracy given frequent maximal verbal and maximal visual cues. Baseline: no accuracy 10/06/2021 Goal status: discontinued, pt uninterested in using SGD  3.  Given maximal multimodal cues, pt will follow 1-step directions with 50% accuracy.  Baseline: previous goal discontinued; 0% accuracy 10/06/2021 Goal status: INITIAL 10/27/2021: Goal status: NOT MET  4. Pt will demonstrate awareness of communication breakdown with conversation supports in structured task (word/phrase level) using facial expression, gesture, or attempting repair 75% of opportunities. Baseline: unable  Goal status: INITIAL    LONG TERM GOALS: Target date: 04/04/2021  Using multimodal communication, pt will express basic wants and needs in 8 out of 10 opportunities.  Baseline: unable Goal status: NOT MET  2.  Using  multimodal communication, pt will answer basic questions in 8 out of 10 opportunities.  Baseline: unable Goal status: NOT MET  3. The patient will name 5 common household objects at 50% accuracy given frequent maximum verbal and frequent maximum phonemic cues.  Baseline: unable  Goal status: NOT MET  4. Given maximal multimodal cues, pt will follow 1-step directions with 60% accuracy.  Baseline: unable  Goal status: NOT MET 5. Pt will demonstrate awareness of communication breakdown (with communication partner support) in simple conversation using facial expression, gesture, or attempting repair 75% of opportunities. Baseline: unable  Goal status: INITIAL   ASSESSMENT:  CLINICAL IMPRESSION: Pt continues with moderate aphasia that is c/b expressive and receptive deficits. Reduced awareness of errors has led to frustration during previous therapy sessions, however after break from therapy pt appears motivated to improve and remains engaged throughout session.  She was receptive today to use of written keywords to promote error awareness, and willing to use new strategies such as drawing to enhance her communication. Pt initiated drawing and gestures on several occasions during communication breakdowns in structured tasks. I recommend skilled ST for aphasia with focus on promoting functional communication, improve social interactions and decrease pt frustration.    OBJECTIVE IMPAIRMENTS include expressive language, receptive language, aphasia, and apraxia. These impairments are limiting patient from effectively communicating at home and in community.Factors affecting potential to achieve goals and functional outcome are ability to learn/carryover information, cooperation/participation level, severity of impairments, financial resources, and family/community support. Patient will benefit from skilled SLP services to address above impairments  and improve overall function.  REHAB POTENTIAL:  Good  PLAN: SLP FREQUENCY: 1x/week  SLP DURATION: 12 weeks  PLANNED INTERVENTIONS: Language facilitation, Environmental controls, Cueing hierachy, Functional tasks, Multimodal communication approach, SLP instruction and feedback, Compensatory strategies, and Patient/family education   [place] B. Quentin Ore, [facility], CCC-SLP Speech Language Pathologist   Shonna Chock, [place] 02/16/2022, 2:18 PM       [place] [facility]IN [facility] SERVICES [address] [place], [place], [address] Phone: [phone]   Fax:  [phone]

## 2022-02-22 ENCOUNTER — Ambulatory Visit: Payer: Medicaid Other | Admitting: Physical Therapy

## 2022-02-27 ENCOUNTER — Ambulatory Visit
Admission: RE | Admit: 2022-02-27 | Discharge: 2022-02-27 | Disposition: A | Discharge status: Home / Self Care | Payer: Medicaid Other | Source: Ambulatory Visit | Attending: Nephrology | Admitting: Nephrology

## 2022-02-27 DIAGNOSIS — N179 Acute kidney failure, unspecified: Secondary | ICD-10-CM | POA: Insufficient documentation

## 2022-03-01 ENCOUNTER — Ambulatory Visit: Payer: Medicaid Other | Admitting: Speech Pathology

## 2022-03-01 ENCOUNTER — Ambulatory Visit: Payer: Medicaid Other | Admitting: Physical Therapy

## 2022-03-06 ENCOUNTER — Ambulatory Visit: Payer: Medicaid Other | Admitting: Speech Pathology

## 2022-03-08 ENCOUNTER — Ambulatory Visit: Payer: Medicaid Other | Admitting: Physical Therapy

## 2022-03-13 ENCOUNTER — Ambulatory Visit: Payer: Medicaid Other | Admitting: Speech Pathology

## 2022-03-15 ENCOUNTER — Ambulatory Visit: Payer: Medicaid Other | Admitting: Physical Therapy

## 2022-03-20 ENCOUNTER — Ambulatory Visit: Payer: Medicaid Other | Admitting: Speech Pathology

## 2022-03-22 ENCOUNTER — Ambulatory Visit: Payer: Medicaid Other | Admitting: Physical Therapy

## 2022-03-27 ENCOUNTER — Encounter: Payer: Medicaid Other | Admitting: Speech Pathology

## 2022-03-29 ENCOUNTER — Ambulatory Visit: Payer: Medicaid Other | Admitting: Physical Therapy

## 2022-03-29 ENCOUNTER — Encounter: Payer: Medicaid Other | Admitting: Speech Pathology

## 2022-04-05 ENCOUNTER — Encounter: Payer: Medicaid Other | Admitting: Speech Pathology

## 2022-04-05 ENCOUNTER — Ambulatory Visit: Payer: Medicaid Other | Admitting: Physical Therapy

## 2022-04-12 ENCOUNTER — Encounter: Payer: Medicaid Other | Admitting: Speech Pathology

## 2022-04-12 ENCOUNTER — Ambulatory Visit: Payer: Medicaid Other | Admitting: Physical Therapy

## 2022-04-19 ENCOUNTER — Ambulatory Visit: Payer: Medicaid Other | Admitting: Physical Therapy

## 2022-04-19 ENCOUNTER — Encounter: Payer: Medicaid Other | Admitting: Speech Pathology

## 2022-04-24 ENCOUNTER — Other Ambulatory Visit: Payer: Self-pay

## 2022-04-24 ENCOUNTER — Emergency Department (HOSPITAL_COMMUNITY)
Admission: EM | Admit: 2022-04-24 | Discharge: 2022-04-24 | Disposition: A | Discharge status: Home / Self Care | Payer: Medicaid Other | Attending: Emergency Medicine | Admitting: Emergency Medicine

## 2022-04-24 ENCOUNTER — Encounter (HOSPITAL_COMMUNITY): Payer: Self-pay | Admitting: *Deleted

## 2022-04-24 DIAGNOSIS — K5900 Constipation, unspecified: Secondary | ICD-10-CM | POA: Diagnosis not present

## 2022-04-24 DIAGNOSIS — Z8673 Personal history of transient ischemic attack (TIA), and cerebral infarction without residual deficits: Secondary | ICD-10-CM | POA: Insufficient documentation

## 2022-04-24 DIAGNOSIS — Z7982 Long term (current) use of aspirin: Secondary | ICD-10-CM | POA: Insufficient documentation

## 2022-04-24 HISTORY — DX: Type 2 diabetes mellitus without complications: E11.9

## 2022-04-24 HISTORY — DX: Essential (primary) hypertension: I10

## 2022-04-24 HISTORY — DX: Cerebral infarction, unspecified: I63.9

## 2022-04-24 MED ORDER — SENNA 8.6 MG PO TABS: 2.0000 | ORAL_TABLET | Freq: Every day | ORAL | 0 refills | Status: AC

## 2022-04-24 MED ORDER — POLYETHYLENE GLYCOL 3350 17 GM/SCOOP PO POWD: 1.0000 | Freq: Once | ORAL | 0 refills | Status: AC

## 2022-04-24 NOTE — Discharge Instructions (Addendum)
You are seen in the emergency department for constipation. Given that this has only been present for an estimated two days, I would recommend trying at home interventions to gain relief such as Miralax and Senna. These medications have been sent to your pharmacy. Please take these as prescribed.

## 2022-04-24 NOTE — ED Triage Notes (Signed)
Patient has a history of cva x two , it is very difficult to get her words out. However daughter is at the bedside. And states patient had been using OTC medication without relief. LBM was 3/21

## 2022-04-24 NOTE — ED Provider Notes (Signed)
[facility] Provider Note   CSN: [id number] Arrival date & time: 04/24/22  [id number]     History Chief Complaint  Patient presents with   Constipation    Cheyenne Martinez is a 47 y.o. female.  Patient with past history significant for CVA x 2 presents to the emergency department with complaints of constipation.  Due to prior CVAs, patient has difficulty communicating complaint specifically but daughter is at bedside which explains that patient has been experiencing constipation for 2 days.  Patient has a list of most recent bowel movements and last bowel movement was reported to be 3/23 in the morning.  Patient is taking Ex-Lax to help improve bowel movements and reports that she has not taken 1 in the last few days prior to see if she can have a bowel movement without any medications.  Patient did report to me that she takes a masticating supplement due to difficulty eating and swallowing.  Denies any significant abdominal pain, nausea, vomiting, diarrhea, hematemesis, hematochezia. No prior history of an GI abdnormalities such as IBS, Crohn's disease, ulcerative colitis, or GI cancers.   Constipation      Home Medications Prior to Admission medications   Medication Sig Start Date End Date Taking? Authorizing Provider  polyethylene glycol powder (GLYCOLAX/MIRALAX) 17 GM/SCOOP powder Take 255 g by mouth once for 1 dose. 04/24/22 04/24/22 Yes Luvenia Heller, PA-C  senna (SENOKOT) 8.6 MG TABS tablet Take 2 tablets (17.2 mg total) by mouth daily. 04/24/22  Yes Luvenia Heller, PA-C  aspirin EC 81 MG tablet Take 1 tablet (81 mg total) by mouth daily. Swallow whole. 07/04/21   Lorella Nimrod, MD  atorvastatin (LIPITOR) 40 MG tablet Take 1 tablet (40 mg total) by mouth daily. 07/04/21   Lorella Nimrod, MD  blood glucose meter kit and supplies KIT Check blood sugard up to 4 times a day 07/04/21   Lorella Nimrod, MD  clopidogrel (PLAVIX) 75 MG tablet Take 1 tablet (75  mg total) by mouth daily. 07/04/21   Lorella Nimrod, MD  glucose blood test strip Test blood glucose up to 4 times a day 07/04/21   Lorella Nimrod, MD  hydrochlorothiazide (HYDRODIURIL) 12.5 MG tablet Take 0.5 tablets (6.25 mg total) by mouth daily. 07/04/21   Lorella Nimrod, MD  lisinopril (ZESTRIL) 10 MG tablet Take 1 tablet (10 mg total) by mouth daily. 07/04/21   Lorella Nimrod, MD  metFORMIN (GLUCOPHAGE) 500 MG tablet Take 1 tablet (500 mg total) by mouth 2 (two) times daily with a meal. 07/04/21   Lorella Nimrod, MD  Rightest GL300 Lancets MISC Test up to 4 times a day 07/04/21   Lorella Nimrod, MD  sitaGLIPtin (JANUVIA) 100 MG tablet Take 1 tablet (100 mg total) by mouth daily. 07/04/21   Lorella Nimrod, MD      Allergies    Patient has no known allergies.    Review of Systems   Review of Systems  Gastrointestinal:  Positive for constipation.  All other systems reviewed and are negative.   Physical Exam Updated Vital Signs BP (!) 115/97 (BP Location: Right Arm)   Pulse 63   Temp 98 F (36.7 C) (Oral)   Resp 18   Ht 5\' 6"  (1.676 m)   Wt 74.3 kg   SpO2 100%   BMI 26.44 kg/m  Physical Exam Vitals and nursing note reviewed.  Constitutional:      General: She is not in acute distress.  Appearance: She is well-developed.  HENT:     Head: Normocephalic and atraumatic.  Eyes:     Conjunctiva/sclera: Conjunctivae normal.  Cardiovascular:     Rate and Rhythm: Normal rate and regular rhythm.     Heart sounds: No murmur heard. Pulmonary:     Effort: Pulmonary effort is normal. No respiratory distress.     Breath sounds: Normal breath sounds.  Abdominal:     General: Abdomen is flat. There is no distension.     Palpations: Abdomen is soft. There is no mass.     Tenderness: There is no abdominal tenderness.  Musculoskeletal:        General: No swelling.     Cervical back: Neck supple.  Skin:    General: Skin is warm and dry.     Capillary Refill: Capillary refill takes less than 2  seconds.  Neurological:     Mental Status: She is alert.  Psychiatric:        Mood and Affect: Mood normal.     ED Results / Procedures / Treatments   Labs (all labs ordered are listed, but only abnormal results are displayed) Labs Reviewed - No data to display  EKG None  Radiology No results found.  Procedures Procedures   Medications Ordered in ED Medications - No data to display  ED Course/ Medical Decision Making/ A&P                           Medical Decision Making  This patient presents to the ED for concern of constipation.  Differential diagnosis includes IBS, cancer, constipation, bowel obstruction  Problem List / ED Course:  Patient presented to the emergency department complaints of constipation.  She reports that she has been constipated for approximately 2 days with last bowel movement that she reported being on March 23.  She has a list of her most recent bowel movement and she appears to be having bowel movements typically daily up to twice a day.  She reports in the past she has been needing to take Ex-Lax to help with her bowel movements has not tried any other over-the-counter supplements such as fiber supplementation or MiraLAX.  Given the patient is only having 2 days of bowel movement and has no significant abdominal tenderness, stable to pass gas, I believe that patient is safe to manage outpatient with over-the-counter medications such as MiraLAX and potentially a stimulant such as senna.  Encourage patient to take his medications consistently for the next 1 to 2 weeks to ensure that she has symptomatic relief but return to the emergency department if she begins to have significant abdominal pain, rectal bleeding, inability to pass gas, or significant abdominal distention.  Given the lack of prolonged constipation, lack of cancer history, and no rectal symptoms, I am not inclined to believe that patient is currently suffering from a bowel obstruction or a  gastrointestinal bleed at this time. Patient agreeable with treatment plan verbalized understanding all return precautions.  Final Clinical Impression(s) / ED Diagnoses Final diagnoses:  Constipation, unspecified constipation type    Rx / DC Orders ED Discharge Orders          Ordered    polyethylene glycol powder (GLYCOLAX/MIRALAX) 17 GM/SCOOP powder   Once        04/24/22 0926    senna (SENOKOT) 8.6 MG TABS tablet  Daily        04/24/22 0926  Luvenia Heller, PA-C 04/24/22 0931    Hayden Rasmussen, MD 04/24/22 [phone]

## 2022-04-26 ENCOUNTER — Ambulatory Visit: Payer: Medicaid Other | Admitting: Physical Therapy

## 2022-04-26 ENCOUNTER — Encounter: Payer: Medicaid Other | Admitting: Speech Pathology

## 2022-05-03 ENCOUNTER — Encounter: Payer: Medicaid Other | Admitting: Speech Pathology

## 2022-05-03 ENCOUNTER — Ambulatory Visit: Payer: Medicaid Other | Admitting: Physical Therapy

## 2022-05-10 ENCOUNTER — Encounter: Payer: Medicaid Other | Admitting: Speech Pathology

## 2022-05-10 ENCOUNTER — Ambulatory Visit: Payer: Medicaid Other | Admitting: Physical Therapy

## 2022-05-17 ENCOUNTER — Ambulatory Visit: Payer: Medicaid Other | Admitting: Physical Therapy

## 2022-05-24 ENCOUNTER — Ambulatory Visit: Payer: Medicaid Other | Admitting: Physical Therapy

## 2022-05-31 ENCOUNTER — Ambulatory Visit: Payer: Medicaid Other | Admitting: Physical Therapy

## 2022-06-07 ENCOUNTER — Ambulatory Visit: Payer: Medicaid Other | Admitting: Physical Therapy

## 2022-06-14 ENCOUNTER — Ambulatory Visit: Payer: Medicaid Other | Admitting: Physical Therapy

## 2022-06-21 ENCOUNTER — Ambulatory Visit: Payer: Medicaid Other | Admitting: Physical Therapy

## 2022-06-28 ENCOUNTER — Ambulatory Visit: Payer: Medicaid Other | Admitting: Physical Therapy

## 2022-07-05 ENCOUNTER — Ambulatory Visit: Payer: Medicaid Other | Admitting: Physical Therapy

## 2022-07-12 ENCOUNTER — Ambulatory Visit: Payer: Medicaid Other | Admitting: Physical Therapy

## 2022-07-19 ENCOUNTER — Ambulatory Visit: Payer: Medicaid Other | Admitting: Physical Therapy

## 2022-12-26 ENCOUNTER — Ambulatory Visit: Payer: Medicaid Other

## 2022-12-26 ENCOUNTER — Ambulatory Visit: Payer: Medicaid Other | Attending: Physician Assistant

## 2022-12-26 DIAGNOSIS — R262 Difficulty in walking, not elsewhere classified: Secondary | ICD-10-CM | POA: Insufficient documentation

## 2022-12-26 DIAGNOSIS — R4701 Aphasia: Secondary | ICD-10-CM | POA: Diagnosis present

## 2022-12-26 DIAGNOSIS — R41841 Cognitive communication deficit: Secondary | ICD-10-CM | POA: Insufficient documentation

## 2022-12-26 DIAGNOSIS — R278 Other lack of coordination: Secondary | ICD-10-CM | POA: Insufficient documentation

## 2022-12-26 DIAGNOSIS — M6281 Muscle weakness (generalized): Secondary | ICD-10-CM | POA: Diagnosis present

## 2022-12-26 DIAGNOSIS — R2681 Unsteadiness on feet: Secondary | ICD-10-CM | POA: Insufficient documentation

## 2022-12-26 DIAGNOSIS — I63532 Cerebral infarction due to unspecified occlusion or stenosis of left posterior cerebral artery: Secondary | ICD-10-CM | POA: Insufficient documentation

## 2022-12-26 NOTE — Therapy (Signed)
OUTPATIENT SPEECH LANGUAGE PATHOLOGY APHASIA EVALUATION   Patient Name: Cheyenne Martinez MRN: [id number] DOB:04-May-1975, 47 y.o., female Today's Date: 12/26/2022  PCP: No PCP REFERRING PROVIDER: Ignacia Bayley, PA-C   End of Session - 12/26/22 1114     Visit Number 1    Number of Visits 24    Date for SLP Re-Evaluation 03/20/23    Authorization Type [place] Medicaid [employer]aid [employer]    Authorization Time Period 11/4- 02/25/23 for 24 SLP visits    Authorization - Visit Number 1    Authorization - Number of Visits 24    Progress Note Due on Visit 20    SLP Start Time 1020    SLP Stop Time  1105    SLP Time Calculation (min) 45 min    Activity Tolerance Patient tolerated treatment well             Past Medical History:  Diagnosis Date   Diabetes mellitus without complication (HCC)    Hypertension    Stroke (HCC)    No past surgical history on file. 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    PAIN:  Are you having pain? No  FALLS: Has patient fallen in last 6 months?  No  LIVING ENVIRONMENT: Lives with: lives with their family Lives in: House/apartment  PLOF:  Level of assistance:  Independent with ADLs   PATIENT GOALS to improve communication  OBJECTIVE:   DIAGNOSTIC FINDINGS: 07/01/2021 MRI BRAIN W WO CONTRAST  IMPRESSION:  1. Evolving early subacute left PCA distribution infarct as above,  with additional scattered small volume ischemic changes within the  left frontotemporal and right parietal lobe as above. Associated  mild petechial blood products at the left occipital lobe without  frank hemorrhagic transformation or significant mass effect.  2. Underlying mild chronic microvascular ischemic disease with  chronic right cerebellar infarct.    07/04/2021 CTA HEAD NECK W WO CONTRAST  IMPRESSION:  Evolving recent infarcts as seen on recent MRI. There is likely mild  petechial hemorrhage associated with the left parieto-occipital  infarct. No significant mass effect.  Plaque at the left greater than right ICA origins without  hemodynamically significant stenosis. Marked stenosis at the right  vertebral origin without apparent flow limitation.  Intracranial atherosclerosis involving anterior and posterior  circulations. No proximal intracranial vessel occlusion.    COGNITION: Overall cognitive status: Difficulty to assess due to: Communication impairment and severity of deficits Areas of impairment:  N/A Functional deficits: N/A  AUDITORY COMPREHENSION: Overall auditory comprehension: Impaired: simple YES/NO questions: Impaired: simple Following directions: Impaired: simple Conversation: Simple Interfering components:  N/A Effective technique: repetition/stressing words and stressing words   READING COMPREHENSION: Impaired: word  EXPRESSION: verbal  VERBAL EXPRESSION: Level of generative/spontaneous verbalization: word and phrase Automatic speech: name: intact  and social response: intact  Repetition: TBA Naming: Responsive: 0-25%, Confrontation: 0-25%, and Divergent: 0-25% Pragmatics: Appears intact Interfering components:  N/A Effective technique:  N/A Non-verbal means of communication: N/A  WRITTEN EXPRESSION: Dominant hand: right  Written expression: Not tested  MOTOR SPEECH: Overall motor speech: impaired Level of impairment: Word Respiration: diaphragmatic/abdominal breathing Phonation: normal Resonance: WFL Articulation: Impaired: word Intelligibility: Intelligibility reduced minimally Motor planning: Impaired: aware and consistent Motor speech errors: aware and consistent Interfering components:  N/A Effective technique:  N/A   ORAL MOTOR EXAMINATION Overall status: Impaired:   Labial: Right (ROM, Symmetry, Strength, and Sensation) Facial: Right (ROM, Symmetry, Strength, and Sensation)   STANDARDIZED ASSESSMENTS: WAB-R   Western Aphasia Battery- Revised  Spontaneous Speech                           Information content               3/10                                            Fluency                                 4/10                                          Comprehension     Yes/No questions                 42/60                                           Auditory Word Recognition  29/60                                Sequential Commands       21/80                              Repetition                             TBA/100                                        Naming    Object Naming                     18/60                                           Word Fluency                        2/20  Sentence Completion          4/10                                             Responsive Speech              2/10                                         Aphasia Quotient                  TBD/100         Pt's severity rating to be calculated next session.     PATIENT REPORTED OUTCOME MEASURES (PROM): Communication Effectiveness Survey:     The Communication Effectiveness Survey is a patient-reported outcome measure in which the patient rates  their own effectiveness in different communication situations. A higher score indicates greater effectiveness. Pt's mother completed and rating was was 16/32.     PATIENT EDUCATION: Education details: results of this assessment, ST POC Person educated: Patient and Engineer, structural , mother Education method: Explanation, Demonstration, and Verbal cues Education comprehension: needs further education   GOALS: Goals reviewed with patient? No  SHORT TERM GOALS: Target date: 10 sessions  Pt will participate in further assessment of functional reading, writing, and repetition.  Baseline:  Goal status: INITIAL  2.  Pt will follow 1-step directions with >50% accuracy with multimodal cues. Baseline:  Goal status: INITIAL  3.  Pt will repeat single words using a speech generating device in 50% of opportunities given frequent maximal verbal and maximal visual cues. Baseline:  Goal status: INITIAL  4. With Moderate A-Maximal A, patient will perform structured naming tasks with fair-good approximations 50% of the time.   Baseline:  Goal Status: INITIAL  5. Using multimodal communication and assistance as needed, pt will answer basic biographical questions with 60% accuracy.   Baseline:    Goal Status: INITIAL     LONG TERM GOALS: Target date: 03/20/23  With Min A, patient/family will demonstrate understanding of the following concepts: aphasia, spontaneous recovery, communication vs conversation, strengths/strategies to promote success in all communication settings. Baseline:  Goal status: INITIAL  2.  Using multimodal communication, pt will answer basic biographical questions in 8 out of 10 opportunities.  Baseline:  Goal status: INITIAL   ASSESSMENT:  CLINICAL IMPRESSION: Patient is a 47 y.o. female who was seen today for a speech-language evaluation. Pt presents with moderate-severe expressive > receptive aphasia. Pt's expressive communication is c/b severe word finding deficits,  neologisms, phonemic and semantic paraphasias. Pt with islands of error free speech and well as automatic speech. This results in reduced express basic wants and needs. Pt also presents with receptive language deficits that impact her ability to answer basic yes/no questions and follow basic directions, although auditory comprehension is a relative strength. Writing was not directly assessed; however, pt wrote name, majority of address, and names of 2/3 children with good accuracy. Paraphasic errors evident.   Overall, pt also demonstrated appropriate attention to tasks with good problem solving as she requested SLP to "repeat that" several times.   OBJECTIVE IMPAIRMENTS include expressive language, receptive language, and aphasia.These impairments are limiting patient from return to work, managing medications, managing appointments, managing finances, household responsibilities, ADLs/IADLs,  and effectively communicating at home and in community. Factors affecting potential to achieve goals and functional outcome are severity of impairments and time since stroke . Patient will benefit from skilled SLP services to address above impairments and improve overall function.  REHAB POTENTIAL: Good  PLAN: SLP FREQUENCY: 1-2x/week  SLP DURATION: 12 weeks  PLANNED INTERVENTIONS: Language facilitation, Cueing hierachy, Internal/external aids, Functional tasks, Multimodal communication approach, and SLP instruction and feedback   Clyde Canterbury, M.S., CCC-SLP Speech-Language Pathologist [facility] [phone] Arnette Felts)  [place] [facility] MAIN [facility] SERVICES [address] [place], [place], [address] Phone: [phone]   Fax:  [phone]

## 2022-12-26 NOTE — Therapy (Addendum)
OUTPATIENT PHYSICAL THERAPY NEURO EVALUATION   Patient Name: Cheyenne Martinez MRN: [id number] DOB:1976/01/01, 47 y.o., female Today's Date: 12/26/2022   PCP:   Cindi Carbon PROVIDER:  Ignacia Bayley PA  END OF SESSION:  PT End of Session - 12/26/22 1310     Visit Number 1    Number of Visits 24    Date for PT Re-Evaluation 03/20/23    PT Start Time 0930    PT Stop Time 1015    PT Time Calculation (min) 45 min    Equipment Utilized During Treatment Gait belt    Activity Tolerance Patient tolerated treatment well    Behavior During Therapy Impulsive;Restless             Past Medical History:  Diagnosis Date   Diabetes mellitus without complication (HCC)    Hypertension    Stroke [facility])    History reviewed. No pertinent surgical history. Patient Active Problem List   Diagnosis Date Noted   CVA (cerebral vascular accident) (HCC) 07/01/2021   New onset type 2 diabetes mellitus (HCC) 07/01/2021   Dyslipidemia 07/01/2021   Hypertensive urgency 07/01/2021    ONSET DATE:  07/01/2021  REFERRING NWGN:F62.13 (ICD-10-CM) - Personal history of transient ischemic attack (TIA), and cerebral infarction without residual deficits  THERAPY DIAG:   Muscle weakness (generalized)   Unsteadiness on feet   Hemiplegia and hemiparesis following cerebral infarction affecting  Right dominant side (HCC)   Other abnormalities of gait and mobility   Acute ischemic L ACA stroke (HCC)  Rationale for Evaluation and Treatment: Rehabilitation  SUBJECTIVE:                                                                                                                                                                                             SUBJECTIVE STATEMENT: Pt reported: " my R arm and leg is hurting when  I put pressure on my leg and makes my balance bad. I am having difficulties with my R leg which makes me unsteady with all standing activities.   Pt accompanied by:  Mother  PERTINENT  HISTORY: Pt is a 47 y/o female who had 2 episodes of stroke and has received Physical therapy in the same OP clinic and has returned due to decline in functional mobility and  pain RLE with WB activities.  Pt with PMHx:   CVA x 2,   type 2 diabetes mellitus (HCC)   Hypertensive urgency   Dyslipidemia  PAIN:  Are you having pain? Yes: Pain location: 0/10 Pain description: sharp Aggravating factors: WB Relieving factors: Rest  PRECAUTIONS: Fall  RED FLAGS: None  WEIGHT BEARING RESTRICTIONS: No  FALLS: Has patient fallen in last 6 months? No  LIVING ENVIRONMENT: Lives with: lives with their family Lives in: House/apartment Stairs: Yes: External: 4 steps; can reach both Has following equipment at home: None  PLOF: Independent with basic ADLs, Independent with household mobility without device, Independent with homemaking with ambulation, Independent with gait, Independent with transfers, and Leisure: driving  PATIENT GOALS: " I want to walk better and without falling and pain.   OBJECTIVE:  Note: Objective measures were completed at Evaluation unless otherwise noted.  DIAGNOSTIC FINDINGS: None for 2024 07/04/2021:  Evolving recent infarcts as seen on recent MRI. There is likely mild petechial hemorrhage associated with the left parieto-occipital infarct. No significant mass effect.   Plaque at the left greater than right ICA origins without hemodynamically significant stenosis. Marked stenosis at the right vertebral origin without apparent flow limitation.   Intracranial atherosclerosis involving anterior and posterior circulations. No proximal intracranial vessel occlusion.  COGNITION: Overall cognitive status: Within functional limits for tasks assessed   SENSATION: WFL  COORDINATION:   MUSCLE TONE: RLE: Moderate and Hypertonic  MUSCLE LENGTH: Hamstrings: Right 20 deg; Left WFL deg   POSTURE: rounded shoulders and R sided lean  LOWER EXTREMITY ROM:     PROM WFL AROM WFL    LOWER EXTREMITY MMT:    MMT Right Eval Left Eval  Hip flexion 3/5 4-/5  Hip extension 3/5 4/5  Hip abduction 3/5 4/5  Hip adduction 4-/5 4/5  Hip internal rotation 3/5 4-/5  Hip external rotation 3/5 4-/5  Knee flexion 3+/5 4/5  Knee extension 3/5 4/5  Ankle dorsiflexion 3-/5 4/5  Ankle plantarflexion    Ankle inversion    Ankle eversion    (Blank rows = not tested)  BED MOBILITY:  Independent  TRANSFERS: Assistive device utilized: None  Sit to stand: Complete Independence Stand to sit: Complete Independence Chair to chair: Complete Independence Floor: SBA  RAMP:  Level of Assistance: CGA Assistive device utilized: None Ramp Comments: Pt needs CGA to ascend and Min to descend.   CURB:  Level of Assistance: CGA Assistive device utilized: None Curb Comments: Due to weakness, unsteady on feet and difficulty raising up concentrically.   STAIRS: Level of Assistance: CGA Stair Negotiation Technique: Step to Pattern Forwards with Single Rail on Left Number of Stairs: 4  Height of Stairs: 6 inch  Comments: Slow with heavy reliance on HR with one step a t a time.   GAIT: Gait pattern: step through pattern, decreased arm swing- Right, decreased step length- Right, decreased stance time- Left, decreased stride length, decreased ankle dorsiflexion- Right, and genu recurvatum- Right Distance walked: 39ft Assistive device utilized: None Level of assistance: SBA Comments: Impulsive, and needs VC for safety and proper technique  FUNCTIONAL TESTS:  8 reps in 30 seconds chair stand test Timed up and go (TUG): 13secs 10 meter walk test: 12 secs= 1.66m/sec Balance Test: Unable to Tandem stand, unable to SL stand, Rhomberg on NBOS x 20 secs  PATIENT SURVEYS:  LEFS 56/80 FOTO Complete FOTO next session.  TODAY'S TREATMENT:  DATE:  TA:  Sci-Fit x 6 mins at level 3  PT discussed findings, safety and goals to establish Pt specific POC. PT also educated pt about the significance of HEP to imporve rehab outcomes.     PATIENT EDUCATION: Education detailsNetwork engineer, findings, rehab progression, significance of HEP Person educated: Patient and Parent Education method: Explanation Education comprehension: verbalized understanding, verbal cues required, and needs further education  HOME EXERCISE PROGRAM: Will be provided next session.    GOALS: Goals reviewed with patient? Yes  SHORT TERM GOALS: Target date: 01/12/2023  Patient will be independent in home exercise program to improve strength/mobility for better functional independence with ADLs.  Baseline: HEP administered 12/26/2022 Goal status: INITIAL   LONG TERM GOALS: Target date: 03/22/2022  Patient will complete >12 times sit to stand test in 30 seconds indicating an increased LE strength and improved balance.  Baseline: 8 reps in 30 secs Goal status: INITIAL  2.  Pt will improve LEFS score >64/80  Baseline: 56/80 Goal status: INITIAL  3.  Pt will improve Hamstring Length to <10 degrees to improve LE function and balance Baseline: 20 deg Hamstring lenght Goal status: INITIAL  4.  Pt will tandem stand for 30 secs without LOB.  Baseline: Unable Goal status: INITIAL  5.Pt will complete TUG in <10 secs to improve balance and safety with all functional mobility.  Baseline: 13secs Goal status: INITIAL  6.  Pt will complete 10 M gait speed in <9 secs to demonstrate safety with community level ambulation Baseline: 1.48m/sec Goal status: INITIAL  ASSESSMENT:  CLINICAL IMPRESSION: Patient is a 47  y.o. female  who was seen today for physical therapy evaluation and treatment for post stroke decline in functional mobility and pain in RLE. Pt PLOF after stroke in June 2023 Independent with all functional mobility and driving. As per pt,  Pt is unable  to step up the curb, has difficulty with stairs, overall weakness. PT assessment revealed hamstring tightness in RLE, weak Hips and LLE, balance deficits, genu recurvatum in R knee limiting pt with safe funcitonal mobility confirmed with LEFS score, TUG time, 80M gait speed test. Pt will benefit from 1-2 x week x 12 weeks of skilled PT interventions to address impairments and help pt achieve her goals.     OBJECTIVE IMPAIRMENTS: Abnormal gait, decreased activity tolerance, decreased balance, decreased coordination, decreased endurance, decreased mobility, difficulty walking, decreased ROM, decreased strength, and decreased safety awareness.   ACTIVITY LIMITATIONS: lifting, bending, stairs, and locomotion level  PARTICIPATION LIMITATIONS: driving, shopping, community activity, and stairs  PERSONAL FACTORS: Age, Time since onset of injury/illness/exacerbation, and previous benefit from the skilled PT interventions  are also affecting patient's functional outcome.   REHAB POTENTIAL: Good  CLINICAL DECISION MAKING: Stable/uncomplicated  EVALUATION COMPLEXITY: Low  PLAN:  PT FREQUENCY: 1-2x/week  PT DURATION: 12 weeks  PLANNED INTERVENTIONS: 97164- PT Re-evaluation, 97110-Therapeutic exercises, 97530- Therapeutic activity, 97112- Neuromuscular re-education, 97535- Self Care, 16109- Manual therapy, [phone]- Gait training, [phone]- Orthotic Fit/training, Patient/Family education, Balance training, Stair training, Taping, Cryotherapy, and Moist heat  PLAN FOR NEXT SESSION: complete FOTO, Sci-Fit, gait training, Hamstring stretch, balance activities   Janet Berlin PT DPT 5:02 PM,12/26/22

## 2023-01-01 ENCOUNTER — Encounter: Payer: Self-pay | Admitting: Physical Therapy

## 2023-01-01 ENCOUNTER — Ambulatory Visit: Payer: Medicaid Other

## 2023-01-01 ENCOUNTER — Ambulatory Visit: Payer: Medicaid Other | Attending: Physician Assistant | Admitting: Physical Therapy

## 2023-01-01 DIAGNOSIS — R4701 Aphasia: Secondary | ICD-10-CM | POA: Diagnosis present

## 2023-01-01 DIAGNOSIS — M6281 Muscle weakness (generalized): Secondary | ICD-10-CM | POA: Insufficient documentation

## 2023-01-01 DIAGNOSIS — R482 Apraxia: Secondary | ICD-10-CM | POA: Diagnosis present

## 2023-01-01 DIAGNOSIS — I63532 Cerebral infarction due to unspecified occlusion or stenosis of left posterior cerebral artery: Secondary | ICD-10-CM

## 2023-01-01 DIAGNOSIS — R2681 Unsteadiness on feet: Secondary | ICD-10-CM | POA: Diagnosis present

## 2023-01-01 DIAGNOSIS — R262 Difficulty in walking, not elsewhere classified: Secondary | ICD-10-CM | POA: Diagnosis present

## 2023-01-01 DIAGNOSIS — R278 Other lack of coordination: Secondary | ICD-10-CM | POA: Diagnosis present

## 2023-01-01 DIAGNOSIS — R41841 Cognitive communication deficit: Secondary | ICD-10-CM | POA: Diagnosis present

## 2023-01-01 DIAGNOSIS — Z8673 Personal history of transient ischemic attack (TIA), and cerebral infarction without residual deficits: Secondary | ICD-10-CM | POA: Insufficient documentation

## 2023-01-01 NOTE — Therapy (Signed)
OUTPATIENT PHYSICAL THERAPY NEURO EVALUATION   Patient Name: Cheyenne Martinez MRN: [id number] DOB:1975-03-14, 47 y.o., female Today's Date: 01/01/2023   PCP:   Cindi Carbon PROVIDER:  Ignacia Bayley PA  END OF SESSION:  PT End of Session - 01/01/23 1119     Visit Number 2    Number of Visits 24    Date for PT Re-Evaluation 03/20/23    PT Start Time 1148    PT Stop Time 1228    PT Time Calculation (min) 40 min    Equipment Utilized During Treatment Gait belt    Activity Tolerance Patient tolerated treatment well    Behavior During Therapy Impulsive;Restless             Past Medical History:  Diagnosis Date   Diabetes mellitus without complication (HCC)    Hypertension    Stroke (HCC)    No past surgical history on file. Patient Active Problem List   Diagnosis Date Noted   CVA (cerebral vascular accident) (HCC) 07/01/2021   New onset type 2 diabetes mellitus (HCC) 07/01/2021   Dyslipidemia 07/01/2021   Hypertensive urgency 07/01/2021    ONSET DATE:  07/01/2021  REFERRING WGNF:A21.30 (ICD-10-CM) - Personal history of transient ischemic attack (TIA), and cerebral infarction without residual deficits  THERAPY DIAG:   Muscle weakness (generalized)   Unsteadiness on feet   Hemiplegia and hemiparesis following cerebral infarction affecting  Right dominant side (HCC)   Other abnormalities of gait and mobility   Acute ischemic L ACA stroke (HCC)  Rationale for Evaluation and Treatment: Rehabilitation  SUBJECTIVE:                                                                                                                                                                                             SUBJECTIVE STATEMENT: Pt with moderate expressive aphasia. Reports no pain and having a good weekend, but it was  busy per mother. .  Pt accompanied by:  Mother  PERTINENT HISTORY: Pt is a 47 y/o female who had 2 episodes of stroke and has received Physical therapy in the same  OP clinic and has returned due to decline in functional mobility and  pain RLE with WB activities.  Pt with PMHx:   CVA x 2,   type 2 diabetes mellitus (HCC)   Hypertensive urgency   Dyslipidemia  PAIN:  Are you having pain? Yes: Pain location: 0/10 Pain description: sharp Aggravating factors: WB Relieving factors: Rest  PRECAUTIONS: Fall  RED FLAGS: None   WEIGHT BEARING RESTRICTIONS: No  FALLS: Has patient fallen in last 6 months? No  LIVING ENVIRONMENT: Lives with:  lives with their family Lives in: House/apartment Stairs: Yes: External: 4 steps; can reach both Has following equipment at home: None  PLOF: Independent with basic ADLs, Independent with household mobility without device, Independent with homemaking with ambulation, Independent with gait, Independent with transfers, and Leisure: driving  PATIENT GOALS: " I want to walk better and without falling and pain.   OBJECTIVE:  Note: Objective measures were completed at Evaluation unless otherwise noted.  DIAGNOSTIC FINDINGS: None for 2024 07/04/2021:  Evolving recent infarcts as seen on recent MRI. There is likely mild petechial hemorrhage associated with the left parieto-occipital infarct. No significant mass effect.   Plaque at the left greater than right ICA origins without hemodynamically significant stenosis. Marked stenosis at the right vertebral origin without apparent flow limitation.   Intracranial atherosclerosis involving anterior and posterior circulations. No proximal intracranial vessel occlusion.  COGNITION: Overall cognitive status: Within functional limits for tasks assessed   SENSATION: WFL  COORDINATION:   MUSCLE TONE: RLE: Moderate and Hypertonic  MUSCLE LENGTH: Hamstrings: Right 20 deg; Left WFL deg   POSTURE: rounded shoulders and R sided lean  LOWER EXTREMITY ROM:    PROM WFL AROM WFL    LOWER EXTREMITY MMT:    MMT Right Eval Left Eval  Hip flexion 3/5 4-/5  Hip  extension 3/5 4/5  Hip abduction 3/5 4/5  Hip adduction 4-/5 4/5  Hip internal rotation 3/5 4-/5  Hip external rotation 3/5 4-/5  Knee flexion 3+/5 4/5  Knee extension 3/5 4/5  Ankle dorsiflexion 3-/5 4/5  Ankle plantarflexion    Ankle inversion    Ankle eversion    (Blank rows = not tested)  BED MOBILITY:  Independent  TRANSFERS: Assistive device utilized: None  Sit to stand: Complete Independence Stand to sit: Complete Independence Chair to chair: Complete Independence Floor: SBA  RAMP:  Level of Assistance: CGA Assistive device utilized: None Ramp Comments: Pt needs CGA to ascend and Min to descend.   CURB:  Level of Assistance: CGA Assistive device utilized: None Curb Comments: Due to weakness, unsteady on feet and difficulty raising up concentrically.   STAIRS: Level of Assistance: CGA Stair Negotiation Technique: Step to Pattern Forwards with Single Rail on Left Number of Stairs: 4  Height of Stairs: 6 inch  Comments: Slow with heavy reliance on HR with one step a t a time.   GAIT: Gait pattern: step through pattern, decreased arm swing- Right, decreased step length- Right, decreased stance time- Left, decreased stride length, decreased ankle dorsiflexion- Right, and genu recurvatum- Right Distance walked: 70ft Assistive device utilized: None Level of assistance: SBA Comments: Impulsive, and needs VC for safety and proper technique  FUNCTIONAL TESTS:  8 reps in 30 seconds chair stand test Timed up and go (TUG): 13secs 10 meter walk test: 12 secs= 1.58m/sec Balance Test: Unable to Tandem stand, unable to SL stand, Rhomberg on NBOS x 20 secs  PATIENT SURVEYS:  LEFS 56/80 FOTO Complete FOTO next session.  TODAY'S TREATMENT:  DATE:   Completed Foto assessment with assistance from mother.   Gait without AD x 17ft + 17ft x2 . Noted  to have significant GR on the RLE as well as trendelenberg on the R hip with fatigue and when rushing. .   Forward Foot tap on 6 inch step x 10 bil  Lateral foot tap on 6 inch step  x 10 bil   LAQ x 10  HS curl x 10  Ankle DF x 15  Hip abduction in sitting x 12 RTB  HS curl standing x 10 bil  Sit<>stand x 10 with LUE pushing from thigh  Stair management training, 4 steps x 3 bout. Step to on ascent ffor first bout then progressed to step through for second and third bout. Step to on descent for all 3 bouts.   Forward reverse gait at rail, 13ft x 4 laps. With tactile cues for improved HS activation and increased step length.    PATIENT EDUCATION: Education detailsNetwork engineer, findings, rehab progression, significance of HEP Person educated: Patient and Parent Education method: Explanation Education comprehension: verbalized understanding, verbal cues required, and needs further education  HOME EXERCISE PROGRAM:   Access Code: [email or web address] URL: [email or web address] Date: 01/01/2023 Prepared by: Grier Rocher  Exercises - Seated Long Arc Quad  - 1 x daily - 7 x weekly - 3 sets - 10 reps - Seated Heel Slide  - 1 x daily - 7 x weekly - 3 sets - 10 reps - 2 hold - Sit to Stand  - 1 x daily - 7 x weekly - 3 sets - 10 reps - Seated Toe Raise  - 1 x daily - 7 x weekly - 3 sets - 15 reps - 2 hold - Standing Alternating Knee Flexion  - 1 x daily - 7 x weekly - 3 sets - 10 reps - 2 hold   GOALS: Goals reviewed with patient? Yes  SHORT TERM GOALS: Target date: 01/12/2023  Patient will be independent in home exercise program to improve strength/mobility for better functional independence with ADLs.  Baseline: HEP administered 12/26/2022 Goal status: INITIAL   LONG TERM GOALS: Target date: 03/22/2022  Patient will complete >12 times sit to stand test in 30 seconds indicating an increased LE strength and improved balance.  Baseline: 8 reps in 30 secs Goal status:  INITIAL  2.  Pt will improve LEFS score >64/80  Baseline: 56/80 Goal status: INITIAL  3.  Pt will improve Hamstring Length to <10 degrees to improve LE function and balance Baseline: 20 deg Hamstring lenght Goal status: INITIAL  4.  Pt will tandem stand for 30 secs without LOB.  Baseline: Unable Goal status: INITIAL  5.Pt will complete TUG in <10 secs to improve balance and safety with all functional mobility.  Baseline: 13secs Goal status: INITIAL  6.  Pt will complete 10 M gait speed in <9 secs to demonstrate safety with community level ambulation Baseline: 1.53m/sec Goal status: INITIAL  7.  Pt will improve FOTO score by 5 points  Baseline: 64 Goal status: INITIAL   ASSESSMENT:  CLINICAL IMPRESSION: Patient is a 47  y.o. female  who was seen today for physical therapy treatment for post stroke decline in functional mobility and pain in RLE. PT instructed pt in HEP as well as continued assessment of mobility with stair management training. Pt demonstrates heavy GR< but able to correct with mod-max tactile and verbal cues for attention to the RLE. Pt will benefit from 1-2 x week  x 12 weeks of skilled PT interventions to address impairments and help pt achieve her goals.     OBJECTIVE IMPAIRMENTS: Abnormal gait, decreased activity tolerance, decreased balance, decreased coordination, decreased endurance, decreased mobility, difficulty walking, decreased ROM, decreased strength, and decreased safety awareness.   ACTIVITY LIMITATIONS: lifting, bending, stairs, and locomotion level  PARTICIPATION LIMITATIONS: driving, shopping, community activity, and stairs  PERSONAL FACTORS: Age, Time since onset of injury/illness/exacerbation, and previous benefit from the skilled PT interventions  are also affecting patient's functional outcome.   REHAB POTENTIAL: Good  CLINICAL DECISION MAKING: Stable/uncomplicated  EVALUATION COMPLEXITY: Low  PLAN:  PT FREQUENCY: 1-2x/week  PT  DURATION: 12 weeks  PLANNED INTERVENTIONS: 97164- PT Re-evaluation, 97110-Therapeutic exercises, 97530- Therapeutic activity, 97112- Neuromuscular re-education, 97535- Self Care, 16109- Manual therapy, [phone]- Gait training, [phone]- Orthotic Fit/training, Patient/Family education, Balance training, Stair training, Taping, Cryotherapy, and Moist heat  PLAN FOR NEXT SESSION:   Dynamic balance and strengthening for RLE>    Janet Berlin PT DPT 11:48 AM,01/01/23

## 2023-01-01 NOTE — Therapy (Signed)
OUTPATIENT SPEECH LANGUAGE PATHOLOGY APHASIA TREATMENT   Patient Name: Cheyenne Martinez MRN: [id number] DOB:March 03, 1975, 47 y.o., female Today's Date: 01/01/2023  PCP: No PCP REFERRING PROVIDER: Ignacia Bayley, PA-C   End of Session - 01/01/23 1200     Visit Number 2    Number of Visits 24    Date for SLP Re-Evaluation 03/20/23    Authorization Type [place] Medicaid Prepaid [employer]    Authorization Time Period 11/4- 02/25/23 for 24 SLP visits    Authorization - Visit Number 2    Authorization - Number of Visits 24    Progress Note Due on Visit 20    SLP Start Time 1100    SLP Stop Time  1145    SLP Time Calculation (min) 45 min    Activity Tolerance Patient tolerated treatment well             Past Medical History:  Diagnosis Date   Diabetes mellitus without complication (HCC)    Hypertension    Stroke (HCC)    No past surgical history on file. Patient Active Problem List   Diagnosis Date Noted   CVA (cerebral vascular accident) (HCC) 07/01/2021   New onset type 2 diabetes mellitus (HCC) 07/01/2021   Dyslipidemia 07/01/2021   Hypertensive urgency 07/01/2021    ONSET DATE: 07/01/2021   REFERRING DIAG: Cerebral infarction involving left posterior cerebral artery (HCC)  THERAPY DIAG:  Aphasia  Cerebral infarction involving left posterior cerebral artery (HCC)  Rationale for Evaluation and Treatment Rehabilitation  SUBJECTIVE:   SUBJECTIVE STATEMENT: Pt pleasant, motivated Pt accompanied by: self and family member; mother, [name]  PERTINENT HISTORY: Patient is a 47 year old female with history of untreated hypertension, hyperlipidemia, and type II diabetes presented to [facility] ED on 07/01/2021 with report of sudden onset of memory difficulty and slurred speech.    PAIN:  Are you having pain? No  FALLS: Has patient fallen in last 6 months?  No  LIVING ENVIRONMENT: Lives with: lives with their family Lives in: House/apartment  PLOF:  Level of assistance:  Independent with ADLs   PATIENT GOALS to improve communication  OBJECTIVE:   DIAGNOSTIC FINDINGS: 07/01/2021 MRI BRAIN W WO CONTRAST  IMPRESSION:  1. Evolving early subacute left PCA distribution infarct as above,  with additional scattered small volume ischemic changes within the  left frontotemporal and right parietal lobe as above. Associated  mild petechial blood products at the left occipital lobe without  frank hemorrhagic transformation or significant mass effect.  2. Underlying mild chronic microvascular ischemic disease with  chronic right cerebellar infarct.    07/04/2021 CTA HEAD NECK W WO CONTRAST  IMPRESSION:  Evolving recent infarcts as seen on recent MRI. There is likely mild  petechial hemorrhage associated with the left parieto-occipital  infarct. No significant mass effect.  Plaque at the left greater than right ICA origins without  hemodynamically significant stenosis. Marked stenosis at the right  vertebral origin without apparent flow limitation.  Intracranial atherosclerosis involving anterior and posterior  circulations. No proximal intracranial vessel occlusion.    COGNITION: Overall cognitive status: Difficulty to assess due to: Communication impairment and severity of deficits Areas of impairment:  N/A Functional deficits: N/A  AUDITORY COMPREHENSION: Overall auditory comprehension: Impaired: simple YES/NO questions: Impaired: simple Following directions: Impaired: simple Conversation: Simple Interfering components:  N/A Effective technique: repetition/stressing words and stressing words   READING COMPREHENSION: Impaired: word  EXPRESSION: verbal  VERBAL EXPRESSION: Level of generative/spontaneous verbalization: word and phrase Automatic speech: name: intact  and social response: intact  Repetition: TBA Naming: Responsive: 0-25%, Confrontation: 0-25%, and Divergent: 0-25% Pragmatics: Appears intact Interfering components:  N/A Effective technique:  N/A Non-verbal means of communication: N/A  WRITTEN EXPRESSION: Dominant hand: right  Written expression: Not tested  MOTOR SPEECH: Overall motor speech: impaired Level of impairment: Word Respiration: diaphragmatic/abdominal breathing Phonation: normal Resonance: WFL Articulation: Impaired: word Intelligibility: Intelligibility reduced minimally Motor planning: Impaired: aware and consistent Motor speech errors: aware and consistent Interfering components:  N/A Effective technique:  N/A   ORAL MOTOR EXAMINATION Overall status: Impaired:   Labial: Right (ROM, Symmetry, Strength, and Sensation) Facial: Right (ROM, Symmetry, Strength, and Sensation)   TODAY'S TREATMENT: SLP facilitated tx via completion of Repetition Subtest on WAB-R as well as portions of Reading Comprehension Battery for Aphasia.   Western Aphasia Battery- Revised  Spontaneous Speech                           Information content               3/10                                            Fluency                                 4/10                                          Comprehension     Yes/No questions                 42/60                                           Auditory Word Recognition  29/60                                Sequential Commands       21/80                              Repetition                             60/100                                        Naming    Object Naming                     18/60                                           Word Fluency  2/20                                            Sentence Completion          4/10                                             Responsive Speech              2/10                                         Aphasia Quotient                  40.4/100         *Repetition subtest completed today        Pt's severity rating was severe as indicated by an Aphasia Quotient of 40.4   (0-25=very severe, 26-50=severe, 51-75=moderate, 76 and above is mild).    Reading Comprehension Battery for Aphasia    I. Word-Visual 5/10   II. Word-Auditory 8/10   II. Word Semantic 6/10   IV. Functional Reading - attempted, pt stating "I don't, I can't." Pt unable to reading sentence level stimuli.   Compensations: Brief introduction to circumlocution / semantic features analysis. Pt required max A to generate x1 detail about x5 objects. Introduced use of Estate agent on phone Best boy Talk Aphasia). SLP downloaded to phone and encouraged pt to play around with it prior to next session. Consideration for SGD in the future.   Functional communication/supported communication: Pt's mother filled out basic pt intake form to utilize for functional targets in upcoming sessions.  HEP: play around on Small Talk Aphasia App  PATIENT EDUCATION: Education details: results of this assessment, ST POC Person educated: Patient and Caregiver , mother Education method: Explanation, Demonstration, and Verbal cues Education comprehension: needs further education   GOALS: Goals reviewed with patient? No  SHORT TERM GOALS: Target date: 10 sessions  Pt will participate in further assessment of functional reading, writing, and repetition.  Baseline:  Goal status: INITIAL  2.  Pt will follow 1-step directions with >50% accuracy with multimodal cues. Baseline:  Goal status: INITIAL  3.  Pt will repeat single words using a speech generating device in 50% of opportunities given frequent maximal verbal and maximal visual cues. Baseline:  Goal status: INITIAL  4. With Moderate A-Maximal A, patient will perform structured naming tasks with fair-good approximations 50% of the time.   Baseline:  Goal Status: INITIAL  5. Using multimodal communication and assistance as needed, pt will answer basic biographical questions with 60% accuracy.   Baseline:    Goal Status:  INITIAL     LONG TERM GOALS: Target date: 03/20/23  With Min A, patient/family will demonstrate understanding of the following concepts: aphasia, spontaneous recovery, communication vs conversation, strengths/strategies to promote success in all communication settings. Baseline:  Goal status: INITIAL  2.  Using multimodal communication, pt will answer basic biographical questions in 8 out of 10 opportunities.  Baseline:  Goal status: INITIAL   ASSESSMENT:  CLINICAL IMPRESSION: Patient is a 47 y.o. female who was seen today for  a speech-language treatment. 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 Pt's expressive communication is c/b severe word finding deficits, neologisms, phonemic and semantic paraphasias. Pt with islands of error free speech and well as automatic speech. Pt with concomitant apraxia of speech.This results in reduced express basic wants and needs. Pt also presents with receptive language deficits that impact her ability to answer basic yes/no questions and follow basic directions, although auditory comprehension is a relative strength. See details of tx session above.   OBJECTIVE IMPAIRMENTS include expressive language, receptive language, and aphasia.These impairments are limiting patient from return to work, managing medications, managing appointments, managing finances, household responsibilities, ADLs/IADLs, and effectively communicating at home and in community. Factors affecting potential to achieve goals and functional outcome are severity of impairments and time since stroke . Patient will benefit from skilled SLP services to address above impairments and improve overall function.  REHAB POTENTIAL: Good  PLAN: SLP FREQUENCY: 1-2x/week  SLP DURATION: 12 weeks  PLANNED INTERVENTIONS: Language facilitation, Cueing hierachy, Internal/external aids, Functional tasks, Multimodal communication approach, and SLP instruction  and feedback   Clyde Canterbury, M.S., CCC-SLP Speech-Language Pathologist [facility] [phone] Arnette Felts)  [facility] [address] [place], [place], [address] Phone: [phone]   Fax:  [phone]

## 2023-01-03 ENCOUNTER — Ambulatory Visit: Payer: Medicaid Other | Admitting: Physical Therapy

## 2023-01-09 ENCOUNTER — Ambulatory Visit: Payer: Medicaid Other

## 2023-01-09 DIAGNOSIS — R278 Other lack of coordination: Secondary | ICD-10-CM

## 2023-01-09 DIAGNOSIS — R4701 Aphasia: Secondary | ICD-10-CM

## 2023-01-09 DIAGNOSIS — R482 Apraxia: Secondary | ICD-10-CM

## 2023-01-09 DIAGNOSIS — M6281 Muscle weakness (generalized): Secondary | ICD-10-CM

## 2023-01-09 DIAGNOSIS — R262 Difficulty in walking, not elsewhere classified: Secondary | ICD-10-CM | POA: Diagnosis not present

## 2023-01-09 DIAGNOSIS — Z8673 Personal history of transient ischemic attack (TIA), and cerebral infarction without residual deficits: Secondary | ICD-10-CM

## 2023-01-09 DIAGNOSIS — I63532 Cerebral infarction due to unspecified occlusion or stenosis of left posterior cerebral artery: Secondary | ICD-10-CM

## 2023-01-09 NOTE — Therapy (Signed)
OUTPATIENT OCCUPATIONAL THERAPY NEURO EVALUATION  Patient Name: Cheyenne Martinez MRN: [id number] DOB:[birth year], 47 y.o., female Today's Date: 01/14/2023  PCP: Dr. Burnadette Pop REFERRING PROVIDER: Joellyn Haff, PA  END OF SESSION:  OT End of Session - 01/14/23 1645     Visit Number 1    Number of Visits 24    Date for OT Re-Evaluation 04/03/23    Authorization Time Period Reporting period beginning 01/09/23    Progress Note Due on Visit 10    OT Start Time 0805    OT Stop Time 0845    OT Time Calculation (min) 40 min    Activity Tolerance Patient tolerated treatment well    Behavior During Therapy WFL for tasks assessed/performed            Past Medical History:  Diagnosis Date   Diabetes mellitus without complication (HCC)    Hypertension    Stroke (HCC)    No past surgical history on file. Patient Active Problem List   Diagnosis Date Noted   CVA (cerebral vascular accident) (HCC) 07/01/2021   New onset type 2 diabetes mellitus (HCC) 07/01/2021   Dyslipidemia 07/01/2021   Hypertensive urgency 07/01/2021    ONSET DATE: 07/01/21  REFERRING DIAG: History of Cva, muscle weakness, lack of coordination  THERAPY DIAG:  Muscle weakness (generalized)  Other lack of coordination  History of CVA (cerebrovascular accident)  Rationale for Evaluation and Treatment: Rehabilitation  SUBJECTIVE:  SUBJECTIVE STATEMENT: Pt verbalizes wanting to improve strength and increase use of her R arm. Pt accompanied by:  Mother, [name]  PERTINENT HISTORY:  Pt known to this OT as pt participated in OT in this clinic after suffering a CVA in June of '23.   Per this writer's OT d/c note on 11/09/21: Pt was seen by OT for 18 sessions to address visual and RUE deficits following L PCA stroke. Pt has made strong functional gains, as noted by increased FOTO score from 36 at eval to 60 at discharge. Pt engages her RUE into self care tasks as a fair-good stabilizer, but pt relies on L non-dominant  arm for tasks which require strength and dexterity. Pt has increased attention to R side, requiring min vc on occasion for R hand proprioception. HEP was reviewed this date with pt/mother acknowledging understanding and benefits of consistent/daily participation to help pt continue to improve use of the RUE. OT encouraged new therapy referral in the new year if pt wishes to continue to focus on residual deficits post stroke. Discharge completed this date d/t visit limitations with insurance within this calendar year.   PRECAUTIONS: Fall  WEIGHT BEARING RESTRICTIONS: No  PAIN:  Are you having pain? No  FALLS: Has patient fallen in last 6 months? No  LIVING ENVIRONMENT: Lives with: lives with their family, Adult daughter 45 years old, and 2 sons 90 and 34 years old; mom lives 5 min away Lives in: house Stairs: 4 steps, no rail Has following equipment at home: None (walk in shower and standard toilet)  PLOF: Independent prior to CVA  PATIENT GOALS: improve speech, use of arm, improve leg strength  OBJECTIVE:  Note: Objective measures were completed at Evaluation unless otherwise noted.  HAND DOMINANCE: Right  ADLs: Overall ADLs: Uses RUE as a fair stabilizer to non-dominant L arm Transfers/ambulation related to ADLs:  Eating: cuts food with L hand cut only Grooming: uses L hand to manipulate grooming utensils; requires assist for bimanual tasks such as putting up hair UB Dressing: indep to don  a sports bra; unable to manage clothing fasteners LB Dressing: indep to don LB clothing including slip on shoes and elastic waist paints; unable to manage clothing fasteners Toileting: indep with non-dominant hand Bathing: modified indep with limited use of the R dominant hand Tub Shower transfers: indep (walk in shower) Equipment: none  IADLs: Shopping: pt now driving, indep Light housekeeping: shared between pt and children Meal Prep: pt cooks daily, minimal use of the R dominant arm  other than to stabilize meal prep items Community mobility: indep without use of AD Medication management: indep with pill Retail buyer: indep on phone  Handwriting: Increased time  MOBILITY STATUS:  R sided hemiparesis; able to amb community distances without AD  POSTURE COMMENTS:  R sided hemiparesis Sitting balance: Moves/returns truncal midpoint >2 inches in all planes  ACTIVITY TOLERANCE: Activity tolerance: WFL for tasks assessed  FUNCTIONAL OUTCOME MEASURES: FOTO: 61; predicted 62  UPPER EXTREMITY ROM:   Active ROM Right eval Left eval  Shoulder flexion 70   Shoulder abduction 70   Shoulder adduction    Shoulder extension    Shoulder internal rotation Hand to iliac crest    Shoulder external rotation [facility]   Elbow flexion    Elbow extension    Wrist flexion    Wrist extension    Wrist ulnar deviation    Wrist radial deviation    Wrist pronation    Wrist supination 72   (Blank rows = not tested)  UPPER EXTREMITY MMT:     MMT Right eval Left eval  Shoulder flexion 3- 4+  Shoulder abduction 3+ (within available range) 4+  Shoulder adduction    Shoulder extension    Shoulder internal rotation    Shoulder external rotation    Middle trapezius    Lower trapezius    Elbow flexion 4- 4+  Elbow extension 4+ 4+  Wrist flexion 4- 4+  Wrist extension 4- 4+  Wrist ulnar deviation    Wrist radial deviation    Wrist pronation    Wrist supination    (Blank rows = not tested)  HAND FUNCTION: Grip strength: Right: 19 lbs; Left: 69 lbs, Lateral pinch: Right: 5 lbs, Left: 21 lbs, and 3 point pinch: Right: 6 lbs, Left: 21 lbs  COORDINATION: 9 Hole Peg test: Right: 3 pegs placed in 2 min 21 sec  sec; Left: 22 sec  SENSATION: Light touch: Impaired   EDEMA: No visible edema  MUSCLE TONE: RUE: Hypertonic  COGNITION: Overall cognitive status: Impaired; expressive and receptive aphasia  VISION ASSESSMENT: Not tested; limited by time  constraints d/t pt arriving late to eval; will assess as needed in upcoming sessions/within functional contexts   PERCEPTION: Not tested this date; will assess in upcoming sessions  PRAXIS: Impaired: Motor planning  OBSERVATIONS: Pt appears happy to be back to OT to begin a 2nd round of rehab, and motivated to work on strengthening the R side for improving use for daily tasks.   TODAY'S TREATMENT:  DATE: 01/09/23: Evaluation completed.   PATIENT EDUCATION: Education details: OT role, goals, poc Person educated: Patient and Parent Education method: Explanation Education comprehension: verbalized understanding  HOME EXERCISE PROGRAM: To be initiated in upcoming sessions  GOALS: Goals reviewed with patient? Yes  SHORT TERM GOALS: Target date: 02/20/23  Pt will be indep to perform HEP for improving RUE strength and coordination for daily tasks. Baseline:  Eval: Not yet initiated Goal status: INITIAL  LONG TERM GOALS: Target date: 04/03/23  Pt will increase FOTO score to 65 or better to indicate improvement in self perceived functional use of the R arm with daily tasks. Baseline: Eval: 61; predicted 62 Goal status: INITIAL  2.  Pt will increase R grip strength by 5-7 lbs in order to hold and transport ADL supplies in R dominant hand with increased confidence. Baseline: Eval: R grip strength 19 lbs. Goal status: INITIAL  3.  Pt will increase R hand FMC/dexterity skills to enable pt to engage the R hand in manipulation of clothing fasteners.  Baseline: Eval: Assist needed with clothing fasteners; R 9 hole peg test: able to place 3 pegs in 2 min 21 sec. Goal status: INITIAL  4.  Pt will increase active R shoulder flexion and abd by 20 or more degrees in order to bimanually reach for items in closet at shoulder level.   Baseline: Eval: R shoulder flexion and  abd 70*; pt uses L arm to reach for items at or above shoulder level. Goal status: INITIAL  ASSESSMENT:  CLINICAL IMPRESSION: Patient is a 47 y.o. female who was seen today for occupational therapy evaluation to increase RUE strength and function d/t functional decline from a  CVA in June of 2023.  Pt participated in her first round of rehab beginning in June of 2023 in this clinic, and had been making good progress towards OT goals.  Pt had to be discharged after 18 visits at that time d/t insurance limitations in that calendar year.  Pt now returns with the goal or continuing OT to increase RUE strength and coordination for daily tasks.  Pt appears very motivated and happy to return, and continues to have good family support from her mother and children.  Pt currently using RUE mostly as a fair stabilizer to the L, and will benefit from additional skilled OT to target strengthening and coordination to better engage the RUE into daily tasks.   PERFORMANCE DEFICITS: in functional skills including ADLs, IADLs, coordination, dexterity, ROM, strength, flexibility, Fine motor control, Gross motor control, mobility, balance, body mechanics, decreased knowledge of use of DME, vision, and UE functional use, cognitive skills including thought and understand, and psychosocial skills including coping strategies, environmental adaptation, habits, interpersonal interactions, and routines and behaviors.   IMPAIRMENTS: are limiting patient from ADLs, IADLs, work, leisure, and social participation.   CO-MORBIDITIES: has co-morbidities such as global aphasia, R sided hemiparesis, HTN  that affects occupational performance. Patient will benefit from skilled OT to address above impairments and improve overall function.  MODIFICATION OR ASSISTANCE TO COMPLETE EVALUATION: No modification of tasks or assist necessary to complete an evaluation.  OT OCCUPATIONAL PROFILE AND HISTORY: Problem focused assessment: Including  review of records relating to presenting problem.  CLINICAL DECISION MAKING: Moderate - several treatment options, min-mod task modification necessary  REHAB POTENTIAL: Good  EVALUATION COMPLEXITY: Moderate    PLAN:  OT FREQUENCY: 1-2x/week  OT DURATION: 12 weeks  PLANNED INTERVENTIONS: 97168 OT Re-evaluation, 97535 self care/ADL training, 16109 therapeutic exercise, 97530 therapeutic  activity, 97112 neuromuscular re-education, 97140 manual therapy, 97116 gait training, 47829 moist heat, 97010 cryotherapy, passive range of motion, balance training, functional mobility training, visual/perceptual remediation/compensation, psychosocial skills training, energy conservation, coping strategies training, patient/family education, and DME and/or AE instructions  RECOMMENDED OTHER SERVICES: Pt has orders for SLP and PT  CONSULTED AND AGREED WITH PLAN OF CARE: Patient and family member/caregiver  PLAN FOR NEXT SESSION: See above  Danelle Earthly, MS, OTR/L   [place]tis Dials, OT 01/14/2023, 4:46 PM

## 2023-01-09 NOTE — Therapy (Signed)
OUTPATIENT SPEECH LANGUAGE PATHOLOGY APHASIA TREATMENT   Patient Name: Cheyenne Martinez MRN: [id number] DOB:04/29/1975, 47 y.o., female Today's Date: 01/09/2023  PCP: No PCP REFERRING PROVIDER: Ignacia Bayley, PA-C   End of Session - 01/09/23 0933     Visit Number 3    Number of Visits 24    Date for SLP Re-Evaluation 03/20/23    Authorization Type [place] Medicaid [employer]    Authorization Time Period 11/4- 02/25/23 for 24 SLP visits    Authorization - Visit Number 3    Authorization - Number of Visits 24    Progress Note Due on Visit 20    SLP Start Time 0830    SLP Stop Time  0915    SLP Time Calculation (min) 45 min    Activity Tolerance Patient tolerated treatment well             Past Medical History:  Diagnosis Date   Diabetes mellitus without complication (HCC)    Hypertension    Stroke (HCC)    No past surgical history on file. 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    PAIN:  Are you having pain? No  FALLS: Has patient fallen in last 6 months?  No  LIVING ENVIRONMENT: Lives with: lives with their family Lives in: House/apartment  PLOF:  Level of assistance:  Independent with ADLs   PATIENT GOALS to improve communication  OBJECTIVE:   DIAGNOSTIC FINDINGS: 07/01/2021 MRI BRAIN W WO CONTRAST  IMPRESSION:  1. Evolving early subacute left PCA distribution infarct as above,  with additional scattered small volume ischemic changes within the  left frontotemporal and right parietal lobe as above. Associated  mild petechial blood products at the left occipital lobe without  frank hemorrhagic transformation or significant mass effect.  2. Underlying mild chronic microvascular ischemic disease with  chronic right cerebellar infarct.    07/04/2021 CTA HEAD NECK W WO CONTRAST  IMPRESSION:  Evolving recent infarcts as seen on recent MRI. There is likely mild  petechial hemorrhage associated with the left parieto-occipital  infarct. No significant mass effect.  Plaque at the left greater than right ICA origins without  hemodynamically significant stenosis. Marked stenosis at the right  vertebral origin without apparent flow limitation.  Intracranial atherosclerosis involving anterior and posterior  circulations. No proximal intracranial vessel occlusion.    Today's Treatment:  Discussed exploring SGD trial with [employer]. With encouragement and education, pt agreeable to pursuing a trial of SGD. SLP filled out trial form on [employer] website with pt and mother's assistance.   Functional reading: Using [employer] app, pt completed Word ID Level 1 with 70% accuracy indep and Level 2 with 20% accuracy indep. Improving to 90% with paired auditory cueing.   Functional writing: Using [employer] App, pt completed Complete  the Phrase with 50% accuracy when app provided initial consonant, mod cueing for sequencing of letters during spelling.   Functional repetition and wordfinding: Pt repeated functional targets during reading tasks with ~50% accuracy; fair accuracy on remaining ~45% of targets. Pt provided verbal responses to fill in the blank task ~40%  of the time and fair-good accuracy. Pt answered 2/5 biographical questions verbally indep; 3/5 via writing and mod/max verbal cues for use of speech.  HEP: Play around on [employer] Aphasia App   Follow up with [employer] as needed  PATIENT EDUCATION: Education details: results of this assessment, ST POC Person educated: Patient and Caregiver , mother Education method: Explanation, Demonstration, and Verbal cues Education comprehension: needs further education   GOALS: Goals reviewed with patient? No  SHORT TERM GOALS: Target date: 10 sessions  Pt will participate in further assessment of functional reading, writing, and repetition.  Baseline:  Goal status: INITIAL  2.  Pt will follow 1-step directions with >50% accuracy with multimodal cues. Baseline:  Goal status: INITIAL  3.  Pt will repeat single words using a speech generating device in 50% of opportunities given frequent maximal verbal and maximal visual cues. Baseline:  Goal status: INITIAL  4. With Moderate A-Maximal A, patient will perform structured naming tasks with fair-good approximations 50% of the time.   Baseline:  Goal Status: INITIAL  5. Using multimodal communication and assistance as needed, pt will answer basic biographical questions with 60% accuracy.   Baseline:    Goal Status: INITIAL     LONG TERM GOALS: Target date: 03/20/23  With Min A, patient/family will demonstrate understanding of the following concepts: aphasia, spontaneous recovery, communication vs conversation, strengths/strategies to promote success in all communication settings. Baseline:  Goal status: INITIAL  2.  Using multimodal communication, pt will answer basic biographical questions in 8 out of 10 opportunities.  Baseline:  Goal status: INITIAL   ASSESSMENT:  CLINICAL IMPRESSION: Patient is a 47 y.o. female who was seen today for a speech-language treatment. On initial assessment, presents with moderate-severe  expressive > receptive aphasia. Pt's expressive communication is c/b severe word finding deficits, neologisms, phonemic and semantic paraphasias. Pt with islands of error free speech and well as automatic speech. Pt with concomitant apraxia of speech.This results in reduced express basic wants and needs. Pt also presents with receptive language deficits that impact her ability to answer basic yes/no questions and follow basic directions, although auditory comprehension is a relative strength. See details of tx session above.   OBJECTIVE IMPAIRMENTS include expressive language, receptive language, and aphasia.These impairments are limiting patient from return to work, managing medications, managing appointments, managing finances, household responsibilities, ADLs/IADLs, and effectively communicating at home and in community. Factors affecting potential to achieve goals and functional outcome are severity of impairments and time since stroke . Patient will benefit from skilled SLP services to address above impairments and improve overall function.  REHAB POTENTIAL: Good  PLAN: SLP FREQUENCY: 1-2x/week  SLP DURATION: 12 weeks  PLANNED INTERVENTIONS: Language facilitation, Cueing hierachy, Internal/external aids, Functional tasks, Multimodal communication approach, and SLP instruction and feedback   Clyde Canterbury, M.S., CCC-SLP Speech-Language Pathologist [place] [facility] [phone] Arnette Felts)  [place] [facility] MAIN [facility] SERVICES [address] [place], [place], [address] Phone: [phone]   Fax:  [phone]

## 2023-01-15 ENCOUNTER — Ambulatory Visit: Payer: Medicaid Other | Admitting: Physical Therapy

## 2023-01-15 ENCOUNTER — Ambulatory Visit: Payer: Medicaid Other

## 2023-01-15 DIAGNOSIS — Z8673 Personal history of transient ischemic attack (TIA), and cerebral infarction without residual deficits: Secondary | ICD-10-CM

## 2023-01-15 DIAGNOSIS — R4701 Aphasia: Secondary | ICD-10-CM

## 2023-01-15 DIAGNOSIS — R278 Other lack of coordination: Secondary | ICD-10-CM

## 2023-01-15 DIAGNOSIS — R41841 Cognitive communication deficit: Secondary | ICD-10-CM

## 2023-01-15 DIAGNOSIS — I63532 Cerebral infarction due to unspecified occlusion or stenosis of left posterior cerebral artery: Secondary | ICD-10-CM

## 2023-01-15 DIAGNOSIS — M6281 Muscle weakness (generalized): Secondary | ICD-10-CM

## 2023-01-15 DIAGNOSIS — R482 Apraxia: Secondary | ICD-10-CM

## 2023-01-15 DIAGNOSIS — R262 Difficulty in walking, not elsewhere classified: Secondary | ICD-10-CM

## 2023-01-15 DIAGNOSIS — R2681 Unsteadiness on feet: Secondary | ICD-10-CM

## 2023-01-15 NOTE — Therapy (Signed)
OUTPATIENT SPEECH LANGUAGE PATHOLOGY APHASIA TREATMENT   Patient Name: Cheyenne Martinez MRN: [id number] DOB:Oct 06, 1975, 47 y.o., female Today's Date: 01/15/2023  PCP: No PCP REFERRING PROVIDER: Ignacia Bayley, PA-C   End of Session - 01/15/23 0847     Visit Number 4    Number of Visits 24    Date for SLP Re-Evaluation 03/20/23    Authorization Type [place] Medicaid Prepaid [facility]    Authorization Time Period 11/4- 02/25/23 for 24 SLP visits    Authorization - Visit Number 4    Authorization - Number of Visits 24    Progress Note Due on Visit 20    SLP Start Time 0802    SLP Stop Time  0845    SLP Time Calculation (min) 43 min    Activity Tolerance Patient tolerated treatment well             Past Medical History:  Diagnosis Date   Diabetes mellitus without complication (HCC)    Hypertension    Stroke (HCC)    No past surgical history on file. 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    PAIN:  Are you having pain? No  FALLS: Has patient fallen in last 6 months?  No  LIVING ENVIRONMENT: Lives with: lives with their family Lives in: House/apartment  PLOF:  Level of assistance:  Independent with ADLs   PATIENT GOALS to improve communication  OBJECTIVE:   DIAGNOSTIC FINDINGS: 07/01/2021 MRI BRAIN W WO CONTRAST  IMPRESSION:  1. Evolving early subacute left PCA distribution infarct as above,  with additional scattered small volume ischemic changes within the  left frontotemporal and right parietal lobe as above. Associated  mild petechial blood products at the left occipital lobe without  frank hemorrhagic transformation or significant mass effect.  2. Underlying mild chronic microvascular ischemic disease with  chronic right cerebellar infarct.    07/04/2021 CTA HEAD NECK W WO CONTRAST  IMPRESSION:  Evolving recent infarcts as seen on recent MRI. There is likely mild  petechial hemorrhage associated with the left parieto-occipital  infarct. No significant mass effect.  Plaque at the left greater than right ICA origins without  hemodynamically significant stenosis. Marked stenosis at the right  vertebral origin without apparent flow limitation.  Intracranial atherosclerosis involving anterior and posterior  circulations. No proximal intracranial vessel occlusion.    Today's Treatment:  Received email from [employer] that device trial will be initiated shortly. Pt and mother made aware. Pt seemed excited to trial SGD.   Functional reading: Using Talk[employer] app, pt completed Word ID Level 1 with 70% accuracy indep and Level 2 with 30% accuracy indep. Improving to 90% with paired auditory cueing.   Functional writing: Using Talk[employer] App, pt completed Complete the Phrase with 60% accuracy when app provided initial  consonant, mod cueing for sequencing of letters during spelling.   Functional repetition and wordfinding: Pt repeated single word and 2-word phrases with 95% accuracy indep, 3-word phrases with 60% accuracy. 3-word phrases improved to >90% with hierarchical verbal, visual, and written cues.   HEP: Play around on Small Talk Aphasia App   Follow up  with [employer] as needed  PATIENT EDUCATION: Education details: results of this assessment, ST POC Person educated: Patient and Caregiver , mother Education method: Explanation, Demonstration, and Verbal cues Education comprehension: needs further education   GOALS: Goals reviewed with patient? No  SHORT TERM GOALS: Target date: 10 sessions  Pt will participate in further assessment of functional reading, writing, and repetition.  Baseline:  Goal status: INITIAL  2.  Pt will follow 1-step directions with >50% accuracy with multimodal cues. Baseline:  Goal status: INITIAL  3.  Pt will repeat single words using a speech generating device in 50% of opportunities given frequent maximal verbal and maximal visual cues. Baseline:  Goal status: INITIAL  4. With Moderate A-Maximal A, patient will perform structured naming tasks with fair-good approximations 50% of the time.   Baseline:  Goal Status: INITIAL  5. Using multimodal communication and assistance as needed, pt will answer basic biographical questions with 60% accuracy.   Baseline:    Goal Status: INITIAL     LONG TERM GOALS: Target date: 03/20/23  With Min A, patient/family will demonstrate understanding of the following concepts: aphasia, spontaneous recovery, communication vs conversation, strengths/strategies to promote success in all communication settings. Baseline:  Goal status: INITIAL  2.  Using multimodal communication, pt will answer basic biographical questions in 8 out of 10 opportunities.  Baseline:  Goal status: INITIAL   ASSESSMENT:  CLINICAL IMPRESSION: Patient is a 47 y.o. female who was seen today for a speech-language treatment. 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 Pt's expressive communication is c/b severe word finding deficits, neologisms, phonemic and semantic paraphasias. Pt with islands of error free speech and well as automatic speech. Pt  with concomitant apraxia of speech.This results in reduced express basic wants and needs. Pt also presents with receptive language deficits that impact her ability to answer basic yes/no questions and follow basic directions, although auditory comprehension is a relative strength. See details of tx session above.   OBJECTIVE IMPAIRMENTS include expressive language, receptive language, and aphasia.These impairments are limiting patient from return to work, managing medications, managing appointments, managing finances, household responsibilities, ADLs/IADLs, and effectively communicating at home and in community. Factors affecting potential to achieve goals and functional outcome are severity of impairments and time since stroke . Patient will benefit from skilled SLP services to address above impairments and improve overall function.  REHAB POTENTIAL: Good  PLAN: SLP FREQUENCY: 1-2x/week  SLP DURATION: 12 weeks  PLANNED INTERVENTIONS: Language facilitation, Cueing hierachy, Internal/external aids, Functional tasks, Multimodal communication approach, and SLP instruction and feedback   Clyde Canterbury, M.S., CCC-SLP Speech-Language Pathologist [facility] [phone] Arnette Felts)  [facility] MAIN [facility] SERVICES [address] [place], [place], [address] Phone: [phone]   Fax:  [phone]

## 2023-01-15 NOTE — Therapy (Signed)
OUTPATIENT PHYSICAL THERAPY NEURO EVALUATION   Patient Name: Cheyenne Martinez MRN: [id number] DOB:1975/07/30, 47 y.o., female Today's Date: 01/15/2023   PCP:   Cindi Carbon PROVIDER:  Ignacia Bayley PA  END OF SESSION:  PT End of Session - 01/15/23 0808     Visit Number 3    Number of Visits 24    Date for PT Re-Evaluation 03/20/23    Progress Note Due on Visit 10    PT Start Time 0845    PT Stop Time 0925    PT Time Calculation (min) 40 min    Equipment Utilized During Treatment Gait belt    Activity Tolerance Patient tolerated treatment well    Behavior During Therapy Impulsive;Restless             Past Medical History:  Diagnosis Date   Diabetes mellitus without complication (HCC)    Hypertension    Stroke (HCC)    No past surgical history on file. Patient Active Problem List   Diagnosis Date Noted   CVA (cerebral vascular accident) (HCC) 07/01/2021   New onset type 2 diabetes mellitus (HCC) 07/01/2021   Dyslipidemia 07/01/2021   Hypertensive urgency 07/01/2021    ONSET DATE:  07/01/2021  REFERRING JXBJ:Y78.29 (ICD-10-CM) - Personal history of transient ischemic attack (TIA), and cerebral infarction without residual deficits  THERAPY DIAG:   Muscle weakness (generalized)   Unsteadiness on feet   Hemiplegia and hemiparesis following cerebral infarction affecting  Right dominant side (HCC)   Other abnormalities of gait and mobility   Acute ischemic L ACA stroke (HCC)  Rationale for Evaluation and Treatment: Rehabilitation  SUBJECTIVE:                                                                                                                                                                                             SUBJECTIVE STATEMENT: Pt with moderate expressive aphasia. Reports no pain and having a good weekend, but it was  busy per mother. .  Pt accompanied by:  Mother  PERTINENT HISTORY: Pt is a 47 y/o female who had 2 episodes of stroke and has  received Physical therapy in the same OP clinic and has returned due to decline in functional mobility and  pain RLE with WB activities.  Pt with PMHx:   CVA x 2,   type 2 diabetes mellitus (HCC)   Hypertensive urgency   Dyslipidemia  PAIN:  Are you having pain? Yes: Pain location: 0/10 Pain description: sharp Aggravating factors: WB Relieving factors: Rest  PRECAUTIONS: Fall  RED FLAGS: None   WEIGHT BEARING RESTRICTIONS: No  FALLS: Has patient fallen in  last 6 months? No  LIVING ENVIRONMENT: Lives with: lives with their family Lives in: House/apartment Stairs: Yes: External: 4 steps; can reach both Has following equipment at home: None  PLOF: Independent with basic ADLs, Independent with household mobility without device, Independent with homemaking with ambulation, Independent with gait, Independent with transfers, and Leisure: driving  PATIENT GOALS: " I want to walk better and without falling and pain.   OBJECTIVE:  Note: Objective measures were completed at Evaluation unless otherwise noted.  DIAGNOSTIC FINDINGS: None for 2024 07/04/2021:  Evolving recent infarcts as seen on recent MRI. There is likely mild petechial hemorrhage associated with the left parieto-occipital infarct. No significant mass effect.   Plaque at the left greater than right ICA origins without hemodynamically significant stenosis. Marked stenosis at the right vertebral origin without apparent flow limitation.   Intracranial atherosclerosis involving anterior and posterior circulations. No proximal intracranial vessel occlusion.  COGNITION: Overall cognitive status: Within functional limits for tasks assessed   SENSATION: WFL  COORDINATION:   MUSCLE TONE: RLE: Moderate and Hypertonic  MUSCLE LENGTH: Hamstrings: Right 20 deg; Left WFL deg   POSTURE: rounded shoulders and R sided lean  LOWER EXTREMITY ROM:    PROM WFL AROM WFL    LOWER EXTREMITY MMT:    MMT Right Eval  Left Eval  Hip flexion 3/5 4-/5  Hip extension 3/5 4/5  Hip abduction 3/5 4/5  Hip adduction 4-/5 4/5  Hip internal rotation 3/5 4-/5  Hip external rotation 3/5 4-/5  Knee flexion 3+/5 4/5  Knee extension 3/5 4/5  Ankle dorsiflexion 3-/5 4/5  Ankle plantarflexion    Ankle inversion    Ankle eversion    (Blank rows = not tested)  BED MOBILITY:  Independent  TRANSFERS: Assistive device utilized: None  Sit to stand: Complete Independence Stand to sit: Complete Independence Chair to chair: Complete Independence Floor: SBA  RAMP:  Level of Assistance: CGA Assistive device utilized: None Ramp Comments: Pt needs CGA to ascend and Min to descend.   CURB:  Level of Assistance: CGA Assistive device utilized: None Curb Comments: Due to weakness, unsteady on feet and difficulty raising up concentrically.   STAIRS: Level of Assistance: CGA Stair Negotiation Technique: Step to Pattern Forwards with Single Rail on Left Number of Stairs: 4  Height of Stairs: 6 inch  Comments: Slow with heavy reliance on HR with one step a t a time.   GAIT: Gait pattern: step through pattern, decreased arm swing- Right, decreased step length- Right, decreased stance time- Left, decreased stride length, decreased ankle dorsiflexion- Right, and genu recurvatum- Right Distance walked: 18ft Assistive device utilized: None Level of assistance: SBA Comments: Impulsive, and needs VC for safety and proper technique  FUNCTIONAL TESTS:  8 reps in 30 seconds chair stand test Timed up and go (TUG): 13secs 10 meter walk test: 12 secs= 1.17m/sec Balance Test: Unable to Tandem stand, unable to SL stand, Rhomberg on NBOS x 20 secs  PATIENT SURVEYS:  LEFS 56/80 FOTO Complete FOTO next session.  TODAY'S TREATMENT:  DATE:   Completed Foto assessment with assistance from mother.    Gait without AD 2x 150 . Noted to have significant GR on the RLE as well as trendelenberg on the R hip with fatigue. .   Seated hip abduction RTB 2x 15 Seated HS curl RTB 2x 15 bil  Reverse Step over hurdle with stance leg in front of hurdle 2x 8 bil  Sit<>stand 2x 10 with BUE pushing from R thigh Sit<>stand with wedge under LLE to force WB on the R side. 2 x 10   CGA for safety while ambulating and in standing with tactile assist to force increased WB into the RLE and block GR with reverse step over hurdle   PATIENT EDUCATION: Education details: Safety, findings, rehab progression, significance of HEP Person educated: Patient and Parent Education method: Explanation Education comprehension: verbalized understanding, verbal cues required, and needs further education  HOME EXERCISE PROGRAM:   Access Code: [email or web address] URL: [email or web address] Date: 01/01/2023 Prepared by: Grier Rocher  Exercises - Seated Long Arc Quad  - 1 x daily - 7 x weekly - 3 sets - 10 reps - Seated Heel Slide  - 1 x daily - 7 x weekly - 3 sets - 10 reps - 2 hold - Sit to Stand  - 1 x daily - 7 x weekly - 3 sets - 10 reps - Seated Toe Raise  - 1 x daily - 7 x weekly - 3 sets - 15 reps - 2 hold - Standing Alternating Knee Flexion  - 1 x daily - 7 x weekly - 3 sets - 10 reps - 2 hold   GOALS: Goals reviewed with patient? Yes  SHORT TERM GOALS: Target date: 01/12/2023  Patient will be independent in home exercise program to improve strength/mobility for better functional independence with ADLs.  Baseline: HEP administered 12/26/2022 Goal status: INITIAL   LONG TERM GOALS: Target date: 03/22/2022  Patient will complete >12 times sit to stand test in 30 seconds indicating an increased LE strength and improved balance.  Baseline: 8 reps in 30 secs Goal status: INITIAL  2.  Pt will improve LEFS score >64/80  Baseline: 56/80 Goal status: INITIAL  3.  Pt will improve Hamstring Length to  <10 degrees to improve LE function and balance Baseline: 20 deg Hamstring lenght Goal status: INITIAL  4.  Pt will tandem stand for 30 secs without LOB.  Baseline: Unable Goal status: INITIAL  5.Pt will complete TUG in <10 secs to improve balance and safety with all functional mobility.  Baseline: 13secs Goal status: INITIAL  6.  Pt will complete 10 M gait speed in <9 secs to demonstrate safety with community level ambulation Baseline: 1.22m/sec Goal status: INITIAL  7.  Pt will improve FOTO score by 5 points  Baseline: 64 Goal status: INITIAL   ASSESSMENT:  CLINICAL IMPRESSION: Patient is a 47  y.o. female  who was seen today for physical therapy treatment for post stroke decline in functional mobility and pain in RLE. PT instructed pt in BLE strengthening with emphasis on activation of glute med for pelvic stability and HS activation to reduce force of GR with gait and tranfers. Pt demonstrated mild improvement in WB through RLE with assist from PT for sit<>stand transfer training. Pt will benefit from continued skilled PT interventions to address impairments and help pt achieve her goals.     OBJECTIVE IMPAIRMENTS: Abnormal gait, decreased activity tolerance, decreased balance, decreased coordination, decreased endurance, decreased mobility, difficulty walking, decreased ROM, decreased  strength, and decreased safety awareness.   ACTIVITY LIMITATIONS: lifting, bending, stairs, and locomotion level  PARTICIPATION LIMITATIONS: driving, shopping, community activity, and stairs  PERSONAL FACTORS: Age, Time since onset of injury/illness/exacerbation, and previous benefit from the skilled PT interventions  are also affecting patient's functional outcome.   REHAB POTENTIAL: Good  CLINICAL DECISION MAKING: Stable/uncomplicated  EVALUATION COMPLEXITY: Low  PLAN:  PT FREQUENCY: 1-2x/week  PT DURATION: 12 weeks  PLANNED INTERVENTIONS: 97164- PT Re-evaluation, 97110-Therapeutic  exercises, 97530- Therapeutic activity, [id number]- Neuromuscular re-education, 97535- Self Care, 29562- Manual therapy, [id number]- Gait training, [phone]- Orthotic Fit/training, Patient/Family education, Balance training, Stair training, Taping, Cryotherapy, and Moist heat  PLAN FOR NEXT SESSION:   Dynamic balance and strengthening for RLE. Pain management as indicated for R hip.   Grier Rocher PT, DPT  Physical Therapist - [place]  [facility]  9:34 AM 01/15/23

## 2023-01-22 ENCOUNTER — Ambulatory Visit: Payer: Medicaid Other | Admitting: Occupational Therapy

## 2023-01-22 ENCOUNTER — Encounter: Payer: Self-pay | Admitting: Occupational Therapy

## 2023-01-22 ENCOUNTER — Ambulatory Visit: Payer: Medicaid Other

## 2023-01-22 DIAGNOSIS — R278 Other lack of coordination: Secondary | ICD-10-CM

## 2023-01-22 DIAGNOSIS — Z8673 Personal history of transient ischemic attack (TIA), and cerebral infarction without residual deficits: Secondary | ICD-10-CM

## 2023-01-22 DIAGNOSIS — M6281 Muscle weakness (generalized): Secondary | ICD-10-CM

## 2023-01-22 DIAGNOSIS — R482 Apraxia: Secondary | ICD-10-CM

## 2023-01-22 DIAGNOSIS — R4701 Aphasia: Secondary | ICD-10-CM

## 2023-01-22 DIAGNOSIS — R262 Difficulty in walking, not elsewhere classified: Secondary | ICD-10-CM | POA: Diagnosis not present

## 2023-01-22 DIAGNOSIS — R41841 Cognitive communication deficit: Secondary | ICD-10-CM

## 2023-01-22 NOTE — Therapy (Signed)
OUTPATIENT SPEECH LANGUAGE PATHOLOGY APHASIA TREATMENT   Patient Name: Cheyenne Martinez MRN: [id number] DOB:[birth year], 47 y.o., female Today's Date: 01/22/2023  PCP: No PCP REFERRING PROVIDER: Ignacia Bayley, PA-C   End of Session - 01/22/23 1145     Visit Number 5    Number of Visits 24    Authorization Type [place] Medicaid Prepaid [employer]    Authorization Time Period 11/4- 02/25/23 for 24 SLP visits    Authorization - Visit Number 5    Authorization - Number of Visits 24    Progress Note Due on Visit 20    SLP Start Time 0845    SLP Stop Time  0930    SLP Time Calculation (min) 45 min    Activity Tolerance Patient tolerated treatment well             Past Medical History:  Diagnosis Date   Diabetes mellitus without complication (HCC)    Hypertension    Stroke (HCC)    No past surgical history on file. 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    PAIN:  Are you having pain? No  FALLS: Has patient fallen in last 6 months?  No  LIVING ENVIRONMENT: Lives with: lives with their family Lives in: House/apartment  PLOF:  Level of assistance: Independent with ADLs   PATIENT GOALS to  improve communication  OBJECTIVE:   DIAGNOSTIC FINDINGS: 07/01/2021 MRI BRAIN W WO CONTRAST  IMPRESSION:  1. Evolving early subacute left PCA distribution infarct as above,  with additional scattered small volume ischemic changes within the  left frontotemporal and right parietal lobe as above. Associated  mild petechial blood products at the left occipital lobe without  frank hemorrhagic transformation or significant mass effect.  2. Underlying mild chronic microvascular ischemic disease with  chronic right cerebellar infarct.    07/04/2021 CTA HEAD NECK W WO CONTRAST  IMPRESSION:  Evolving recent infarcts as seen on recent MRI. There is likely mild  petechial hemorrhage associated with the left parieto-occipital  infarct. No significant mass effect.  Plaque at the left greater than right ICA origins without  hemodynamically significant stenosis. Marked stenosis at the right  vertebral origin without apparent flow limitation.  Intracranial atherosclerosis involving anterior and posterior  circulations. No proximal intracranial vessel occlusion.    Today's Treatment:  Pt received SGD from [employer] and brought to today's session. Basic orientation to the device initiated including power, charging, and basic functions. With pt/mother's input, SLP added cards for additional personal information (e.g. correct telephone numbers, children's names, favorite restaurants). Pt eager to utilize device during today's session. Pt utilized to answer basic biographical questions with min A. Pt with tendency to use dominant/affected UE which reduced pt's ability to select correct icon due to weakness/dysmetria. Encouraged pt to utilize  non-dominant hand which increased accuracy.   HEP: Explore SGD  PATIENT EDUCATION: Education details:as above Person educated: Patient and Engineer, structural , mother Education method: Explanation, Demonstration, and Verbal cues Education comprehension: needs further  education   GOALS: Goals reviewed with patient? No  SHORT TERM GOALS: Target date: 10 sessions  Pt will participate in further assessment of functional reading, writing, and repetition.  Baseline:  Goal status: INITIAL  2.  Pt will follow 1-step directions with >50% accuracy with multimodal cues. Baseline:  Goal status: INITIAL  3.  Pt will repeat single words using a speech generating device in 50% of opportunities given frequent maximal verbal and maximal visual cues. Baseline:  Goal status: INITIAL  4. With Moderate A-Maximal A, patient will perform structured naming tasks with fair-good approximations 50% of the time.   Baseline:  Goal Status: INITIAL  5. Using multimodal communication and assistance as needed, pt will answer basic biographical questions with 60% accuracy.   Baseline:    Goal Status: INITIAL     LONG TERM GOALS: Target date: 03/20/23  With Min A, patient/family will demonstrate understanding of the following concepts: aphasia, spontaneous recovery, communication vs conversation, strengths/strategies to promote success in all communication settings. Baseline:  Goal status: INITIAL  2.  Using multimodal communication, pt will answer basic biographical questions in 8 out of 10 opportunities.  Baseline:  Goal status: INITIAL   ASSESSMENT:  CLINICAL IMPRESSION: Patient is a 47 y.o. female who was seen today for a speech-language treatment. 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 Pt's expressive communication is c/b severe word finding deficits, neologisms, phonemic and semantic paraphasias. Pt with islands of error free speech and well as automatic speech. Pt with concomitant apraxia of speech.This results in reduced express basic wants and needs. Pt also presents with receptive language deficits that impact her ability to answer basic yes/no questions and follow basic directions, although auditory comprehension  is a relative strength. See details of tx session above.   OBJECTIVE IMPAIRMENTS include expressive language, receptive language, and aphasia.These impairments are limiting patient from return to work, managing medications, managing appointments, managing finances, household responsibilities, ADLs/IADLs, and effectively communicating at home and in community. Factors affecting potential to achieve goals and functional outcome are severity of impairments and time since stroke . Patient will benefit from skilled SLP services to address above impairments and improve overall function.  REHAB POTENTIAL: Good  PLAN: SLP FREQUENCY: 1-2x/week  SLP DURATION: 12 weeks  PLANNED INTERVENTIONS: Language facilitation, Cueing hierachy, Internal/external aids, Functional tasks, Multimodal communication approach, and SLP instruction and feedback   Clyde Canterbury, M.S., CCC-SLP Speech-Language Pathologist [place] [facility] [phone] Arnette Felts)  [facility] MAIN [facility] SERVICES [address] [place], [place], [address] Phone: [phone]   Fax:  [phone]

## 2023-01-22 NOTE — Therapy (Addendum)
OUTPATIENT OCCUPATIONAL THERAPY NEURO EVALUATION  Patient Name: Cheyenne Martinez MRN: [id number] DOB:[birth year], 47 y.o., female Today's Date: 01/22/2023  PCP: Dr. Burnadette Pop REFERRING PROVIDER: Joellyn Haff, PA  END OF SESSION:  OT End of Session - 01/22/23 0813     Visit Number 2    Number of Visits 24    Date for OT Re-Evaluation 04/03/23    Authorization Time Period Reporting period beginning 01/09/23    Progress Note Due on Visit 10    OT Start Time 0803    OT Stop Time 0845    OT Time Calculation (min) 42 min    Activity Tolerance Patient tolerated treatment well    Behavior During Therapy [facility] for tasks assessed/performed            Past Medical History:  Diagnosis Date   Diabetes mellitus without complication (HCC)    Hypertension    Stroke [facility])    History reviewed. No pertinent surgical history. Patient Active Problem List   Diagnosis Date Noted   CVA (cerebral vascular accident) (HCC) 07/01/2021   New onset type 2 diabetes mellitus (HCC) 07/01/2021   Dyslipidemia 07/01/2021   Hypertensive urgency 07/01/2021    ONSET DATE: 07/01/21  REFERRING DIAG: History of Cva, muscle weakness, lack of coordination  THERAPY DIAG:  Muscle weakness (generalized)  Other lack of coordination  History of CVA (cerebrovascular accident)  Rationale for Evaluation and Treatment: Rehabilitation  SUBJECTIVE:  SUBJECTIVE STATEMENT: Pt verbalizes wanting to improve strength and increase use of her R arm. Pt accompanied by:  Mother, Cheyenne Martinez  PERTINENT HISTORY:  Pt known to this OT as pt participated in OT in this clinic after suffering a CVA in June of '23.   Per this writer's OT d/c note on 11/09/21: Pt was seen by OT for 18 sessions to address visual and RUE deficits following L PCA stroke. Pt has made strong functional gains, as noted by increased FOTO score from 36 at eval to 60 at discharge. Pt engages her RUE into self care tasks as a fair-good stabilizer, but pt relies on  L non-dominant arm for tasks which require strength and dexterity. Pt has increased attention to R side, requiring min vc on occasion for R hand proprioception. HEP was reviewed this date with pt/mother acknowledging understanding and benefits of consistent/daily participation to help pt continue to improve use of the RUE. OT encouraged new therapy referral in the new year if pt wishes to continue to focus on residual deficits post stroke. Discharge completed this date d/t visit limitations with insurance within this calendar year.   PRECAUTIONS: Fall  WEIGHT BEARING RESTRICTIONS: No  PAIN:  Are you having pain? No  FALLS: Has patient fallen in last 6 months? No  LIVING ENVIRONMENT: Lives with: lives with their family, Adult daughter 60 years old, and 2 sons 63 and 66 years old; mom lives 5 min away Lives in: house Stairs: 4 steps, no rail Has following equipment at home: None (walk in shower and standard toilet)  PLOF: Independent prior to CVA  PATIENT GOALS: improve speech, use of arm, improve leg strength  OBJECTIVE:  Note: Objective measures were completed at Evaluation unless otherwise noted.  HAND DOMINANCE: Right  ADLs: Overall ADLs: Uses RUE as a fair stabilizer to non-dominant L arm Transfers/ambulation related to ADLs:  Eating: cuts food with L hand cut only Grooming: uses L hand to manipulate grooming utensils; requires assist for bimanual tasks such as putting up hair UB Dressing: indep to don  a sports bra; unable to manage clothing fasteners LB Dressing: indep to don LB clothing including slip on shoes and elastic waist paints; unable to manage clothing fasteners Toileting: indep with non-dominant hand Bathing: modified indep with limited use of the R dominant hand Tub Shower transfers: indep (walk in shower) Equipment: none  IADLs: Shopping: pt now driving, indep Light housekeeping: shared between pt and children Meal Prep: pt cooks daily, minimal use of the R  dominant arm other than to stabilize meal prep items Community mobility: indep without use of AD Medication management: indep with pill Retail buyer: indep on phone  Handwriting: Increased time  MOBILITY STATUS:  R sided hemiparesis; able to amb community distances without AD  POSTURE COMMENTS:  R sided hemiparesis Sitting balance: Moves/returns truncal midpoint >2 inches in all planes  ACTIVITY TOLERANCE: Activity tolerance: WFL for tasks assessed  FUNCTIONAL OUTCOME MEASURES: FOTO: 61; predicted 62  UPPER EXTREMITY ROM:   Active ROM Right eval Left eval  Shoulder flexion 70   Shoulder abduction 70   Shoulder adduction    Shoulder extension    Shoulder internal rotation Hand to iliac crest    Shoulder external rotation [facility]   Elbow flexion    Elbow extension    Wrist flexion    Wrist extension    Wrist ulnar deviation    Wrist radial deviation    Wrist pronation    Wrist supination 72   (Blank rows = not tested)  UPPER EXTREMITY MMT:     MMT Right eval Left eval  Shoulder flexion 3- 4+  Shoulder abduction 3+ (within available range) 4+  Shoulder adduction    Shoulder extension    Shoulder internal rotation    Shoulder external rotation    Middle trapezius    Lower trapezius    Elbow flexion 4- 4+  Elbow extension 4+ 4+  Wrist flexion 4- 4+  Wrist extension 4- 4+  Wrist ulnar deviation    Wrist radial deviation    Wrist pronation    Wrist supination    (Blank rows = not tested)  HAND FUNCTION: Grip strength: Right: 19 lbs; Left: 69 lbs, Lateral pinch: Right: 5 lbs, Left: 21 lbs, and 3 point pinch: Right: 6 lbs, Left: 21 lbs  COORDINATION: 9 Hole Peg test: Right: 3 pegs placed in 2 min 21 sec  sec; Left: 22 sec  SENSATION: Light touch: Impaired   EDEMA: No visible edema  MUSCLE TONE: RUE: Hypertonic  COGNITION: Overall cognitive status: Impaired; expressive and receptive aphasia  VISION ASSESSMENT: Not tested; limited by  time constraints d/t pt arriving late to eval; will assess as needed in upcoming sessions/within functional contexts   PERCEPTION: Not tested this date; will assess in upcoming sessions  PRAXIS: Impaired: Motor planning  OBSERVATIONS: Pt appears happy to be back to OT to begin a 2nd round of rehab, and motivated to work on strengthening the R side for improving use for daily tasks.   TODAY'S TREATMENT:  DATE: 01/22/23:  Therapeutic Exercise: Pt performed hand strengthening with yellow theraputty, cues for proper technique. Pt worked on gross grip loop, lateral pinch, 3pt. pinch, gross digit extension, digit extension table spread, digit abduction loop, single digit extension loop, thumb opposition, and lumbical ex. Provided handout for HEP and reviewed exercises with family. Pt worked on Marketing executive in the R hand for lateral pinch using yellow, red, green, blue, and black resistive clips to place and remove from vertical and horizontal dowel. Increased time to achieve active thumb extension to grasp clips.   Neuromuscular Re-education: Pt worked on grasping washers of various sizes from magnetic bowl and threaded onto vertical dowel - requires dowel placed at ~45* angle 2/2 R shoulder AROM limitations. Removes 10 washers from bowl and places on dowel with occasional dropping suspect r/t sensation deficits. Pt demonstrates good frustration tolerance throughout. Removes 8 washers from tabletop and places onto dowel held in L hand. Pt completed the Box and Block test moving 27 blocks with L hand and 14 blocks with R hand.   PATIENT EDUCATION: Education details: OT role, goals, poc Person educated: Patient and Parent Education method: Explanation Education comprehension: verbalized understanding  HOME EXERCISE PROGRAM: To be initiated in upcoming  sessions  GOALS: Goals reviewed with patient? Yes  SHORT TERM GOALS: Target date: 02/20/23  Pt will be indep to perform HEP for improving RUE strength and coordination for daily tasks. Baseline:  Eval: Not yet initiated Goal status: INITIAL  LONG TERM GOALS: Target date: 04/03/23  Pt will increase FOTO score to 65 or better to indicate improvement in self perceived functional use of the R arm with daily tasks. Baseline: Eval: 61; predicted 62 Goal status: INITIAL  2.  Pt will increase R grip strength by 5-7 lbs in order to hold and transport ADL supplies in R dominant hand with increased confidence. Baseline: Eval: R grip strength 19 lbs. Goal status: INITIAL  3.  Pt will increase R hand FMC/dexterity skills to enable pt to engage the R hand in manipulation of clothing fasteners.  Baseline: Eval: Assist needed with clothing fasteners; R 9 hole peg test: able to place 3 pegs in 2 min 21 sec. Goal status: INITIAL  4.  Pt will increase active R shoulder flexion and abd by 20 or more degrees in order to bimanually reach for items in closet at shoulder level.   Baseline: Eval: R shoulder flexion and abd 70*; pt uses L arm to reach for items at or above shoulder level. Goal status: INITIAL  ASSESSMENT:  CLINICAL IMPRESSION: Removes 10 washers from magnetized bowl using R hand to place on dowel with pt dropping 3 citing sensation deficits. Provided yellow theraputty and reviewed HEP with MIN cues for technique. Completed all difficulties of resistive clips to remove from dowel, increased time to achieve full thumb extension. Pt currently using RUE mostly as a fair stabilizer to the L, and will benefit from additional skilled OT to target strengthening and coordination to better engage the RUE into daily tasks.   PERFORMANCE DEFICITS: in functional skills including ADLs, IADLs, coordination, dexterity, ROM, strength, flexibility, Fine motor control, Gross motor control, mobility, balance, body  mechanics, decreased knowledge of use of DME, vision, and UE functional use, cognitive skills including thought and understand, and psychosocial skills including coping strategies, environmental adaptation, habits, interpersonal interactions, and routines and behaviors.   IMPAIRMENTS: are limiting patient from ADLs, IADLs, work, leisure, and social participation.   CO-MORBIDITIES: has co-morbidities such as global aphasia,  R sided hemiparesis, HTN  that affects occupational performance. Patient will benefit from skilled OT to address above impairments and improve overall function.  MODIFICATION OR ASSISTANCE TO COMPLETE EVALUATION: No modification of tasks or assist necessary to complete an evaluation.  OT OCCUPATIONAL PROFILE AND HISTORY: Problem focused assessment: Including review of records relating to presenting problem.  CLINICAL DECISION MAKING: Moderate - several treatment options, min-mod task modification necessary  REHAB POTENTIAL: Good  EVALUATION COMPLEXITY: Moderate    PLAN:  OT FREQUENCY: 1-2x/week  OT DURATION: 12 weeks  PLANNED INTERVENTIONS: 97168 OT Re-evaluation, 97535 self care/ADL training, 16109 therapeutic exercise, 97530 therapeutic activity, 97112 neuromuscular re-education, 97140 manual therapy, 97116 gait training, 60454 moist heat, 97010 cryotherapy, passive range of motion, balance training, functional mobility training, visual/perceptual remediation/compensation, psychosocial skills training, energy conservation, coping strategies training, patient/family education, and DME and/or AE instructions  RECOMMENDED OTHER SERVICES: Pt has orders for SLP and PT  CONSULTED AND AGREED WITH PLAN OF CARE: Patient and family member/caregiver  PLAN FOR NEXT SESSION: See above  Kathie Dike, M.S. OTR/L  01/22/23, 8:30 AM  ascom [phone]    Presley Raddle, OT 01/22/2023, 8:23 AM

## 2023-01-29 NOTE — Therapy (Signed)
 OUTPATIENT PHYSICAL THERAPY NEURO TREATMENT   Patient Name: Cheyenne Martinez MRN: [id number] DOB:[birth year], 47 y.o., female Today's Date: 01/30/2023   PCP:  No PCP  REFERRING PROVIDER:  Suzzane Charleston PA  END OF SESSION:  PT End of Session - 01/30/23 1014     Visit Number 4    Number of Visits 24    Date for PT Re-Evaluation 03/20/23    Progress Note Due on Visit 10    PT Start Time 1015    PT Stop Time 1057    PT Time Calculation (min) 42 min    Equipment Utilized During Treatment Gait belt    Activity Tolerance Patient tolerated treatment well    Behavior During Therapy Impulsive;Restless              Past Medical History:  Diagnosis Date   Diabetes mellitus without complication (HCC)    Hypertension    Stroke [facility])    History reviewed. No pertinent surgical history. Patient Active Problem List   Diagnosis Date Noted   CVA (cerebral vascular accident) (HCC) 07/01/2021   New onset type 2 diabetes mellitus (HCC) 07/01/2021   Dyslipidemia 07/01/2021   Hypertensive urgency 07/01/2021    ONSET DATE:  07/01/2021  REFERRING IPJH:S13.26 (ICD-10-CM) - Personal history of transient ischemic attack (TIA), and cerebral infarction without residual deficits  THERAPY DIAG:   Muscle weakness (generalized)   Unsteadiness on feet   Hemiplegia and hemiparesis following cerebral infarction affecting  Right dominant side (HCC)   Other abnormalities of gait and mobility   Acute ischemic L ACA stroke (HCC)  Rationale for Evaluation and Treatment: Rehabilitation  SUBJECTIVE:                                                                                                                                                                                             SUBJECTIVE STATEMENT:   Patient denies any pain and reports having a good weekend. Pt accompanied by:  Mother  PERTINENT HISTORY: Pt is a 47 y/o female who had 2 episodes of stroke and has received Physical therapy in  the same OP clinic and has returned due to decline in functional mobility and  pain RLE with WB activities.  Pt with PMHx:   CVA x 2,   type 2 diabetes mellitus (HCC)   Hypertensive urgency   Dyslipidemia  PAIN:  Are you having pain? Yes: Pain location: 0/10 Pain description: sharp Aggravating factors: WB Relieving factors: Rest  PRECAUTIONS: Fall  RED FLAGS: None   WEIGHT BEARING RESTRICTIONS: No  FALLS: Has patient fallen in last 6 months? No  LIVING ENVIRONMENT:  Lives with: lives with their family Lives in: House/apartment Stairs: Yes: External: 4 steps; can reach both Has following equipment at home: None  PLOF: Independent with basic ADLs, Independent with household mobility without device, Independent with homemaking with ambulation, Independent with gait, Independent with transfers, and Leisure: driving  PATIENT GOALS:  I want to walk better and without falling and pain.   OBJECTIVE:  Note: Objective measures were completed at Evaluation unless otherwise noted.  DIAGNOSTIC FINDINGS: None for 2024 07/04/2021:  Evolving recent infarcts as seen on recent MRI. There is likely mild petechial hemorrhage associated with the left parieto-occipital infarct. No significant mass effect.   Plaque at the left greater than right ICA origins without hemodynamically significant stenosis. Marked stenosis at the right vertebral origin without apparent flow limitation.   Intracranial atherosclerosis involving anterior and posterior circulations. No proximal intracranial vessel occlusion.  COGNITION: Overall cognitive status: Within functional limits for tasks assessed   SENSATION: WFL  COORDINATION:   MUSCLE TONE: RLE: Moderate and Hypertonic  MUSCLE LENGTH: Hamstrings: Right 20 deg; Left WFL deg   POSTURE: rounded shoulders and R sided lean  LOWER EXTREMITY ROM:    PROM WFL AROM WFL    LOWER EXTREMITY MMT:    MMT Right Eval Left Eval  Hip flexion 3/5 4-/5   Hip extension 3/5 4/5  Hip abduction 3/5 4/5  Hip adduction 4-/5 4/5  Hip internal rotation 3/5 4-/5  Hip external rotation 3/5 4-/5  Knee flexion 3+/5 4/5  Knee extension 3/5 4/5  Ankle dorsiflexion 3-/5 4/5  Ankle plantarflexion    Ankle inversion    Ankle eversion    (Blank rows = not tested)  BED MOBILITY:  Independent  TRANSFERS: Assistive device utilized: None  Sit to stand: Complete Independence Stand to sit: Complete Independence Chair to chair: Complete Independence Floor: SBA  RAMP:  Level of Assistance: CGA Assistive device utilized: None Ramp Comments: Pt needs CGA to ascend and Min to descend.   CURB:  Level of Assistance: CGA Assistive device utilized: None Curb Comments: Due to weakness, unsteady on feet and difficulty raising up concentrically.   STAIRS: Level of Assistance: CGA Stair Negotiation Technique: Step to Pattern Forwards with Single Rail on Left Number of Stairs: 4  Height of Stairs: 6 inch  Comments: Slow with heavy reliance on HR with one step a t a time.   GAIT: Gait pattern: step through pattern, decreased arm swing- Right, decreased step length- Right, decreased stance time- Left, decreased stride length, decreased ankle dorsiflexion- Right, and genu recurvatum- Right Distance walked: 79ft Assistive device utilized: None Level of assistance: SBA Comments: Impulsive, and needs VC for safety and proper technique  FUNCTIONAL TESTS:  8 reps in 30 seconds chair stand test Timed up and go (TUG): 13secs 10 meter walk test: 12 secs= 1.45m/sec Balance Test: Unable to Tandem stand, unable to SL stand, Rhomberg on NBOS x 20 secs  PATIENT SURVEYS:  LEFS 56/80 FOTO Complete FOTO next session.  TODAY'S TREATMENT:  DATE:     Gait without AD x 300 . Noted to have significant GR on the RLE as well as trendelenberg on  the R hip with fatigue. .   Seated hip abduction RTB 2x 15 each LE  Seated marching RTB 2 x 10 each LE  Sit to stand 2 x 10 with B UE pushing from R thigh  Sit to stand 2 x 10 with L LE on airex and B UE pushing from R thigh - patient with difficulty, requiring momentum to stand  Gait without AD x 300 and CGA-supervision  Seated LAQ (L 3# AW, R no weight) x 15 each  Step taps onto 6 step with single UE support x 15 each   CGA for safety while ambulating and in standing with tactile assist to force increased WB into the RLE and block GR with reverse step over hurdle   PATIENT EDUCATION: Education details: Safety, findings, rehab progression, significance of HEP Person educated: Patient and Parent Education method: Explanation Education comprehension: verbalized understanding, verbal cues required, and needs further education  HOME EXERCISE PROGRAM:   Access Code: [email or web address] URL: [email or web address] Date: 01/01/2023 Prepared by: Massie Dollar  Exercises - Seated Long Arc Quad  - 1 x daily - 7 x weekly - 3 sets - 10 reps - Seated Heel Slide  - 1 x daily - 7 x weekly - 3 sets - 10 reps - 2 hold - Sit to Stand  - 1 x daily - 7 x weekly - 3 sets - 10 reps - Seated Toe Raise  - 1 x daily - 7 x weekly - 3 sets - 15 reps - 2 hold - Standing Alternating Knee Flexion  - 1 x daily - 7 x weekly - 3 sets - 10 reps - 2 hold   GOALS: Goals reviewed with patient? Yes  SHORT TERM GOALS: Target date: 01/12/2023  Patient will be independent in home exercise program to improve strength/mobility for better functional independence with ADLs.  Baseline: HEP administered 12/26/2022 Goal status: INITIAL   LONG TERM GOALS: Target date: 03/22/2022  Patient will complete >12 times sit to stand test in 30 seconds indicating an increased LE strength and improved balance.  Baseline: 8 reps in 30 secs Goal status: INITIAL  2.  Pt will improve LEFS score >64/80  Baseline: 56/80 Goal  status: INITIAL  3.  Pt will improve Hamstring Length to <10 degrees to improve LE function and balance Baseline: 20 deg Hamstring lenght Goal status: INITIAL  4.  Pt will tandem stand for 30 secs without LOB.  Baseline: Unable Goal status: INITIAL  5.Pt will complete TUG in <10 secs to improve balance and safety with all functional mobility.  Baseline: 13secs Goal status: INITIAL  6.  Pt will complete 10 M gait speed in <9 secs to demonstrate safety with community level ambulation Baseline: 1.61m/sec Goal status: INITIAL  7.  Pt will improve FOTO score by 5 points  Baseline: 64 Goal status: INITIAL   ASSESSMENT:  CLINICAL IMPRESSION:   Patient arrives to treatment session motivated to participate. Session focused on BLE strengthening with focus on R LE activation. Continues to demonstrate GR during gait and sit to stand requiring tactile cues and facilitation. Pt will benefit from continued skilled PT interventions to address impairments and help pt achieve her goals.   OBJECTIVE IMPAIRMENTS: Abnormal gait, decreased activity tolerance, decreased balance, decreased coordination, decreased endurance, decreased mobility, difficulty walking, decreased ROM, decreased strength, and decreased safety awareness.  ACTIVITY LIMITATIONS: lifting, bending, stairs, and locomotion level  PARTICIPATION LIMITATIONS: driving, shopping, community activity, and stairs  PERSONAL FACTORS: Age, Time since onset of injury/illness/exacerbation, and previous benefit from the skilled PT interventions  are also affecting patient's functional outcome.   REHAB POTENTIAL: Good  CLINICAL DECISION MAKING: Stable/uncomplicated  EVALUATION COMPLEXITY: Low  PLAN:  PT FREQUENCY: 1-2x/week  PT DURATION: 12 weeks  PLANNED INTERVENTIONS: 97164- PT Re-evaluation, 97110-Therapeutic exercises, 97530- Therapeutic activity, [id number]- Neuromuscular re-education, 97535- Self Care, 02859- Manual therapy, [id number]- Gait  training, [phone]- Orthotic Fit/training, Patient/Family education, Balance training, Stair training, Taping, Cryotherapy, and Moist heat  PLAN FOR NEXT SESSION:   Dynamic balance and strengthening for RLE. Pain management as indicated for R hip.   Maryanne Finder, PT, DPT  Physical Therapist - [facility]  12:38 PM 01/30/23

## 2023-01-30 ENCOUNTER — Ambulatory Visit: Payer: Medicaid Other

## 2023-01-30 ENCOUNTER — Ambulatory Visit: Payer: Medicaid Other | Admitting: Occupational Therapy

## 2023-01-30 DIAGNOSIS — M6281 Muscle weakness (generalized): Secondary | ICD-10-CM

## 2023-01-30 DIAGNOSIS — R278 Other lack of coordination: Secondary | ICD-10-CM

## 2023-01-30 DIAGNOSIS — R4701 Aphasia: Secondary | ICD-10-CM

## 2023-01-30 DIAGNOSIS — R262 Difficulty in walking, not elsewhere classified: Secondary | ICD-10-CM

## 2023-01-30 DIAGNOSIS — Z8673 Personal history of transient ischemic attack (TIA), and cerebral infarction without residual deficits: Secondary | ICD-10-CM

## 2023-01-30 DIAGNOSIS — R41841 Cognitive communication deficit: Secondary | ICD-10-CM

## 2023-01-30 DIAGNOSIS — R482 Apraxia: Secondary | ICD-10-CM

## 2023-01-30 NOTE — Therapy (Signed)
 OUTPATIENT SPEECH LANGUAGE PATHOLOGY APHASIA TREATMENT   Patient Name: Cheyenne Martinez MRN: [id number] DOB:Oct 22, 1975, 47 y.o., female Today's Date: 01/30/2023  PCP: No PCP REFERRING PROVIDER: Lamar Manus, PA-C   End of Session - 01/30/23 1348     Visit Number 6    Number of Visits 24    Date for SLP Re-Evaluation 03/20/23    Authorization Type [place] Medicaid [employer]    Authorization Time Period 11/4- 02/25/23 for 24 SLP visits    Authorization - Visit Number 6    Authorization - Number of Visits 24    Progress Note Due on Visit 10    SLP Start Time 0930    SLP Stop Time  1015    SLP Time Calculation (min) 45 min    Activity Tolerance Patient tolerated treatment well             Past Medical History:  Diagnosis Date   Diabetes mellitus without complication (HCC)    Hypertension    Stroke (HCC)    No past surgical history on file. 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    PAIN:  Are you having pain? No  FALLS: Has patient fallen in last 6 months?  No  LIVING ENVIRONMENT: Lives with: lives with their family Lives in: House/apartment  PLOF:  Level of assistance:  Independent with ADLs   PATIENT GOALS to improve communication  OBJECTIVE:   DIAGNOSTIC FINDINGS: 07/01/2021 MRI BRAIN W WO CONTRAST  IMPRESSION:  1. Evolving early subacute left PCA distribution infarct as above,  with additional scattered small volume ischemic changes within the  left frontotemporal and right parietal lobe as above. Associated  mild petechial blood products at the left occipital lobe without  frank hemorrhagic transformation or significant mass effect.  2. Underlying mild chronic microvascular ischemic disease with  chronic right cerebellar infarct.    07/04/2021 CTA HEAD NECK W WO CONTRAST  IMPRESSION:  Evolving recent infarcts as seen on recent MRI. There is likely mild  petechial hemorrhage associated with the left parieto-occipital  infarct. No significant mass effect.  Plaque at the left greater than right ICA origins without  hemodynamically significant stenosis. Marked stenosis at the right  vertebral origin without apparent flow limitation.  Intracranial atherosclerosis involving anterior and posterior  circulations. No proximal intracranial vessel occlusion.    Today's Treatment:  Pt received SGD from [employer] and brought to today's session. Reviewed basic orientation to the device initiated including power, charging, and basic functions. Brief conference call completed with [employer] rep who assisted with changing size of icons to allow pt to more readily access with dominant/affected UE. Pt utilized to answer basic biographical questions with min A. With assistance from mother and SLP, pt completed x2 customization surveys including pt's interests and  communication needs/preferences. This will be forwarded to the [facility] customization team. Of note, pt with increased frequency of intelligible verbal output with use of SGD. Pt with tendency to repeat selected items with fair-good approximations ~50% of the time. Pt continues to appear eager to  utilize communication device.  PATIENT EDUCATION: Education details:as above Person educated: Patient and Engineer, Structural , mother Education method: Explanation, Demonstration, and Verbal cues Education comprehension: needs further education   GOALS: Goals reviewed with patient? No  SHORT TERM GOALS: Target date: 10 sessions  Pt will participate in further assessment of functional reading, writing, and repetition.  Baseline:  Goal status: INITIAL  2.  Pt will follow 1-step directions with >50% accuracy with multimodal cues. Baseline:  Goal status: INITIAL  3.  Pt will repeat single words using a speech generating device in 50% of opportunities given frequent maximal verbal and maximal visual cues. Baseline:  Goal status: INITIAL  4. With Moderate A-Maximal A, patient will perform structured naming tasks with fair-good approximations 50% of the time.   Baseline:  Goal Status: INITIAL  5. Using multimodal communication and assistance as needed, pt will answer basic biographical questions with 60% accuracy.   Baseline:    Goal Status: INITIAL     LONG TERM GOALS: Target date: 03/20/23  With Min A, patient/family will demonstrate understanding of the following concepts: aphasia, spontaneous recovery, communication vs conversation, strengths/strategies to promote success in all communication settings. Baseline:  Goal status: INITIAL  2.  Using multimodal communication, pt will answer basic biographical questions in 8 out of 10 opportunities.  Baseline:  Goal status: INITIAL   ASSESSMENT:  CLINICAL IMPRESSION: Patient is a 47 y.o. female who was seen today for a speech-language treatment. 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 Pt's expressive communication is c/b severe word finding deficits, neologisms, phonemic and semantic paraphasias. Pt with islands of error free speech and well as automatic speech. Pt with concomitant apraxia  of speech.This results in reduced express basic wants and needs. Pt also presents with receptive language deficits that impact her ability to answer basic yes/no questions and follow basic directions, although auditory comprehension is a relative strength. See details of tx session above.   OBJECTIVE IMPAIRMENTS include expressive language, receptive language, and aphasia.These impairments are limiting patient from return to work, managing medications, managing appointments, managing finances, household responsibilities, ADLs/IADLs, and effectively communicating at home and in community. Factors affecting potential to achieve goals and functional outcome are severity of impairments and time since stroke . Patient will benefit from skilled SLP services to address above impairments and improve overall function.  REHAB POTENTIAL: Good  PLAN: SLP FREQUENCY: 1-2x/week  SLP DURATION: 12 weeks  PLANNED INTERVENTIONS: Language facilitation, Cueing hierachy, Internal/external aids, Functional tasks, Multimodal communication approach, and SLP instruction and feedback   Delon Bangs, M.S., CCC-SLP Speech-Language Pathologist [facility] [phone] [place])  [facility] MAIN [facility] SERVICES [address] [place], [place], [address] Phone: [phone]   Fax:  [phone]

## 2023-01-30 NOTE — Therapy (Signed)
 OUTPATIENT OCCUPATIONAL THERAPY NEURO EVALUATION  Patient Name: Cheyenne Martinez MRN: [id number] DOB:11/24/1975, 47 y.o., female Today's Date: 01/30/2023  PCP: Dr. Alla REFERRING PROVIDER: Suzzane Scarpa, PA  END OF SESSION:  OT End of Session - 01/30/23 0938     Visit Number 3    Number of Visits 24    Date for OT Re-Evaluation 04/03/23    Authorization Time Period Reporting period beginning 01/09/23    OT Start Time 0845    OT Stop Time 0927    OT Time Calculation (min) 42 min    Activity Tolerance Patient tolerated treatment well    Behavior During Therapy [facility] for tasks assessed/performed            Past Medical History:  Diagnosis Date   Diabetes mellitus without complication (HCC)    Hypertension    Stroke (HCC)    No past surgical history on file. Patient Active Problem List   Diagnosis Date Noted   CVA (cerebral vascular accident) (HCC) 07/01/2021   New onset type 2 diabetes mellitus (HCC) 07/01/2021   Dyslipidemia 07/01/2021   Hypertensive urgency 07/01/2021    ONSET DATE: 07/01/21  REFERRING DIAG: History of Cva, muscle weakness, lack of coordination  THERAPY DIAG:  Muscle weakness (generalized)  Other lack of coordination  Rationale for Evaluation and Treatment: Rehabilitation  SUBJECTIVE:  SUBJECTIVE STATEMENT:  Pt . reports having had a nice  holiday with her family. Pt accompanied by:  Mother, [name]  PERTINENT HISTORY:  Pt known to this OT as pt participated in OT in this clinic after suffering a CVA in June of '23.   Per this writer's OT d/c note on 11/09/21: Pt was seen by OT for 18 sessions to address visual and RUE deficits following L PCA stroke. Pt has made strong functional gains, as noted by increased FOTO score from 36 at eval to 60 at discharge. Pt engages her RUE into self care tasks as a fair-good stabilizer, but pt relies on L non-dominant arm for tasks which require strength and dexterity. Pt has increased attention to R side,  requiring min vc on occasion for R hand proprioception. HEP was reviewed this date with pt/mother acknowledging understanding and benefits of consistent/daily participation to help pt continue to improve use of the RUE. OT encouraged new therapy referral in the new year if pt wishes to continue to focus on residual deficits post stroke. Discharge completed this date d/t visit limitations with insurance within this calendar year.   PRECAUTIONS: Fall  WEIGHT BEARING RESTRICTIONS: No  PAIN:  Are you having pain? No  FALLS: Has patient fallen in last 6 months? No  LIVING ENVIRONMENT: Lives with: lives with their family, Adult daughter 84 years old, and 2 sons 57 and 78 years old; mom lives 5 min away Lives in: house Stairs: 4 steps, no rail Has following equipment at home: None (walk in shower and standard toilet)  PLOF: Independent prior to CVA  PATIENT GOALS: improve speech, use of arm, improve leg strength  OBJECTIVE:  Note: Objective measures were completed at Evaluation unless otherwise noted.  HAND DOMINANCE: Right  ADLs: Overall ADLs: Uses RUE as a fair stabilizer to non-dominant L arm Transfers/ambulation related to ADLs:  Eating: cuts food with L hand cut only Grooming: uses L hand to manipulate grooming utensils; requires assist for bimanual tasks such as putting up hair UB Dressing: indep to don a sports bra; unable to manage clothing fasteners LB Dressing: indep to don LB clothing  including slip on shoes and elastic waist paints; unable to manage clothing fasteners Toileting: indep with non-dominant hand Bathing: modified indep with limited use of the R dominant hand Tub Shower transfers: indep (walk in shower) Equipment: none  IADLs: Shopping: pt now driving, indep Light housekeeping: shared between pt and children Meal Prep: pt cooks daily, minimal use of the R dominant arm other than to stabilize meal prep items Community mobility: indep without use of  AD Medication management: indep with pill Retail Buyer: indep on phone  Handwriting: Increased time  MOBILITY STATUS:  R sided hemiparesis; able to amb community distances without AD  POSTURE COMMENTS:  R sided hemiparesis Sitting balance: Moves/returns truncal midpoint >2 inches in all planes  ACTIVITY TOLERANCE: Activity tolerance: WFL for tasks assessed  FUNCTIONAL OUTCOME MEASURES: FOTO: 61; predicted 62  UPPER EXTREMITY ROM:   Active ROM Right eval Left eval  Shoulder flexion 70   Shoulder abduction 70   Shoulder adduction    Shoulder extension    Shoulder internal rotation Hand to iliac crest    Shoulder external rotation [facility]   Elbow flexion    Elbow extension    Wrist flexion    Wrist extension    Wrist ulnar deviation    Wrist radial deviation    Wrist pronation    Wrist supination 72   (Blank rows = not tested)  UPPER EXTREMITY MMT:     MMT Right eval Left eval  Shoulder flexion 3- 4+  Shoulder abduction 3+ (within available range) 4+  Shoulder adduction    Shoulder extension    Shoulder internal rotation    Shoulder external rotation    Middle trapezius    Lower trapezius    Elbow flexion 4- 4+  Elbow extension 4+ 4+  Wrist flexion 4- 4+  Wrist extension 4- 4+  Wrist ulnar deviation    Wrist radial deviation    Wrist pronation    Wrist supination    (Blank rows = not tested)  HAND FUNCTION: Grip strength: Right: 19 lbs; Left: 69 lbs, Lateral pinch: Right: 5 lbs, Left: 21 lbs, and 3 point pinch: Right: 6 lbs, Left: 21 lbs  COORDINATION: 9 Hole Peg test: Right: 3 pegs placed in 2 min 21 sec  sec; Left: 22 sec  SENSATION: Light touch: Impaired   EDEMA: No visible edema  MUSCLE TONE: RUE: Hypertonic  COGNITION: Overall cognitive status: Impaired; expressive and receptive aphasia  VISION ASSESSMENT: Not tested; limited by time constraints d/t pt arriving late to eval; will assess as needed in upcoming  sessions/within functional contexts   PERCEPTION: Not tested this date; will assess in upcoming sessions  PRAXIS: Impaired: Motor planning  OBSERVATIONS: Pt appears happy to be back to OT to begin a 2nd round of rehab, and motivated to work on strengthening the R side for improving use for daily tasks.   TODAY'S TREATMENT:  DATE: 01/30/23:  Therapeutic Exercise:  Pt. worked on the [employer] for 6 min. with constant monitoring of the BUEs. Pt. worked on changing, and alternating forward/reverse for 2 min. Rest breaks were required.  Pt. worked on pinch strengthening in the left hand for lateral, and 3pt. pinch using yellow, red, green, and blue resistive clips. Pt. worked on placing the clips at various vertical and horizontal angles. Tactile and verbal cues were required for eliciting the desired movement.   Neuromuscular Re-education:  Pt. worked on grasping 1 resistive cubes alternating thumb opposition to the tip of the 2nd while the board is placed at a vertical angle to encourage wrist extension, and increase the challenge. Pt. worked on pressing the cubes back into place while alternating isolated 2nd through 5th digit extension.    PATIENT EDUCATION: Education details: OT role, goals, poc Person educated: Patient and Parent Education method: Explanation Education comprehension: verbalized understanding  HOME EXERCISE PROGRAM: To be initiated in upcoming sessions  GOALS: Goals reviewed with patient? Yes  SHORT TERM GOALS: Target date: 02/20/23  Pt will be indep to perform HEP for improving RUE strength and coordination for daily tasks. Baseline:  Eval: Not yet initiated Goal status: INITIAL  LONG TERM GOALS: Target date: 04/03/23  Pt will increase FOTO score to 65 or better to indicate improvement in self perceived functional use of the R arm with daily  tasks. Baseline: Eval: 61; predicted 62 Goal status: INITIAL  2.  Pt will increase R grip strength by 5-7 lbs in order to hold and transport ADL supplies in R dominant hand with increased confidence. Baseline: Eval: R grip strength 19 lbs. Goal status: INITIAL  3.  Pt will increase R hand FMC/dexterity skills to enable pt to engage the R hand in manipulation of clothing fasteners.  Baseline: Eval: Assist needed with clothing fasteners; R 9 hole peg test: able to place 3 pegs in 2 min 21 sec. Goal status: INITIAL  4.  Pt will increase active R shoulder flexion and abd by 20 or more degrees in order to bimanually reach for items in closet at shoulder level.   Baseline: Eval: R shoulder flexion and abd 70*; pt uses L arm to reach for items at or above shoulder level. Goal status: INITIAL  ASSESSMENT:  CLINICAL IMPRESSION:  Pt. is now trying to engage her RUE during more daily ADL, and IADL tasks at home. Pt. requires cues, and assist for proper movement patterns, and to formulate lateral, and 3pt. pinch position. Pt. required the [employer] to be placed on level 1 with it being modified to .5 half way through.  Pt. continues to benefit from additional skilled OT to target strengthening and coordination to better engage the RUE into daily tasks.   PERFORMANCE DEFICITS: in functional skills including ADLs, IADLs, coordination, dexterity, ROM, strength, flexibility, Fine motor control, Gross motor control, mobility, balance, body mechanics, decreased knowledge of use of DME, vision, and UE functional use, cognitive skills including thought and understand, and psychosocial skills including coping strategies, environmental adaptation, habits, interpersonal interactions, and routines and behaviors.   IMPAIRMENTS: are limiting patient from ADLs, IADLs, work, leisure, and social participation.   CO-MORBIDITIES: has co-morbidities such as global aphasia, R sided hemiparesis, HTN  that affects occupational  performance. Patient will benefit from skilled OT to address above impairments and improve overall function.  MODIFICATION OR ASSISTANCE TO COMPLETE EVALUATION: No modification of tasks or assist necessary to complete an evaluation.  OT OCCUPATIONAL PROFILE AND HISTORY: Problem  focused assessment: Including review of records relating to presenting problem.  CLINICAL DECISION MAKING: Moderate - several treatment options, min-mod task modification necessary  REHAB POTENTIAL: Good  EVALUATION COMPLEXITY: Moderate    PLAN:  OT FREQUENCY: 1-2x/week  OT DURATION: 12 weeks  PLANNED INTERVENTIONS: 97168 OT Re-evaluation, 97535 self care/ADL training, 02889 therapeutic exercise, 97530 therapeutic activity, 97112 neuromuscular re-education, 97140 manual therapy, 97116 gait training, 02989 moist heat, 97010 cryotherapy, passive range of motion, balance training, functional mobility training, visual/perceptual remediation/compensation, psychosocial skills training, energy conservation, coping strategies training, patient/family education, and DME and/or AE instructions  RECOMMENDED OTHER SERVICES: Pt has orders for SLP and PT  CONSULTED AND AGREED WITH PLAN OF CARE: Patient and family member/caregiver  PLAN FOR NEXT SESSION: See above  Richardson Otter, MS, OTR/L  01/30/2023, 9:44 AM

## 2023-02-07 ENCOUNTER — Ambulatory Visit: Payer: Medicaid Other | Attending: Physician Assistant

## 2023-02-07 ENCOUNTER — Ambulatory Visit: Payer: Medicaid Other | Admitting: Physical Therapy

## 2023-02-07 ENCOUNTER — Ambulatory Visit: Payer: Medicaid Other | Admitting: Occupational Therapy

## 2023-02-07 DIAGNOSIS — R2681 Unsteadiness on feet: Secondary | ICD-10-CM

## 2023-02-07 DIAGNOSIS — R482 Apraxia: Secondary | ICD-10-CM | POA: Diagnosis present

## 2023-02-07 DIAGNOSIS — R4701 Aphasia: Secondary | ICD-10-CM | POA: Insufficient documentation

## 2023-02-07 DIAGNOSIS — R278 Other lack of coordination: Secondary | ICD-10-CM

## 2023-02-07 DIAGNOSIS — Z8673 Personal history of transient ischemic attack (TIA), and cerebral infarction without residual deficits: Secondary | ICD-10-CM | POA: Insufficient documentation

## 2023-02-07 DIAGNOSIS — R41841 Cognitive communication deficit: Secondary | ICD-10-CM | POA: Diagnosis present

## 2023-02-07 DIAGNOSIS — M6281 Muscle weakness (generalized): Secondary | ICD-10-CM

## 2023-02-07 DIAGNOSIS — R262 Difficulty in walking, not elsewhere classified: Secondary | ICD-10-CM | POA: Insufficient documentation

## 2023-02-07 NOTE — Therapy (Signed)
 OUTPATIENT OCCUPATIONAL THERAPY NEURO TREATMENT  Patient Name: Cheyenne Martinez MRN: [id number] DOB:11-12-1975, 48 y.o., female Today's Date: 02/07/2023  PCP: Dr. Alla REFERRING PROVIDER: Suzzane Scarpa, PA  END OF SESSION:  OT End of Session - 02/07/23 1428     Visit Number 4    Number of Visits 24    Date for OT Re-Evaluation 04/03/23    Authorization Time Period Reporting period beginning 01/09/23    OT Start Time 0845    OT Stop Time 0930    OT Time Calculation (min) 45 min    Activity Tolerance Patient tolerated treatment well    Behavior During Therapy Impulsive;Restless            Past Medical History:  Diagnosis Date   Diabetes mellitus without complication (HCC)    Hypertension    Stroke (HCC)    No past surgical history on file. Patient Active Problem List   Diagnosis Date Noted   CVA (cerebral vascular accident) (HCC) 07/01/2021   New onset type 2 diabetes mellitus (HCC) 07/01/2021   Dyslipidemia 07/01/2021   Hypertensive urgency 07/01/2021    ONSET DATE: 07/01/21  REFERRING DIAG: History of Cva, muscle weakness, lack of coordination  THERAPY DIAG:  Muscle weakness (generalized)  Rationale for Evaluation and Treatment: Rehabilitation  SUBJECTIVE:  SUBJECTIVE STATEMENT:  Pt . Reports doing well today. Pt accompanied by:  Mother, [name]  PERTINENT HISTORY:  Pt known to this OT as pt participated in OT in this clinic after suffering a CVA in June of '23.   Per this writer's OT d/c note on 11/09/21: Pt was seen by OT for 18 sessions to address visual and RUE deficits following L PCA stroke. Pt has made strong functional gains, as noted by increased FOTO score from 36 at eval to 60 at discharge. Pt engages her RUE into self care tasks as a fair-good stabilizer, but pt relies on L non-dominant arm for tasks which require strength and dexterity. Pt has increased attention to R side, requiring min vc on occasion for R hand proprioception. HEP was reviewed  this date with pt/mother acknowledging understanding and benefits of consistent/daily participation to help pt continue to improve use of the RUE. OT encouraged new therapy referral in the new year if pt wishes to continue to focus on residual deficits post stroke. Discharge completed this date d/t visit limitations with insurance within this calendar year.   PRECAUTIONS: Fall  WEIGHT BEARING RESTRICTIONS: No  PAIN:  Are you having pain? No  FALLS: Has patient fallen in last 6 months? No  LIVING ENVIRONMENT: Lives with: lives with their family, Adult daughter 67 years old, and 2 sons 82 and 57 years old; mom lives 5 min away Lives in: house Stairs: 4 steps, no rail Has following equipment at home: None (walk in shower and standard toilet)  PLOF: Independent prior to CVA  PATIENT GOALS: improve speech, use of arm, improve leg strength  OBJECTIVE:  Note: Objective measures were completed at Evaluation unless otherwise noted.  HAND DOMINANCE: Right  ADLs: Overall ADLs: Uses RUE as a fair stabilizer to non-dominant L arm Transfers/ambulation related to ADLs:  Eating: cuts food with L hand cut only Grooming: uses L hand to manipulate grooming utensils; requires assist for bimanual tasks such as putting up hair UB Dressing: indep to don a sports bra; unable to manage clothing fasteners LB Dressing: indep to don LB clothing including slip on shoes and elastic waist paints; unable to manage clothing fasteners Toileting:  indep with non-dominant hand Bathing: modified indep with limited use of the R dominant hand Tub Shower transfers: indep (walk in shower) Equipment: none  IADLs: Shopping: pt now driving, indep Light housekeeping: shared between pt and children Meal Prep: pt cooks daily, minimal use of the R dominant arm other than to stabilize meal prep items Community mobility: indep without use of AD Medication management: indep with pill Retail Buyer: indep  on phone  Handwriting: Increased time  MOBILITY STATUS:  R sided hemiparesis; able to amb community distances without AD  POSTURE COMMENTS:  R sided hemiparesis Sitting balance: Moves/returns truncal midpoint >2 inches in all planes  ACTIVITY TOLERANCE: Activity tolerance: WFL for tasks assessed  FUNCTIONAL OUTCOME MEASURES: FOTO: 61; predicted 62  UPPER EXTREMITY ROM:   Active ROM Right eval Left eval  Shoulder flexion 70   Shoulder abduction 70   Shoulder adduction    Shoulder extension    Shoulder internal rotation Hand to iliac crest    Shoulder external rotation [facility]   Elbow flexion    Elbow extension    Wrist flexion    Wrist extension    Wrist ulnar deviation    Wrist radial deviation    Wrist pronation    Wrist supination 72   (Blank rows = not tested)  UPPER EXTREMITY MMT:     MMT Right eval Left eval  Shoulder flexion 3- 4+  Shoulder abduction 3+ (within available range) 4+  Shoulder adduction    Shoulder extension    Shoulder internal rotation    Shoulder external rotation    Middle trapezius    Lower trapezius    Elbow flexion 4- 4+  Elbow extension 4+ 4+  Wrist flexion 4- 4+  Wrist extension 4- 4+  Wrist ulnar deviation    Wrist radial deviation    Wrist pronation    Wrist supination    (Blank rows = not tested)  HAND FUNCTION: Grip strength: Right: 19 lbs; Left: 69 lbs, Lateral pinch: Right: 5 lbs, Left: 21 lbs, and 3 point pinch: Right: 6 lbs, Left: 21 lbs  COORDINATION: 9 Hole Peg test: Right: 3 pegs placed in 2 min 21 sec  sec; Left: 22 sec  SENSATION: Light touch: Impaired   EDEMA: No visible edema  MUSCLE TONE: RUE: Hypertonic  COGNITION: Overall cognitive status: Impaired; expressive and receptive aphasia  VISION ASSESSMENT: Not tested; limited by time constraints d/t pt arriving late to eval; will assess as needed in upcoming sessions/within functional contexts   PERCEPTION: Not tested this date; will assess in upcoming  sessions  PRAXIS: Impaired: Motor planning  OBSERVATIONS: Pt appears happy to be back to OT to begin a 2nd round of rehab, and motivated to work on strengthening the R side for improving use for daily tasks.   TODAY'S TREATMENT:  DATE: 02/07/2023:  Therapeutic Exercise:  Pt. worked on the [employer] for 8 min. with constant monitoring of the BUEs on work level 2. Pt. worked on changing, and alternating forward/reverse for 2 min. Rest breaks were required.  Pt. worked on pinch strengthening in the left hand for lateral, and 3pt. pinch using yellow, red, green, and blue resistive clips. Pt. worked on placing the clips at various vertical and horizontal angles. Tactile and verbal cues were required for eliciting the desired movement.   Neuromuscular Re-education:  Pt. worked on grasping 1/2 cubes with the right hand, storing them in the palm of her hand, and translatory movements moving them from the palm to the tip of the 2nd digit, and thumb. Pt. worked on [employer] a vertical tower of 6 cubes. Pt. was able to store 6 cubes. However, Pt. presented with difficulty moving the objects through her hand from the palm to the tip of the 2nd digit, and thumb in preparation for discarding them from the hand.    PATIENT EDUCATION: Education details:  Right hand [facility] skills.  Person educated: Patient and Parent Education method: Explanation Education comprehension: verbalized understanding  HOME EXERCISE PROGRAM: To be initiated in upcoming sessions  GOALS: Goals reviewed with patient? Yes  SHORT TERM GOALS: Target date: 02/20/23  Pt will be indep to perform HEP for improving RUE strength and coordination for daily tasks. Baseline:  Eval: Not yet initiated Goal status: INITIAL  LONG TERM GOALS: Target date: 04/03/23  Pt will increase FOTO score to 65 or better to indicate  improvement in self perceived functional use of the R arm with daily tasks. Baseline: Eval: 61; predicted 62 Goal status: INITIAL  2.  Pt will increase R grip strength by 5-7 lbs in order to hold and transport ADL supplies in R dominant hand with increased confidence. Baseline: Eval: R grip strength 19 lbs. Goal status: INITIAL  3.  Pt will increase R hand FMC/dexterity skills to enable pt to engage the R hand in manipulation of clothing fasteners.  Baseline: Eval: Assist needed with clothing fasteners; R 9 hole peg test: able to place 3 pegs in 2 min 21 sec. Goal status: INITIAL  4.  Pt will increase active R shoulder flexion and abd by 20 or more degrees in order to bimanually reach for items in closet at shoulder level.   Baseline: Eval: R shoulder flexion and abd 70*; pt uses L arm to reach for items at or above shoulder level. Goal status: INITIAL  ASSESSMENT:  CLINICAL IMPRESSION:  Pt. Continues to engage her RUE during more daily ADL, and IADL tasks at home. Pt. requires cues, and assist for translatory movement patterns, and to formulate lateral, and 3pt. pinch position. Pt. Was able to tolerate the [employer] for the entire duration of the 8 min. On level 2.   Pt. was able to store 6 cubes. However, Pt. presented with difficulty moving the objects through her hand from the palm to the tip of the 2nd digit, and thumb in preparation for discarding them from the hand. Pt. continues to benefit from additional skilled OT to target strengthening and coordination to better engage the RUE into daily tasks.   PERFORMANCE DEFICITS: in functional skills including ADLs, IADLs, coordination, dexterity, ROM, strength, flexibility, Fine motor control, Gross motor control, mobility, balance, body mechanics, decreased knowledge of use of DME, vision, and UE functional use, cognitive skills including thought and understand, and psychosocial skills including coping strategies, environmental adaptation,  habits, interpersonal interactions,  and routines and behaviors.   IMPAIRMENTS: are limiting patient from ADLs, IADLs, work, leisure, and social participation.   CO-MORBIDITIES: has co-morbidities such as global aphasia, R sided hemiparesis, HTN  that affects occupational performance. Patient will benefit from skilled OT to address above impairments and improve overall function.  MODIFICATION OR ASSISTANCE TO COMPLETE EVALUATION: No modification of tasks or assist necessary to complete an evaluation.  OT OCCUPATIONAL PROFILE AND HISTORY: Problem focused assessment: Including review of records relating to presenting problem.  CLINICAL DECISION MAKING: Moderate - several treatment options, min-mod task modification necessary  REHAB POTENTIAL: Good  EVALUATION COMPLEXITY: Moderate    PLAN:  OT FREQUENCY: 1-2x/week  OT DURATION: 12 weeks  PLANNED INTERVENTIONS: 97168 OT Re-evaluation, 97535 self care/ADL training, 02889 therapeutic exercise, 97530 therapeutic activity, 97112 neuromuscular re-education, 97140 manual therapy, 97116 gait training, 02989 moist heat, 97010 cryotherapy, passive range of motion, balance training, functional mobility training, visual/perceptual remediation/compensation, psychosocial skills training, energy conservation, coping strategies training, patient/family education, and DME and/or AE instructions  RECOMMENDED OTHER SERVICES: Pt has orders for SLP and PT  CONSULTED AND AGREED WITH PLAN OF CARE: Patient and family member/caregiver  PLAN FOR NEXT SESSION: See above  Richardson Otter, MS, OTR/L  02/07/2023, 2:30 PM

## 2023-02-07 NOTE — Therapy (Signed)
 OUTPATIENT SPEECH LANGUAGE PATHOLOGY APHASIA TREATMENT   Patient Name: Cheyenne Martinez MRN: [id number] DOB:[birth year], 48 y.o., female Today's Date: 02/07/2023  PCP: No PCP REFERRING PROVIDER: Lamar Manus, PA-C   End of Session - 02/07/23 0950     Visit Number 7    Number of Visits 24    Date for SLP Re-Evaluation 03/20/23    Authorization Type [place] [employer] Prepaid [facility]    Authorization Time Period 27 combined PT/OT/ST visits until 01/30/24    Authorization - Visit Number 7    Authorization - Number of Visits 24    Progress Note Due on Visit 10    SLP Start Time 0802    SLP Stop Time  0845    SLP Time Calculation (min) 43 min    Activity Tolerance Patient tolerated treatment well             Past Medical History:  Diagnosis Date   Diabetes mellitus without complication (HCC)    Hypertension    Stroke (HCC)    No past surgical history on file. 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    PAIN:  Are you having pain? No  FALLS: Has patient fallen in last 6 months?  No  LIVING ENVIRONMENT: Lives with: lives with their family Lives in: House/apartment  PLOF:  Level of  assistance: Independent with ADLs   PATIENT GOALS to improve communication  OBJECTIVE:   DIAGNOSTIC FINDINGS: 07/01/2021 MRI BRAIN W WO CONTRAST  IMPRESSION:  1. Evolving early subacute left PCA distribution infarct as above,  with additional scattered small volume ischemic changes within the  left frontotemporal and right parietal lobe as above. Associated  mild petechial blood products at the left occipital lobe without  frank hemorrhagic transformation or significant mass effect.  2. Underlying mild chronic microvascular ischemic disease with  chronic right cerebellar infarct.    07/04/2021 CTA HEAD NECK W WO CONTRAST  IMPRESSION:  Evolving recent infarcts as seen on recent MRI. There is likely mild  petechial hemorrhage associated with the left parieto-occipital  infarct. No significant mass effect.  Plaque at the left greater than right ICA origins without  hemodynamically significant stenosis. Marked stenosis at the right  vertebral origin without apparent flow limitation.  Intracranial atherosclerosis involving anterior and posterior  circulations. No proximal intracranial vessel occlusion.    Today's Treatment:  Pt reports accidentally forgetting SGD at home. Pt endorse feeling somewhat more comfortable with the device and reports using it at home.    Auditory Comprehension:   Pt followed 1-step commands involving objects with 70% accuracy; improving to 100% with multimodal cueing.     Expressive Communication:  Pt named common obejcts with 30% accuracy indep; improving to fair-good approximations 90% of the time with multimodal cueing.  Pt  repeated 2-3 word phrases with 70% accuracy indep; fair-good approximations >90% of the time with hierarchical cueing.   Pt answered biographical questions with use of total communication (speech, gestures, writing) 80% of the time.   PATIENT EDUCATION: Education details:as above Person educated: Patient and Engineer, Structural ,  mother Education method: Explanation, Demonstration, and Verbal cues Education comprehension: needs further education   GOALS: Goals reviewed with patient? No  SHORT TERM GOALS: Target date: 10 sessions  Pt will participate in further assessment of functional reading, writing, and repetition.  Baseline:  Goal status: INITIAL  2.  Pt will follow 1-step directions with >50% accuracy with multimodal cues. Baseline:  Goal status: INITIAL  3.  Pt will repeat single words using a speech generating device in 50% of opportunities given frequent maximal verbal and maximal visual cues. Baseline:  Goal status: INITIAL  4. With Moderate A-Maximal A, patient will perform structured naming tasks with fair-good approximations 50% of the time.   Baseline:  Goal Status: INITIAL  5. Using multimodal communication and assistance as needed, pt will answer basic biographical questions with 60% accuracy.   Baseline:    Goal Status: INITIAL     LONG TERM GOALS: Target date: 03/20/23  With Min A, patient/family will demonstrate understanding of the following concepts: aphasia, spontaneous recovery, communication vs conversation, strengths/strategies to promote success in all communication settings. Baseline:  Goal status: INITIAL  2.  Using multimodal communication, pt will answer basic biographical questions in 8 out of 10 opportunities.  Baseline:  Goal status: INITIAL   ASSESSMENT:  CLINICAL IMPRESSION: Patient is a 48 y.o. female who was seen today for a speech-language treatment. 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 Pt's expressive communication is c/b severe word finding deficits, neologisms, phonemic and semantic paraphasias. Pt with islands of error free speech and well as automatic speech. Pt with concomitant apraxia of speech.This results in reduced express basic wants and needs. Pt also presents with receptive language deficits that  impact her ability to answer basic yes/no questions and follow basic directions, although auditory comprehension is a relative strength. See details of tx session above.   OBJECTIVE IMPAIRMENTS include expressive language, receptive language, and aphasia.These impairments are limiting patient from return to work, managing medications, managing appointments, managing finances, household responsibilities, ADLs/IADLs, and effectively communicating at home and in community. Factors affecting potential to achieve goals and functional outcome are severity of impairments and time since stroke . Patient will benefit from skilled SLP services to address above impairments and improve overall function.  REHAB POTENTIAL: Good  PLAN: SLP FREQUENCY: 1-2x/week  SLP DURATION: 12 weeks  PLANNED INTERVENTIONS: Language facilitation, Cueing hierachy, Internal/external aids, Functional tasks, Multimodal communication approach, and SLP instruction and feedback   Delon Bangs, M.S., CCC-SLP Speech-Language Pathologist [facility] [phone] [place])  [facility] MAIN [facility] SERVICES [address] [place], [place], [address] Phone: [phone]   Fax:  [phone]

## 2023-02-07 NOTE — Therapy (Signed)
 OUTPATIENT PHYSICAL THERAPY NEURO TREATMENT   Patient Name: Cheyenne Martinez MRN: [id number] DOB:1975-12-25, 48 y.o., female Today's Date: 02/07/2023   PCP:  No PCP  REFERRING PROVIDER:  Suzzane Charleston PA  END OF SESSION:  PT End of Session - 02/07/23 0920     Visit Number 5    Number of Visits 24    Date for PT Re-Evaluation 03/20/23    Progress Note Due on Visit 10    PT Start Time 0930    PT Stop Time 1015    PT Time Calculation (min) 45 min    Equipment Utilized During Treatment Gait belt    Activity Tolerance Patient tolerated treatment well    Behavior During Therapy Impulsive;Restless              Past Medical History:  Diagnosis Date   Diabetes mellitus without complication (HCC)    Hypertension    Stroke (HCC)    No past surgical history on file. Patient Active Problem List   Diagnosis Date Noted   CVA (cerebral vascular accident) (HCC) 07/01/2021   New onset type 2 diabetes mellitus (HCC) 07/01/2021   Dyslipidemia 07/01/2021   Hypertensive urgency 07/01/2021    ONSET DATE:  07/01/2021  REFERRING IPJH:S13.26 (ICD-10-CM) - Personal history of transient ischemic attack (TIA), and cerebral infarction without residual deficits  THERAPY DIAG:   Muscle weakness (generalized)   Unsteadiness on feet   Hemiplegia and hemiparesis following cerebral infarction affecting  Right dominant side (HCC)   Other abnormalities of gait and mobility   Acute ischemic L ACA stroke (HCC)  Rationale for Evaluation and Treatment: Rehabilitation  SUBJECTIVE:                                                                                                                                                                                             SUBJECTIVE STATEMENT:   Patient denies any pain and reports that she is doing okay.  Informed pt and mother of visit limitations form insurance. Offered to adjust OT?/PT/SLP split to allow increased visits with 1 discipline over another  based on perceived deficits. Pt states that she wishes to keep the scheduled the same and see OT/PT/ SLP evenly.   Pt accompanied by:  Mother  PERTINENT HISTORY: Pt is a 48 y/o female who had 2 episodes of stroke and has received Physical therapy in the same OP clinic and has returned due to decline in functional mobility and  pain RLE with WB activities.  Pt with PMHx:   CVA x 2,   type 2 diabetes mellitus (HCC)   Hypertensive urgency  Dyslipidemia  PAIN:  Are you having pain? Yes: Pain location: 0/10 Pain description: sharp Aggravating factors: WB Relieving factors: Rest  PRECAUTIONS: Fall  RED FLAGS: None   WEIGHT BEARING RESTRICTIONS: No  FALLS: Has patient fallen in last 6 months? No  LIVING ENVIRONMENT: Lives with: lives with their family Lives in: House/apartment Stairs: Yes: External: 4 steps; can reach both Has following equipment at home: None  PLOF: Independent with basic ADLs, Independent with household mobility without device, Independent with homemaking with ambulation, Independent with gait, Independent with transfers, and Leisure: driving  PATIENT GOALS:  I want to walk better and without falling and pain.   OBJECTIVE:  Note: Objective measures were completed at Evaluation unless otherwise noted.  DIAGNOSTIC FINDINGS: None for 2024 07/04/2021:  Evolving recent infarcts as seen on recent MRI. There is likely mild petechial hemorrhage associated with the left parieto-occipital infarct. No significant mass effect.   Plaque at the left greater than right ICA origins without hemodynamically significant stenosis. Marked stenosis at the right vertebral origin without apparent flow limitation.   Intracranial atherosclerosis involving anterior and posterior circulations. No proximal intracranial vessel occlusion.  COGNITION: Overall cognitive status: Within functional limits for tasks assessed   SENSATION: WFL  COORDINATION:   MUSCLE TONE: RLE:  Moderate and Hypertonic  MUSCLE LENGTH: Hamstrings: Right 20 deg; Left WFL deg   POSTURE: rounded shoulders and R sided lean  LOWER EXTREMITY ROM:    PROM WFL AROM WFL    LOWER EXTREMITY MMT:    MMT Right Eval Left Eval  Hip flexion 3/5 4-/5  Hip extension 3/5 4/5  Hip abduction 3/5 4/5  Hip adduction 4-/5 4/5  Hip internal rotation 3/5 4-/5  Hip external rotation 3/5 4-/5  Knee flexion 3+/5 4/5  Knee extension 3/5 4/5  Ankle dorsiflexion 3-/5 4/5  Ankle plantarflexion    Ankle inversion    Ankle eversion    (Blank rows = not tested)  BED MOBILITY:  Independent  TRANSFERS: Assistive device utilized: None  Sit to stand: Complete Independence Stand to sit: Complete Independence Chair to chair: Complete Independence Floor: SBA  RAMP:  Level of Assistance: CGA Assistive device utilized: None Ramp Comments: Pt needs CGA to ascend and Min to descend.   CURB:  Level of Assistance: CGA Assistive device utilized: None Curb Comments: Due to weakness, unsteady on feet and difficulty raising up concentrically.   STAIRS: Level of Assistance: CGA Stair Negotiation Technique: Step to Pattern Forwards with Single Rail on Left Number of Stairs: 4  Height of Stairs: 6 inch  Comments: Slow with heavy reliance on HR with one step a t a time.   GAIT: Gait pattern: step through pattern, decreased arm swing- Right, decreased step length- Right, decreased stance time- Left, decreased stride length, decreased ankle dorsiflexion- Right, and genu recurvatum- Right Distance walked: 68ft Assistive device utilized: None Level of assistance: SBA Comments: Impulsive, and needs VC for safety and proper technique  FUNCTIONAL TESTS:  8 reps in 30 seconds chair stand test Timed up and go (TUG): 13secs 10 meter walk test: 12 secs= 1.83m/sec Balance Test: Unable to Tandem stand, unable to SL stand, Rhomberg on NBOS x 20 secs  PATIENT SURVEYS:  LEFS 56/80 FOTO Complete FOTO next  session.  TODAY'S TREATMENT:  DATE:    Nustep reciprocal BUE/BLE movement x 4 min, level 1-3. R hand strap in place to force UE use. Tactile cues throughout for improved hip control to maintain neutral hip IE/IR  In parallel bars:  Step over hurdle x 10 bil  Reverse step off 4 inch step x 8 bil cues for improved knee control on the RLE Side stepping in partial squat x 2 laps  Forward reverse with 3# AW 48ft each 2 x 3 laps.   Seated ball raise to eye level x 10  Seated trunk rotation with BUE to tap ball on target.  Standing trunk rotation with BUE to tap ball on target at hip and shoulder height x 10 total bil  1 foot on 4inch step:  Trunk rotation with RLE on step x 5 bil. Unable to perform with LLE on step due to unsteadiness in stance on RLE.  Static hold with 1LE on 4inch step 2x 15 sec bil   Seated RLE LAQ 3#AW.  Seated march 3# AW.   CGA for safety while ambulating and in standing with tactile assist to force increased WB into the RLE and block GR with reverse step/gait.  PATIENT EDUCATION: Education detailsNetwork Engineer, findings, rehab progression, significance of HEP Person educated: Patient and Parent Education method: Explanation Education comprehension: verbalized understanding, verbal cues required, and needs further education  HOME EXERCISE PROGRAM:   Access Code: [email or web address] URL: [email or web address] Date: 01/01/2023 Prepared by: Massie Dollar  Exercises - Seated Long Arc Quad  - 1 x daily - 7 x weekly - 3 sets - 10 reps - Seated Heel Slide  - 1 x daily - 7 x weekly - 3 sets - 10 reps - 2 hold - Sit to Stand  - 1 x daily - 7 x weekly - 3 sets - 10 reps - Seated Toe Raise  - 1 x daily - 7 x weekly - 3 sets - 15 reps - 2 hold - Standing Alternating Knee Flexion  - 1 x daily - 7 x weekly - 3 sets - 10 reps - 2  hold   GOALS: Goals reviewed with patient? Yes  SHORT TERM GOALS: Target date: 01/12/2023  Patient will be independent in home exercise program to improve strength/mobility for better functional independence with ADLs.  Baseline: HEP administered 12/26/2022 Goal status: INITIAL   LONG TERM GOALS: Target date: 03/22/2022  Patient will complete >12 times sit to stand test in 30 seconds indicating an increased LE strength and improved balance.  Baseline: 8 reps in 30 secs Goal status: INITIAL  2.  Pt will improve LEFS score >64/80  Baseline: 56/80 Goal status: INITIAL  3.  Pt will improve Hamstring Length to <10 degrees to improve LE function and balance Baseline: 20 deg Hamstring lenght Goal status: INITIAL  4.  Pt will tandem stand for 30 secs without LOB.  Baseline: Unable Goal status: INITIAL  5.Pt will complete TUG in <10 secs to improve balance and safety with all functional mobility.  Baseline: 13secs Goal status: INITIAL  6.  Pt will complete 10 M gait speed in <9 secs to demonstrate safety with community level ambulation Baseline: 1.49m/sec Goal status: INITIAL  7.  Pt will improve FOTO score by 5 points  Baseline: 64 Goal status: INITIAL   ASSESSMENT:  CLINICAL IMPRESSION:   Patient arrives to treatment session motivated to participate. Session focused on BLE strengthening with focus on R LE HS activation and eccentric control of R knee into flexion. Continues  to demonstrate GR during gait and sit to stand requiring tactile cues and facilitation. Improved control with increased repetitions throughout session, but GR and trendelenberg present with gait leaving rehab department. Pt will benefit from continued skilled PT interventions to address impairments and help pt achieve her goals.   OBJECTIVE IMPAIRMENTS: Abnormal gait, decreased activity tolerance, decreased balance, decreased coordination, decreased endurance, decreased mobility, difficulty walking,  decreased ROM, decreased strength, and decreased safety awareness.   ACTIVITY LIMITATIONS: lifting, bending, stairs, and locomotion level  PARTICIPATION LIMITATIONS: driving, shopping, community activity, and stairs  PERSONAL FACTORS: Age, Time since onset of injury/illness/exacerbation, and previous benefit from the skilled PT interventions  are also affecting patient's functional outcome.   REHAB POTENTIAL: Good  CLINICAL DECISION MAKING: Stable/uncomplicated  EVALUATION COMPLEXITY: Low  PLAN:  PT FREQUENCY: 1-2x/week  PT DURATION: 12 weeks  PLANNED INTERVENTIONS: 97164- PT Re-evaluation, 97110-Therapeutic exercises, 97530- Therapeutic activity, [id number]- Neuromuscular re-education, 97535- Self Care, [id number]- Manual therapy, [id number]- Gait training, [phone]- Orthotic Fit/training, Patient/Family education, Balance training, Stair training, Taping, Cryotherapy, and Moist heat  PLAN FOR NEXT SESSION:   Dynamic balance and strengthening for RLE. Pain management as indicated for R hip.   Massie Dollar PT, DPT  Physical Therapist - [place]  [facility]  10:26 AM 02/07/23

## 2023-02-12 ENCOUNTER — Ambulatory Visit: Payer: Medicaid Other

## 2023-02-12 ENCOUNTER — Ambulatory Visit: Payer: Medicaid Other | Admitting: Occupational Therapy

## 2023-02-12 ENCOUNTER — Ambulatory Visit: Payer: Medicaid Other | Admitting: Physical Therapy

## 2023-02-14 ENCOUNTER — Ambulatory Visit: Payer: Medicaid Other | Admitting: Physical Therapy

## 2023-02-19 ENCOUNTER — Ambulatory Visit: Payer: Medicaid Other

## 2023-02-19 DIAGNOSIS — R278 Other lack of coordination: Secondary | ICD-10-CM

## 2023-02-19 DIAGNOSIS — M6281 Muscle weakness (generalized): Secondary | ICD-10-CM

## 2023-02-19 DIAGNOSIS — Z8673 Personal history of transient ischemic attack (TIA), and cerebral infarction without residual deficits: Secondary | ICD-10-CM | POA: Diagnosis not present

## 2023-02-19 DIAGNOSIS — R41841 Cognitive communication deficit: Secondary | ICD-10-CM

## 2023-02-19 DIAGNOSIS — R4701 Aphasia: Secondary | ICD-10-CM

## 2023-02-19 DIAGNOSIS — R2681 Unsteadiness on feet: Secondary | ICD-10-CM

## 2023-02-19 DIAGNOSIS — R262 Difficulty in walking, not elsewhere classified: Secondary | ICD-10-CM

## 2023-02-19 DIAGNOSIS — R482 Apraxia: Secondary | ICD-10-CM

## 2023-02-19 NOTE — Therapy (Signed)
OUTPATIENT OCCUPATIONAL THERAPY NEURO TREATMENT  Patient Name: Cheyenne Martinez MRN: [id number] DOB:1975-07-17, 48 y.o., female Today's Date: 02/19/2023  PCP: Dr. Burnadette Pop REFERRING PROVIDER: Joellyn Haff, PA  END OF SESSION:  OT End of Session - 02/19/23 1100     Visit Number 5    Number of Visits 24    Date for OT Re-Evaluation 04/03/23    Authorization Time Period Reporting period beginning 01/09/23    OT Start Time 1100    OT Stop Time 1145    OT Time Calculation (min) 45 min    Activity Tolerance Patient tolerated treatment well    Behavior During Therapy Impulsive;Restless            Past Medical History:  Diagnosis Date   Diabetes mellitus without complication (HCC)    Hypertension    Stroke [facility])    History reviewed. No pertinent surgical history. Patient Active Problem List   Diagnosis Date Noted   CVA (cerebral vascular accident) (HCC) 07/01/2021   New onset type 2 diabetes mellitus (HCC) 07/01/2021   Dyslipidemia 07/01/2021   Hypertensive urgency 07/01/2021    ONSET DATE: 07/01/21  REFERRING DIAG: History of Cva, muscle weakness, lack of coordination  THERAPY DIAG:  No diagnosis found.  Rationale for Evaluation and Treatment: Rehabilitation  SUBJECTIVE:  SUBJECTIVE STATEMENT:  Pt . Reports doing well today. Pt accompanied by:  Mother, [name]  PERTINENT HISTORY:  Pt known to this OT as pt participated in OT in this clinic after suffering a CVA in June of '23.   Per this writer's OT d/c note on 11/09/21: Pt was seen by OT for 18 sessions to address visual and RUE deficits following L PCA stroke. Pt has made strong functional gains, as noted by increased FOTO score from 36 at eval to 60 at discharge. Pt engages her RUE into self care tasks as a fair-good stabilizer, but pt relies on L non-dominant arm for tasks which require strength and dexterity. Pt has increased attention to R side, requiring min vc on occasion for R hand proprioception. HEP was  reviewed this date with pt/mother acknowledging understanding and benefits of consistent/daily participation to help pt continue to improve use of the RUE. OT encouraged new therapy referral in the new year if pt wishes to continue to focus on residual deficits post stroke. Discharge completed this date d/t visit limitations with insurance within this calendar year.   PRECAUTIONS: Fall  WEIGHT BEARING RESTRICTIONS: No  PAIN:  Are you having pain? No  FALLS: Has patient fallen in last 6 months? No  LIVING ENVIRONMENT: Lives with: lives with their family, Adult daughter 41 years old, and 2 sons 75 and 33 years old; mom lives 5 min away Lives in: house Stairs: 4 steps, no rail Has following equipment at home: None (walk in shower and standard toilet)  PLOF: Independent prior to CVA  PATIENT GOALS: improve speech, use of arm, improve leg strength  OBJECTIVE:  Note: Objective measures were completed at Evaluation unless otherwise noted.  HAND DOMINANCE: Right  ADLs: Overall ADLs: Uses RUE as a fair stabilizer to non-dominant L arm Transfers/ambulation related to ADLs:  Eating: cuts food with L hand cut only Grooming: uses L hand to manipulate grooming utensils; requires assist for bimanual tasks such as putting up hair UB Dressing: indep to don a sports bra; unable to manage clothing fasteners LB Dressing: indep to don LB clothing including slip on shoes and elastic waist paints; unable to manage clothing fasteners Toileting:  indep with non-dominant hand Bathing: modified indep with limited use of the R dominant hand Tub Shower transfers: indep (walk in shower) Equipment: none  IADLs: Shopping: pt now driving, indep Light housekeeping: shared between pt and children Meal Prep: pt cooks daily, minimal use of the R dominant arm other than to stabilize meal prep items Community mobility: indep without use of AD Medication management: indep with pill Oncologist: indep on phone  Handwriting: Increased time  MOBILITY STATUS:  R sided hemiparesis; able to amb community distances without AD  POSTURE COMMENTS:  R sided hemiparesis Sitting balance: Moves/returns truncal midpoint >2 inches in all planes  ACTIVITY TOLERANCE: Activity tolerance: WFL for tasks assessed  FUNCTIONAL OUTCOME MEASURES: FOTO: 61; predicted 62  UPPER EXTREMITY ROM:   Active ROM Right eval Left eval  Shoulder flexion 70   Shoulder abduction 70   Shoulder adduction    Shoulder extension    Shoulder internal rotation Hand to iliac crest    Shoulder external rotation [facility]   Elbow flexion    Elbow extension    Wrist flexion    Wrist extension    Wrist ulnar deviation    Wrist radial deviation    Wrist pronation    Wrist supination 72   (Blank rows = not tested)  UPPER EXTREMITY MMT:     MMT Right eval Left eval  Shoulder flexion 3- 4+  Shoulder abduction 3+ (within available range) 4+  Shoulder adduction    Shoulder extension    Shoulder internal rotation    Shoulder external rotation    Middle trapezius    Lower trapezius    Elbow flexion 4- 4+  Elbow extension 4+ 4+  Wrist flexion 4- 4+  Wrist extension 4- 4+  Wrist ulnar deviation    Wrist radial deviation    Wrist pronation    Wrist supination    (Blank rows = not tested)  HAND FUNCTION: Grip strength: Right: 19 lbs; Left: 69 lbs, Lateral pinch: Right: 5 lbs, Left: 21 lbs, and 3 point pinch: Right: 6 lbs, Left: 21 lbs  COORDINATION: 9 Hole Peg test: Right: 3 pegs placed in 2 min 21 sec  sec; Left: 22 sec  SENSATION: Light touch: Impaired   EDEMA: No visible edema  MUSCLE TONE: RUE: Hypertonic  COGNITION: Overall cognitive status: Impaired; expressive and receptive aphasia  VISION ASSESSMENT: Not tested; limited by time constraints d/t pt arriving late to eval; will assess as needed in upcoming sessions/within functional contexts   PERCEPTION: Not tested this date; will  assess in upcoming sessions  PRAXIS: Impaired: Motor planning  OBSERVATIONS: Pt appears happy to be back to OT to begin a 2nd round of rehab, and motivated to work on strengthening the R side for improving use for daily tasks.   TODAY'S TREATMENT:  DATE: 02/19/2023:  Therapeutic Exercise: Pt. worked on the [employer] for 8 min. with constant monitoring of the BUEs on work level 2. Pt. worked on changing, and alternating forward/reverse for 2 min. With no rest breaks. Pt. worked on pinch strengthening in the R hand for lateral pinch using yellow, red, green, and blue resistive clips. Unable to place blue clips but removes from horizontal dowel. Pt. worked on placing the clips at various vertical and horizontal angles. Tactile and verbal cues were required for eliciting supination to place clips.   Neuromuscular Re-education: Pt worked on storing up to 6 [place] stones in palm with cues to fully supinate and minimize dropping from ulnar side of palm. Successfully moves 5 stones from distal palmar crease towards fingertips, unable to translate stones from palm. Pt focused on pinching stones from box to place in [place] board completing 20 stones.     PATIENT EDUCATION: Education details:  Right hand [facility] skills.  Person educated: Patient and Parent Education method: Explanation Education comprehension: verbalized understanding  HOME EXERCISE PROGRAM: To be initiated in upcoming sessions  GOALS: Goals reviewed with patient? Yes  SHORT TERM GOALS: Target date: 02/20/23  Pt will be indep to perform HEP for improving RUE strength and coordination for daily tasks. Baseline:  Eval: Not yet initiated Goal status: INITIAL  LONG TERM GOALS: Target date: 04/03/23  Pt will increase FOTO score to 65 or better to indicate improvement in self perceived functional use of the R arm  with daily tasks. Baseline: Eval: 61; predicted 62 Goal status: INITIAL  2.  Pt will increase R grip strength by 5-7 lbs in order to hold and transport ADL supplies in R dominant hand with increased confidence. Baseline: Eval: R grip strength 19 lbs. Goal status: INITIAL  3.  Pt will increase R hand FMC/dexterity skills to enable pt to engage the R hand in manipulation of clothing fasteners.  Baseline: Eval: Assist needed with clothing fasteners; R 9 hole peg test: able to place 3 pegs in 2 min 21 sec. Goal status: INITIAL  4.  Pt will increase active R shoulder flexion and abd by 20 or more degrees in order to bimanually reach for items in closet at shoulder level.   Baseline: Eval: R shoulder flexion and abd 70*; pt uses L arm to reach for items at or above shoulder level. Goal status: INITIAL  ASSESSMENT:  CLINICAL IMPRESSION:  Pt. requires cues, and assist for translatory movement patterns, and to supinate for lateral pinch and storing stones. Pt. Was able to tolerate the [employer] for the entire duration of the 8 min on level 2. Continues to present with difficulty moving stones through her hand from the palm to the tip of the 2nd digit, and thumb in preparation for discarding them from the hand. Pt. continues to benefit from additional skilled OT to target strengthening and coordination to better engage the RUE into daily tasks.   PERFORMANCE DEFICITS: in functional skills including ADLs, IADLs, coordination, dexterity, ROM, strength, flexibility, Fine motor control, Gross motor control, mobility, balance, body mechanics, decreased knowledge of use of DME, vision, and UE functional use, cognitive skills including thought and understand, and psychosocial skills including coping strategies, environmental adaptation, habits, interpersonal interactions, and routines and behaviors.   IMPAIRMENTS: are limiting patient from ADLs, IADLs, work, leisure, and social participation.   CO-MORBIDITIES:  has co-morbidities such as global aphasia, R sided hemiparesis, HTN  that affects occupational performance. Patient will benefit from skilled OT to address above  impairments and improve overall function.  MODIFICATION OR ASSISTANCE TO COMPLETE EVALUATION: No modification of tasks or assist necessary to complete an evaluation.  OT OCCUPATIONAL PROFILE AND HISTORY: Problem focused assessment: Including review of records relating to presenting problem.  CLINICAL DECISION MAKING: Moderate - several treatment options, min-mod task modification necessary  REHAB POTENTIAL: Good  EVALUATION COMPLEXITY: Moderate    PLAN:  OT FREQUENCY: 1-2x/week  OT DURATION: 12 weeks  PLANNED INTERVENTIONS: 97168 OT Re-evaluation, 97535 self care/ADL training, 44010 therapeutic exercise, 97530 therapeutic activity, 97112 neuromuscular re-education, 97140 manual therapy, 97116 gait training, 27253 moist heat, 97010 cryotherapy, passive range of motion, balance training, functional mobility training, visual/perceptual remediation/compensation, psychosocial skills training, energy conservation, coping strategies training, patient/family education, and DME and/or AE instructions  RECOMMENDED OTHER SERVICES: Pt has orders for SLP and PT  CONSULTED AND AGREED WITH PLAN OF CARE: Patient and family member/caregiver  PLAN FOR NEXT SESSION: See above  Kathie Dike, M.S. OTR/L  02/19/23, 11:01 AM  ascom [phone]  02/19/2023, 11:01 AM

## 2023-02-19 NOTE — Therapy (Signed)
OUTPATIENT SPEECH LANGUAGE PATHOLOGY APHASIA TREATMENT   Patient Name: Cheyenne Martinez MRN: [id number] DOB:02-09-1975, 48 y.o., female Today's Date: 02/19/2023  PCP: No PCP REFERRING PROVIDER: Ignacia Bayley, PA-C   End of Session - 02/19/23 1211     Visit Number 8    Number of Visits 24    Date for SLP Re-Evaluation 03/20/23    Authorization Type [place] [employer] [facility]    Authorization Time Period 27 combined PT/OT/ST visits until 01/30/24    Authorization - Visit Number 8    Authorization - Number of Visits 24    Progress Note Due on Visit 10    SLP Start Time 1015    SLP Stop Time  1100    SLP Time Calculation (min) 45 min    Activity Tolerance Patient tolerated treatment well             Past Medical History:  Diagnosis Date   Diabetes mellitus without complication (HCC)    Hypertension    Stroke (HCC)    No past surgical history on file. 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    PAIN:  Are you having pain? No  FALLS: Has patient fallen in last 6 months?  No  LIVING ENVIRONMENT: Lives with: lives with their family Lives in: House/apartment  PLOF:  Level of  assistance: Independent with ADLs   PATIENT GOALS to improve communication  OBJECTIVE:   DIAGNOSTIC FINDINGS: 07/01/2021 MRI BRAIN W WO CONTRAST  IMPRESSION:  1. Evolving early subacute left PCA distribution infarct as above,  with additional scattered small volume ischemic changes within the  left frontotemporal and right parietal lobe as above. Associated  mild petechial blood products at the left occipital lobe without  frank hemorrhagic transformation or significant mass effect.  2. Underlying mild chronic microvascular ischemic disease with  chronic right cerebellar infarct.    07/04/2021 CTA HEAD NECK W WO CONTRAST  IMPRESSION:  Evolving recent infarcts as seen on recent MRI. There is likely mild  petechial hemorrhage associated with the left parieto-occipital  infarct. No significant mass effect.  Plaque at the left greater than right ICA origins without  hemodynamically significant stenosis. Marked stenosis at the right  vertebral origin without apparent flow limitation.  Intracranial atherosclerosis involving anterior and posterior  circulations. No proximal intracranial vessel occlusion.    Today's Treatment:  Pt brought SGD to today's session. Pt answered biographical questions with 80% accuracy with use of total communication including SGD, improving to 100% with repetition of stimuli and written cues. Pt able to request specific meal and beverage for dinner tonight with use of SGD and spoken language. Pt verbally added "cheese" and "pepperoni" to her order for dinner. Pt requested SLP add her mother and brother to SGD. This prompted an  informal conversation about her brother with pt elaborating, "he's a hot mess" and "Dude" (is his nickname). Pt able navigate basic functions of SGD with min A. Total assistance for modifying device. Pt and mother shown how to access "Therapy" function on SGD. Explained that given pt's limited visits, the "Therapy" function may be  particularly helpful moving forward. Pt agreed to pursue obtaining personal SGD. SLP will be contacting [employer] to help facilitate the process to secure a device.  HEP: Explore SGD  PATIENT EDUCATION: Education details:as above Person educated: Patient and Engineer, structural , mother Education method: Explanation, Demonstration, and Verbal cues Education comprehension: needs further education   GOALS: Goals reviewed with patient? No  SHORT TERM GOALS: Target date: 10 sessions  Pt will participate in further assessment of functional reading, writing, and repetition.  Baseline:  Goal status: INITIAL  2.  Pt will follow 1-step directions with >50% accuracy with multimodal cues. Baseline:  Goal status: INITIAL  3.  Pt will repeat single words using a speech generating device in 50% of opportunities given frequent maximal verbal and maximal visual cues. Baseline:  Goal status: INITIAL  4. With Moderate A-Maximal A, patient will perform structured naming tasks with fair-good approximations 50% of the time.   Baseline:  Goal Status: INITIAL  5. Using multimodal communication and assistance as needed, pt will answer basic biographical questions with 60% accuracy.   Baseline:    Goal Status: INITIAL     LONG TERM GOALS: Target date: 03/20/23  With Min A, patient/family will demonstrate understanding of the following concepts: aphasia, spontaneous recovery, communication vs conversation, strengths/strategies to promote success in all communication settings. Baseline:  Goal status: INITIAL  2.  Using multimodal communication, pt will answer basic biographical questions in 8 out of 10 opportunities.  Baseline:  Goal status: INITIAL   ASSESSMENT:  CLINICAL IMPRESSION: Patient is a 48 y.o. female who was seen today for a speech-language treatment. 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 Pt's expressive communication is c/b severe word  finding deficits, neologisms, phonemic and semantic paraphasias. Pt with islands of error free speech and well as automatic speech. Pt with concomitant apraxia of speech.This results in reduced express basic wants and needs. Pt also presents with receptive language deficits that impact her ability to answer basic yes/no questions and follow basic directions, although auditory comprehension is a relative strength. See details of tx session above.   OBJECTIVE IMPAIRMENTS include expressive language, receptive language, and aphasia.These impairments are limiting patient from return to work, managing medications, managing appointments, managing finances, household responsibilities, ADLs/IADLs, and effectively communicating at home and in community. Factors affecting potential to achieve goals and functional outcome are severity of impairments and time since stroke . Patient will benefit from skilled SLP services to address above impairments and improve overall function.  REHAB POTENTIAL: Good  PLAN: SLP FREQUENCY: 1-2x/week  SLP DURATION: 12 weeks  PLANNED INTERVENTIONS: Language facilitation, Cueing hierachy, Internal/external aids, Functional tasks, Multimodal communication approach, and SLP instruction and feedback   Clyde Canterbury, M.S., CCC-SLP Speech-Language Pathologist [facility] [phone] Arnette Felts)  [place] [facility] MAIN [facility] SERVICES [address] [place], [place], [address] Phone: [phone]   Fax:  [phone]

## 2023-02-19 NOTE — Therapy (Signed)
OUTPATIENT PHYSICAL THERAPY NEURO TREATMENT   Patient Name: Cheyenne Martinez MRN: [id number] DOB:[birth year], 48 y.o., female Today's Date: 02/19/2023   PCP:  No PCP  REFERRING PROVIDER:  Ignacia Bayley PA  END OF SESSION:  PT End of Session - 02/19/23 0942     Visit Number 6    Number of Visits 24    Date for PT Re-Evaluation 03/20/23    Progress Note Due on Visit 10    PT Start Time 0931    PT Stop Time 1013    PT Time Calculation (min) 42 min    Equipment Utilized During Treatment Gait belt    Activity Tolerance Patient tolerated treatment well    Behavior During Therapy WFL for tasks assessed/performed               Past Medical History:  Diagnosis Date   Diabetes mellitus without complication (HCC)    Hypertension    Stroke [facility])    History reviewed. No pertinent surgical history. Patient Active Problem List   Diagnosis Date Noted   CVA (cerebral vascular accident) (HCC) 07/01/2021   New onset type 2 diabetes mellitus (HCC) 07/01/2021   Dyslipidemia 07/01/2021   Hypertensive urgency 07/01/2021    ONSET DATE:  07/01/2021  REFERRING [id number] (ICD-10-CM) - Personal history of transient ischemic attack (TIA), and cerebral infarction without residual deficits  THERAPY DIAG:   Muscle weakness (generalized)   Unsteadiness on feet   Hemiplegia and hemiparesis following cerebral infarction affecting  Right dominant side (HCC)   Other abnormalities of gait and mobility   Acute ischemic L ACA stroke (HCC)  Rationale for Evaluation and Treatment: Rehabilitation  SUBJECTIVE:                                                                                                                                                                                             SUBJECTIVE STATEMENT:   Patient reports doing okay and no recent falls. Patient reports still wanting each discipline split evenly for now.     Pt accompanied by:  Mother  PERTINENT HISTORY: Pt is a 48  y/o female who had 2 episodes of stroke and has received Physical therapy in the same OP clinic and has returned due to decline in functional mobility and  pain RLE with WB activities.  Pt with PMHx:   CVA x 2,   type 2 diabetes mellitus (HCC)   Hypertensive urgency   Dyslipidemia  PAIN:  Are you having pain? Yes: Pain location: 0/10 Pain description: sharp Aggravating factors: WB Relieving factors: Rest  PRECAUTIONS: Fall  RED FLAGS: None   WEIGHT  BEARING RESTRICTIONS: No  FALLS: Has patient fallen in last 6 months? No  LIVING ENVIRONMENT: Lives with: lives with their family Lives in: House/apartment Stairs: Yes: External: 4 steps; can reach both Has following equipment at home: None  PLOF: Independent with basic ADLs, Independent with household mobility without device, Independent with homemaking with ambulation, Independent with gait, Independent with transfers, and Leisure: driving  PATIENT GOALS: " I want to walk better and without falling and pain.   OBJECTIVE:  Note: Objective measures were completed at Evaluation unless otherwise noted.  DIAGNOSTIC FINDINGS: None for 2024 07/04/2021:  Evolving recent infarcts as seen on recent MRI. There is likely mild petechial hemorrhage associated with the left parieto-occipital infarct. No significant mass effect.   Plaque at the left greater than right ICA origins without hemodynamically significant stenosis. Marked stenosis at the right vertebral origin without apparent flow limitation.   Intracranial atherosclerosis involving anterior and posterior circulations. No proximal intracranial vessel occlusion.  COGNITION: Overall cognitive status: Within functional limits for tasks assessed   SENSATION: WFL  COORDINATION:   MUSCLE TONE: RLE: Moderate and Hypertonic  MUSCLE LENGTH: Hamstrings: Right 20 deg; Left WFL deg   POSTURE: rounded shoulders and R sided lean  LOWER EXTREMITY ROM:    PROM WFL AROM WFL     LOWER EXTREMITY MMT:    MMT Right Eval Left Eval  Hip flexion 3/5 4-/5  Hip extension 3/5 4/5  Hip abduction 3/5 4/5  Hip adduction 4-/5 4/5  Hip internal rotation 3/5 4-/5  Hip external rotation 3/5 4-/5  Knee flexion 3+/5 4/5  Knee extension 3/5 4/5  Ankle dorsiflexion 3-/5 4/5  Ankle plantarflexion    Ankle inversion    Ankle eversion    (Blank rows = not tested)  BED MOBILITY:  Independent  TRANSFERS: Assistive device utilized: None  Sit to stand: Complete Independence Stand to sit: Complete Independence Chair to chair: Complete Independence Floor: SBA  RAMP:  Level of Assistance: CGA Assistive device utilized: None Ramp Comments: Pt needs CGA to ascend and Min to descend.   CURB:  Level of Assistance: CGA Assistive device utilized: None Curb Comments: Due to weakness, unsteady on feet and difficulty raising up concentrically.   STAIRS: Level of Assistance: CGA Stair Negotiation Technique: Step to Pattern Forwards with Single Rail on Left Number of Stairs: 4  Height of Stairs: 6 inch  Comments: Slow with heavy reliance on HR with one step a t a time.   GAIT: Gait pattern: step through pattern, decreased arm swing- Right, decreased step length- Right, decreased stance time- Left, decreased stride length, decreased ankle dorsiflexion- Right, and genu recurvatum- Right Distance walked: 18ft Assistive device utilized: None Level of assistance: SBA Comments: Impulsive, and needs VC for safety and proper technique  FUNCTIONAL TESTS:  8 reps in 30 seconds chair stand test Timed up and go (TUG): 13secs 10 meter walk test: 12 secs= 1.49m/sec Balance Test: Unable to Tandem stand, unable to SL stand, Rhomberg on NBOS x 20 secs  PATIENT SURVEYS:  LEFS 56/80 FOTO Complete FOTO next session.  TODAY'S TREATMENT:  DATE:    Therex:  Standing in // bars Hip swing- fwd/bwd- 2 sets of 10 reps each Hip circles (LE around yoga block) 2 sets of 10 reps each Side stepping with partial squat in // bars - down and back x 2 laps  Terminal knee ext with BTB - 2 sets of 10 reps  Lateral Step over 1/2 foam x 10 bil    Sitting in chair:   Sit to stand with airex pad under left LE x 10 reps  Sit to stand without airex pad under left LE x 10 reps  Sit to stand without UE support - with slow eccentric control (5 sec count) x 10 reps  Seated toe raises- emphasis on right LE- VC for height 2 sets of 10 reps  CGA for safety with all transfers/standing   PATIENT EDUCATION: Education details: Safety, findings, rehab progression, significance of HEP Person educated: Patient and Parent Education method: Explanation Education comprehension: verbalized understanding, verbal cues required, and needs further education  HOME EXERCISE PROGRAM:  Access Code: [email or web address] URL: [email or web address] Date: 02/19/2023 Prepared by: Maureen Ralphs  Exercises - Standing Single Leg Hip Circles  - 3 x weekly - 3 sets - 10 reps - Standing Terminal Knee Extension with Resistance  - 3 x weekly - 3 sets - 10 reps   Access Code: [email or web address] URL: [email or web address] Date: 01/01/2023 Prepared by: Grier Rocher  Exercises - Seated Long Arc Quad  - 1 x daily - 7 x weekly - 3 sets - 10 reps - Seated Heel Slide  - 1 x daily - 7 x weekly - 3 sets - 10 reps - 2 hold - Sit to Stand  - 1 x daily - 7 x weekly - 3 sets - 10 reps - Seated Toe Raise  - 1 x daily - 7 x weekly - 3 sets - 15 reps - 2 hold - Standing Alternating Knee Flexion  - 1 x daily - 7 x weekly - 3 sets - 10 reps - 2 hold   GOALS: Goals reviewed with patient? Yes  SHORT TERM GOALS: Target date: 01/12/2023  Patient will be independent in home exercise program to improve strength/mobility for better functional independence with ADLs.  Baseline: HEP  administered 12/26/2022 Goal status: INITIAL   LONG TERM GOALS: Target date: 03/22/2022  Patient will complete >12 times sit to stand test in 30 seconds indicating an increased LE strength and improved balance.  Baseline: 8 reps in 30 secs Goal status: INITIAL  2.  Pt will improve LEFS score >64/80  Baseline: 56/80 Goal status: INITIAL  3.  Pt will improve Hamstring Length to <10 degrees to improve LE function and balance Baseline: 20 deg Hamstring lenght Goal status: INITIAL  4.  Pt will tandem stand for 30 secs without LOB.  Baseline: Unable Goal status: INITIAL  5.Pt will complete TUG in <10 secs to improve balance and safety with all functional mobility.  Baseline: 13secs Goal status: INITIAL  6.  Pt will complete 10 M gait speed in <9 secs to demonstrate safety with community level ambulation Baseline: 1.75m/sec Goal status: INITIAL  7.  Pt will improve FOTO score by 5 points  Baseline: 64 Goal status: INITIAL   ASSESSMENT:  CLINICAL IMPRESSION:   Treatment continued to focus on Right LE mobility and strengthening. Able to add some hip mobility to decrease stiffness and patient able to perform better without report of hip soreness today. She was fatigued at end of session but able to  follow all VC and visual demonstration and complete requested reps/sets.  Pt will benefit from continued skilled PT interventions to address impairments and help pt achieve her goals.   OBJECTIVE IMPAIRMENTS: Abnormal gait, decreased activity tolerance, decreased balance, decreased coordination, decreased endurance, decreased mobility, difficulty walking, decreased ROM, decreased strength, and decreased safety awareness.   ACTIVITY LIMITATIONS: lifting, bending, stairs, and locomotion level  PARTICIPATION LIMITATIONS: driving, shopping, community activity, and stairs  PERSONAL FACTORS: Age, Time since onset of injury/illness/exacerbation, and previous benefit from the skilled PT  interventions  are also affecting patient's functional outcome.   REHAB POTENTIAL: Good  CLINICAL DECISION MAKING: Stable/uncomplicated  EVALUATION COMPLEXITY: Low  PLAN:  PT FREQUENCY: 1-2x/week  PT DURATION: 12 weeks  PLANNED INTERVENTIONS: 97164- PT Re-evaluation, 97110-Therapeutic exercises, 97530- Therapeutic activity, [id number]- Neuromuscular re-education, 97535- Self Care, [id number]- Manual therapy, [id number]- Gait training, [id number]- Orthotic Fit/training, Patient/Family education, Balance training, Stair training, Taping, Cryotherapy, and Moist heat  PLAN FOR NEXT SESSION:   Dynamic balance and strengthening for RLE. Pain management as indicated for R hip.   Louis Meckel, PT Physical Therapist - [place]  [facility]  1:21 PM 02/19/23

## 2023-02-21 ENCOUNTER — Ambulatory Visit: Payer: Medicaid Other | Admitting: Physical Therapy

## 2023-02-22 NOTE — Addendum Note (Signed)
Addended byBlinda Leatherwood, Serita Degroote H on: 02/22/2023 11:23 AM   Modules accepted: Orders

## 2023-02-26 ENCOUNTER — Ambulatory Visit: Payer: Medicaid Other | Admitting: Physical Therapy

## 2023-02-26 ENCOUNTER — Ambulatory Visit: Payer: Medicaid Other | Admitting: Occupational Therapy

## 2023-02-26 ENCOUNTER — Ambulatory Visit: Payer: Medicaid Other

## 2023-02-26 DIAGNOSIS — R482 Apraxia: Secondary | ICD-10-CM

## 2023-02-26 DIAGNOSIS — R4701 Aphasia: Secondary | ICD-10-CM

## 2023-02-26 DIAGNOSIS — M6281 Muscle weakness (generalized): Secondary | ICD-10-CM

## 2023-02-26 DIAGNOSIS — R2681 Unsteadiness on feet: Secondary | ICD-10-CM

## 2023-02-26 DIAGNOSIS — Z8673 Personal history of transient ischemic attack (TIA), and cerebral infarction without residual deficits: Secondary | ICD-10-CM | POA: Diagnosis not present

## 2023-02-26 DIAGNOSIS — R262 Difficulty in walking, not elsewhere classified: Secondary | ICD-10-CM

## 2023-02-26 NOTE — Therapy (Signed)
OUTPATIENT PHYSICAL THERAPY NEURO TREATMENT   Patient Name: Cheyenne Martinez MRN: [id number] DOB:10-25-1975, 48 y.o., female Today's Date: 02/26/2023   PCP:  No PCP  REFERRING PROVIDER:  Ignacia Bayley PA  END OF SESSION:  PT End of Session - 02/26/23 0837     Visit Number 7    Number of Visits 24    Date for PT Re-Evaluation 03/20/23    Progress Note Due on Visit 10    PT Start Time 0930    PT Stop Time 1014    PT Time Calculation (min) 44 min    Equipment Utilized During Treatment Gait belt    Activity Tolerance Patient tolerated treatment well    Behavior During Therapy WFL for tasks assessed/performed                Past Medical History:  Diagnosis Date   Diabetes mellitus without complication (HCC)    Hypertension    Stroke (HCC)    No past surgical history on file. Patient Active Problem List   Diagnosis Date Noted   CVA (cerebral vascular accident) (HCC) 07/01/2021   New onset type 2 diabetes mellitus (HCC) 07/01/2021   Dyslipidemia 07/01/2021   Hypertensive urgency 07/01/2021    ONSET DATE:  07/01/2021  REFERRING JXBJ:Y78.29 (ICD-10-CM) - Personal history of transient ischemic attack (TIA), and cerebral infarction without residual deficits  THERAPY DIAG:   Muscle weakness (generalized)   Unsteadiness on feet   Hemiplegia and hemiparesis following cerebral infarction affecting  Right dominant side (HCC)   Other abnormalities of gait and mobility   Acute ischemic L ACA stroke (HCC)  Rationale for Evaluation and Treatment: Rehabilitation  SUBJECTIVE:                                                                                                                                                                                             SUBJECTIVE STATEMENT:   Patient reports doing okay and no recent falls. Pt brother expresses she may be interested in swedish knee cage.    Pt accompanied by:  Mother  PERTINENT HISTORY: Pt is a 48 y/o female who had  2 episodes of stroke and has received Physical therapy in the same OP clinic and has returned due to decline in functional mobility and  pain RLE with WB activities.  Pt with PMHx:   CVA x 2,   type 2 diabetes mellitus (HCC)   Hypertensive urgency   Dyslipidemia  PAIN:  Are you having pain? Yes: Pain location: 0/10 Pain description: sharp Aggravating factors: WB Relieving factors: Rest  PRECAUTIONS: Fall  RED FLAGS: None  WEIGHT BEARING RESTRICTIONS: No  FALLS: Has patient fallen in last 6 months? No  LIVING ENVIRONMENT: Lives with: lives with their family Lives in: House/apartment Stairs: Yes: External: 4 steps; can reach both Has following equipment at home: None  PLOF: Independent with basic ADLs, Independent with household mobility without device, Independent with homemaking with ambulation, Independent with gait, Independent with transfers, and Leisure: driving  PATIENT GOALS: " I want to walk better and without falling and pain.   OBJECTIVE:  Note: Objective measures were completed at Evaluation unless otherwise noted.  DIAGNOSTIC FINDINGS: None for 2024 07/04/2021:  Evolving recent infarcts as seen on recent MRI. There is likely mild petechial hemorrhage associated with the left parieto-occipital infarct. No significant mass effect.   Plaque at the left greater than right ICA origins without hemodynamically significant stenosis. Marked stenosis at the right vertebral origin without apparent flow limitation.   Intracranial atherosclerosis involving anterior and posterior circulations. No proximal intracranial vessel occlusion.  COGNITION: Overall cognitive status: Within functional limits for tasks assessed   SENSATION: WFL  COORDINATION:   MUSCLE TONE: RLE: Moderate and Hypertonic  MUSCLE LENGTH: Hamstrings: Right 20 deg; Left WFL deg   POSTURE: rounded shoulders and R sided lean  LOWER EXTREMITY ROM:    PROM WFL AROM WFL    LOWER EXTREMITY  MMT:    MMT Right Eval Left Eval  Hip flexion 3/5 4-/5  Hip extension 3/5 4/5  Hip abduction 3/5 4/5  Hip adduction 4-/5 4/5  Hip internal rotation 3/5 4-/5  Hip external rotation 3/5 4-/5  Knee flexion 3+/5 4/5  Knee extension 3/5 4/5  Ankle dorsiflexion 3-/5 4/5  Ankle plantarflexion    Ankle inversion    Ankle eversion    (Blank rows = not tested)  BED MOBILITY:  Independent  TRANSFERS: Assistive device utilized: None  Sit to stand: Complete Independence Stand to sit: Complete Independence Chair to chair: Complete Independence Floor: SBA  RAMP:  Level of Assistance: CGA Assistive device utilized: None Ramp Comments: Pt needs CGA to ascend and Min to descend.   CURB:  Level of Assistance: CGA Assistive device utilized: None Curb Comments: Due to weakness, unsteady on feet and difficulty raising up concentrically.   STAIRS: Level of Assistance: CGA Stair Negotiation Technique: Step to Pattern Forwards with Single Rail on Left Number of Stairs: 4  Height of Stairs: 6 inch  Comments: Slow with heavy reliance on HR with one step a t a time.   GAIT: Gait pattern: step through pattern, decreased arm swing- Right, decreased step length- Right, decreased stance time- Left, decreased stride length, decreased ankle dorsiflexion- Right, and genu recurvatum- Right Distance walked: 82ft Assistive device utilized: None Level of assistance: SBA Comments: Impulsive, and needs VC for safety and proper technique  FUNCTIONAL TESTS:  8 reps in 30 seconds chair stand test Timed up and go (TUG): 13secs 10 meter walk test: 12 secs= 1.74m/sec Balance Test: Unable to Tandem stand, unable to SL stand, Rhomberg on NBOS x 20 secs  PATIENT SURVEYS:  LEFS 56/80 FOTO Complete FOTO next session.  TODAY'S TREATMENT:  DATE:    Therex: Seated LAQ with 5 sec  hold x 10 reps  Seated march with hold 2 x 10 reps with 3 sec hold Seated elevated on airex 2 x 10 RTB hamstring curls   Standing by support bar Lateral Step over 1/2 foam x 10 bil  Standing hip extension 2 x 10 reps B LE     TA Ladder ambulation x 2 laps, unable to maintain balance with R LE stance phase of gait.  Sit to stand with airex pad under left LE 2 x 10 reps  Standing step tap anterior alternating LE x 10 ea  CGA for safety with all transfers/standing   PATIENT EDUCATION: Education details: Safety, findings, rehab progression, significance of HEP Person educated: Patient and Parent Education method: Explanation Education comprehension: verbalized understanding, verbal cues required, and needs further education  HOME EXERCISE PROGRAM:  Access Code: [email or web address] URL: [email or web address] Date: 02/19/2023 Prepared by: Maureen Ralphs  Exercises - Standing Single Leg Hip Circles  - 3 x weekly - 3 sets - 10 reps - Standing Terminal Knee Extension with Resistance  - 3 x weekly - 3 sets - 10 reps   Access Code: [email or web address] URL: [email or web address] Date: 01/01/2023 Prepared by: Grier Rocher  Exercises - Seated Long Arc Quad  - 1 x daily - 7 x weekly - 3 sets - 10 reps - Seated Heel Slide  - 1 x daily - 7 x weekly - 3 sets - 10 reps - 2 hold - Sit to Stand  - 1 x daily - 7 x weekly - 3 sets - 10 reps - Seated Toe Raise  - 1 x daily - 7 x weekly - 3 sets - 15 reps - 2 hold - Standing Alternating Knee Flexion  - 1 x daily - 7 x weekly - 3 sets - 10 reps - 2 hold   GOALS: Goals reviewed with patient? Yes  SHORT TERM GOALS: Target date: 01/12/2023  Patient will be independent in home exercise program to improve strength/mobility for better functional independence with ADLs.  Baseline: HEP administered 12/26/2022 Goal status: INITIAL   LONG TERM GOALS: Target date: 03/22/2022  Patient will complete >12 times sit to stand test in 30  seconds indicating an increased LE strength and improved balance.  Baseline: 8 reps in 30 secs Goal status: INITIAL  2.  Pt will improve LEFS score >64/80  Baseline: 56/80 Goal status: INITIAL  3.  Pt will improve Hamstring Length to <10 degrees to improve LE function and balance Baseline: 20 deg Hamstring lenght Goal status: INITIAL  4.  Pt will tandem stand for 30 secs without LOB.  Baseline: Unable Goal status: INITIAL  5.Pt will complete TUG in <10 secs to improve balance and safety with all functional mobility.  Baseline: 13secs Goal status: INITIAL  6.  Pt will complete 10 M gait speed in <9 secs to demonstrate safety with community level ambulation Baseline: 1.65m/sec Goal status: INITIAL  7.  Pt will improve FOTO score by 5 points  Baseline: 64 Goal status: INITIAL   ASSESSMENT:  CLINICAL IMPRESSION:    Patient presents with good motivation for completion of physical therapy activities.  Patient continues to focus on right lower extremity muscle activation and strengthening.  Patient does well with all interventions has difficulty with maintaining isometric holds of right lower extremity and reports high difficulty with this.  Physical therapist will work on getting a order for a Swedish knee cage on her right lower extremity to  prevent knee hyperextension with gait. Pt will continue to benefit from skilled physical therapy intervention to address impairments, improve QOL, and attain therapy goals.    OBJECTIVE IMPAIRMENTS: Abnormal gait, decreased activity tolerance, decreased balance, decreased coordination, decreased endurance, decreased mobility, difficulty walking, decreased ROM, decreased strength, and decreased safety awareness.   ACTIVITY LIMITATIONS: lifting, bending, stairs, and locomotion level  PARTICIPATION LIMITATIONS: driving, shopping, community activity, and stairs  PERSONAL FACTORS: Age, Time since onset of injury/illness/exacerbation, and previous  benefit from the skilled PT interventions  are also affecting patient's functional outcome.   REHAB POTENTIAL: Good  CLINICAL DECISION MAKING: Stable/uncomplicated  EVALUATION COMPLEXITY: Low  PLAN:  PT FREQUENCY: 1-2x/week  PT DURATION: 12 weeks  PLANNED INTERVENTIONS: 97164- PT Re-evaluation, 97110-Therapeutic exercises, 97530- Therapeutic activity, [id number]- Neuromuscular re-education, 97535- Self Care, 16109- Manual therapy, [id number]- Gait training, [phone]- Orthotic Fit/training, Patient/Family education, Balance training, Stair training, Taping, Cryotherapy, and Moist heat  PLAN FOR NEXT SESSION:   Dynamic balance and strengthening for RLE. Pain management as indicated for R hip.  Follow-up on Swedish knee cage  Norman Herrlich PT ,DPT Physical Therapist- [place]  [facility]   8:37 AM 02/26/23

## 2023-02-26 NOTE — Therapy (Signed)
P OUTPATIENT SPEECH LANGUAGE PATHOLOGY APHASIA TREATMENT   Patient Name: Cheyenne Martinez MRN: [id number] DOB:06/20/1975, 48 y.o., female Today's Date: 02/26/2023  PCP: No PCP REFERRING PROVIDER: Ignacia Bayley, PA-C   End of Session - 02/26/23 0929     Visit Number 9    Number of Visits 24    Date for SLP Re-Evaluation 03/20/23    Authorization Type [place] Medicaid Prepaid [facility]    Authorization Time Period 27 combined PT/OT/ST visits until 01/30/24    Authorization - Visit Number 9    Authorization - Number of Visits 24    Progress Note Due on Visit 10    SLP Start Time 0845    SLP Stop Time  0930    SLP Time Calculation (min) 45 min    Activity Tolerance Patient tolerated treatment well             Past Medical History:  Diagnosis Date   Diabetes mellitus without complication (HCC)    Hypertension    Stroke (HCC)    No past surgical history on file. Patient Active Problem List   Diagnosis Date Noted   CVA (cerebral vascular accident) (HCC) 07/01/2021   New onset type 2 diabetes mellitus (HCC) 07/01/2021   Dyslipidemia 07/01/2021   Hypertensive urgency 07/01/2021    ONSET DATE: 07/01/2021   REFERRING DIAG: Cerebral infarction involving left posterior cerebral artery (HCC)  THERAPY DIAG:  Aphasia  Cerebral infarction involving left posterior cerebral artery (HCC)  Rationale for Evaluation and Treatment Rehabilitation  SUBJECTIVE:   SUBJECTIVE STATEMENT: Pt pleasant, motivated Pt accompanied by: self and family member; brother  PERTINENT HISTORY: Patient is a 48 year old female with history of untreated hypertension, hyperlipidemia, and type II diabetes presented to [facility] ED on 07/01/2021 with report of sudden onset of memory difficulty and slurred speech.    PAIN:  Are you having pain? No  FALLS: Has patient fallen in last 6 months?  No  LIVING ENVIRONMENT: Lives with: lives with their family Lives in: House/apartment  PLOF:  Level of assistance:  Independent with ADLs   PATIENT GOALS to improve communication  OBJECTIVE:   DIAGNOSTIC FINDINGS: 07/01/2021 MRI BRAIN W WO CONTRAST  IMPRESSION:  1. Evolving early subacute left PCA distribution infarct as above,  with additional scattered small volume ischemic changes within the  left frontotemporal and right parietal lobe as above. Associated  mild petechial blood products at the left occipital lobe without  frank hemorrhagic transformation or significant mass effect.  2. Underlying mild chronic microvascular ischemic disease with  chronic right cerebellar infarct.    07/04/2021 CTA HEAD NECK W WO CONTRAST  IMPRESSION:  Evolving recent infarcts as seen on recent MRI. There is likely mild  petechial hemorrhage associated with the left parieto-occipital  infarct. No significant mass effect.  Plaque at the left greater than right ICA origins without  hemodynamically significant stenosis. Marked stenosis at the right  vertebral origin without apparent flow limitation.  Intracranial atherosclerosis involving anterior and posterior  circulations. No proximal intracranial vessel occlusion.    Today's Treatment:  Pt followed 1-step directions with 75% accuracy; improving to 90% with multimodal cues.     During structured naming tasks (e.g. fill in, confrontation), pt provided fair-good approximations 60% of the time indep; improving to 80% with hierarchical cueing.    Pt repeated 3-4 word phrases with 80% accuracy indep; improving to 90% with hierarchical cueing. Marked improvement in repetition noted.   Utilizing multimodal communication including SGD, writing, speech,  and gestures, pt able to tell SLP what she did over the weekend.    Introduced Therapy tab on [employer] device. Pt to utilize for HEP. Additional handouts provided re: device modification and use.  HEP: Explore SGD, HEP on SGD  PATIENT EDUCATION: Education details:as above Person educated: Patient Education  method: Explanation, Demonstration, and Verbal cues Education comprehension: needs further education   GOALS: Goals reviewed with patient? No  SHORT TERM GOALS: Target date: 10 sessions  Pt will participate in further assessment of functional reading, writing, and repetition.  Baseline:  Goal status: INITIAL  2.  Pt will follow 1-step directions with >50% accuracy with multimodal cues. Baseline:  Goal status: INITIAL  3.  Pt will repeat single words using a speech generating device in 50% of opportunities given frequent maximal verbal and maximal visual cues. Baseline:  Goal status: INITIAL  4. With Moderate A-Maximal A, patient will perform structured naming tasks with fair-good approximations 50% of the time.   Baseline:  Goal Status: INITIAL  5. Using multimodal communication and assistance as needed, pt will answer basic biographical questions with 60% accuracy.   Baseline:    Goal Status: INITIAL     LONG TERM GOALS: Target date: 03/20/23  With Min A, patient/family will demonstrate understanding of the following concepts: aphasia, spontaneous recovery, communication vs conversation, strengths/strategies to promote success in all communication settings. Baseline:  Goal status: INITIAL  2.  Using multimodal communication, pt will answer basic biographical questions in 8 out of 10 opportunities.  Baseline:  Goal status: INITIAL   ASSESSMENT:  CLINICAL IMPRESSION: Patient is a 48 y.o. female who was seen today for a speech-language treatment. 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 Pt's expressive communication is c/b severe word finding deficits, neologisms, phonemic and semantic paraphasias. Pt with islands of error free speech and well as automatic speech. Pt with concomitant apraxia of speech.This results in reduced express basic wants and needs. Pt also presents with receptive language deficits that impact her ability to  answer basic yes/no questions and follow basic directions, although auditory comprehension is a relative strength. See details of tx session above.   OBJECTIVE IMPAIRMENTS include expressive language, receptive language, and aphasia.These impairments are limiting patient from return to work, managing medications, managing appointments, managing finances, household responsibilities, ADLs/IADLs, and effectively communicating at home and in community. Factors affecting potential to achieve goals and functional outcome are severity of impairments and time since stroke . Patient will benefit from skilled SLP services to address above impairments and improve overall function.  REHAB POTENTIAL: Good  PLAN: SLP FREQUENCY: 1-2x/week  SLP DURATION: 12 weeks  PLANNED INTERVENTIONS: Language facilitation, Cueing hierachy, Internal/external aids, Functional tasks, Multimodal communication approach, and SLP instruction and feedback   Clyde Canterbury, M.S., CCC-SLP Speech-Language Pathologist [place] [facility] [phone] Arnette Felts)  [place] [facility] MAIN [facility] SERVICES [address] [place], [place], [address] Phone: [phone]   Fax:  [phone]

## 2023-02-26 NOTE — Therapy (Signed)
OUTPATIENT OCCUPATIONAL THERAPY NEURO TREATMENT  Patient Name: Cheyenne Martinez MRN: [id number] DOB:Dec 05, 1975, 48 y.o., female Today's Date: 02/26/2023  PCP: Dr. Burnadette Pop REFERRING PROVIDER: Joellyn Haff, PA  END OF SESSION:  OT End of Session - 02/26/23 0856     Visit Number 6    Number of Visits 24    Date for OT Re-Evaluation 04/03/23    Authorization Time Period Reporting period beginning 01/09/23    OT Start Time 0807    OT Stop Time 0845    OT Time Calculation (min) 38 min    Activity Tolerance Patient tolerated treatment well    Behavior During Therapy [facility] for tasks assessed/performed            Past Medical History:  Diagnosis Date   Diabetes mellitus without complication (HCC)    Hypertension    Stroke (HCC)    No past surgical history on file. Patient Active Problem List   Diagnosis Date Noted   CVA (cerebral vascular accident) (HCC) 07/01/2021   New onset type 2 diabetes mellitus (HCC) 07/01/2021   Dyslipidemia 07/01/2021   Hypertensive urgency 07/01/2021    ONSET DATE: 07/01/21  REFERRING DIAG: History of Cva, muscle weakness, lack of coordination  THERAPY DIAG:  Muscle weakness (generalized)  Rationale for Evaluation and Treatment: Rehabilitation  SUBJECTIVE:  SUBJECTIVE STATEMENT:  Pt. reports doing well today Pt accompanied by:  Her Brother  PERTINENT HISTORY:  Pt known to this OT as pt participated in OT in this clinic after suffering a CVA in June of '23.   Per this writer's OT d/c note on 11/09/21: Pt was seen by OT for 18 sessions to address visual and RUE deficits following L PCA stroke. Pt has made strong functional gains, as noted by increased FOTO score from 36 at eval to 60 at discharge. Pt engages her RUE into self care tasks as a fair-good stabilizer, but pt relies on L non-dominant arm for tasks which require strength and dexterity. Pt has increased attention to R side, requiring min vc on occasion for R hand proprioception. HEP was  reviewed this date with pt/mother acknowledging understanding and benefits of consistent/daily participation to help pt continue to improve use of the RUE. OT encouraged new therapy referral in the new year if pt wishes to continue to focus on residual deficits post stroke. Discharge completed this date d/t visit limitations with insurance within this calendar year.   PRECAUTIONS: Fall  WEIGHT BEARING RESTRICTIONS: No  PAIN:  Are you having pain? No  FALLS: Has patient fallen in last 6 months? No  LIVING ENVIRONMENT: Lives with: lives with their family, Adult daughter 58 years old, and 2 sons 62 and 43 years old; mom lives 5 min away Lives in: house Stairs: 4 steps, no rail Has following equipment at home: None (walk in shower and standard toilet)  PLOF: Independent prior to CVA  PATIENT GOALS: improve speech, use of arm, improve leg strength  OBJECTIVE:  Note: Objective measures were completed at Evaluation unless otherwise noted.  HAND DOMINANCE: Right  ADLs: Overall ADLs: Uses RUE as a fair stabilizer to non-dominant L arm Transfers/ambulation related to ADLs:  Eating: cuts food with L hand cut only Grooming: uses L hand to manipulate grooming utensils; requires assist for bimanual tasks such as putting up hair UB Dressing: indep to don a sports bra; unable to manage clothing fasteners LB Dressing: indep to don LB clothing including slip on shoes and elastic waist paints; unable to manage clothing  fasteners Toileting: indep with non-dominant hand Bathing: modified indep with limited use of the R dominant hand Tub Shower transfers: indep (walk in shower) Equipment: none  IADLs: Shopping: pt now driving, indep Light housekeeping: shared between pt and children Meal Prep: pt cooks daily, minimal use of the R dominant arm other than to stabilize meal prep items Community mobility: indep without use of AD Medication management: indep with pill Oncologist: indep on phone  Handwriting: Increased time  MOBILITY STATUS:  R sided hemiparesis; able to amb community distances without AD  POSTURE COMMENTS:  R sided hemiparesis Sitting balance: Moves/returns truncal midpoint >2 inches in all planes  ACTIVITY TOLERANCE: Activity tolerance: WFL for tasks assessed  FUNCTIONAL OUTCOME MEASURES: FOTO: 61; predicted 62  UPPER EXTREMITY ROM:   Active ROM Right eval Left eval  Shoulder flexion 70   Shoulder abduction 70   Shoulder adduction    Shoulder extension    Shoulder internal rotation Hand to iliac crest    Shoulder external rotation [facility]   Elbow flexion    Elbow extension    Wrist flexion    Wrist extension    Wrist ulnar deviation    Wrist radial deviation    Wrist pronation    Wrist supination 72   (Blank rows = not tested)  UPPER EXTREMITY MMT:     MMT Right eval Left eval  Shoulder flexion 3- 4+  Shoulder abduction 3+ (within available range) 4+  Shoulder adduction    Shoulder extension    Shoulder internal rotation    Shoulder external rotation    Middle trapezius    Lower trapezius    Elbow flexion 4- 4+  Elbow extension 4+ 4+  Wrist flexion 4- 4+  Wrist extension 4- 4+  Wrist ulnar deviation    Wrist radial deviation    Wrist pronation    Wrist supination    (Blank rows = not tested)  HAND FUNCTION: Grip strength: Right: 19 lbs; Left: 69 lbs, Lateral pinch: Right: 5 lbs, Left: 21 lbs, and 3 point pinch: Right: 6 lbs, Left: 21 lbs  COORDINATION: 9 Hole Peg test: Right: 3 pegs placed in 2 min 21 sec  sec; Left: 22 sec  SENSATION: Light touch: Impaired   EDEMA: No visible edema  MUSCLE TONE: RUE: Hypertonic  COGNITION: Overall cognitive status: Impaired; expressive and receptive aphasia  VISION ASSESSMENT: Not tested; limited by time constraints d/t pt arriving late to eval; will assess as needed in upcoming sessions/within functional contexts   PERCEPTION: Not tested this date; will  assess in upcoming sessions  PRAXIS: Impaired: Motor planning  OBSERVATIONS: Pt appears happy to be back to OT to begin a 2nd round of rehab, and motivated to work on strengthening the R side for improving use for daily tasks.   TODAY'S TREATMENT:  DATE: 02/26/2023:  Therapeutic Exercise:  Pt. performed BUE strengthening in reciprocal motion on the SciFit for 8 min. with constant monitoring of the BUEs on work level 2. Pt.  performed the task in forward motion for 2 min. with no rest breaks required during the task. Pt. performed right gross gripping with a gross grip strengthener. Pt. worked on sustaining grip while grasping pegs and reaching at various heights. The gripper was set to 6.6# of grip strength resistance. Pt. did require a rest break at the end of the task. Pt. worked on Marketing executive in the R hand for lateral pinch using yellow, red, and green resistive clips. Pt. worked on placing the clips onto a horizontal dowel placed in front of her. Tactile and verbal cues were required for eliciting supination to place clips.   Neuromuscular re-education:  Facilitated proprioceptive input through the right hand at the tabletop surface to normalize tone, and prepare the right hand for resuming engagement in the task. Pt. worked on right hand grasp/prehension patterns, and translatory movements of the hand. Pt. worked on grasping the clips off of the tabletop surface, and positioning them in the right hand in preparation for placing them onto the horizontal dowel.      PATIENT EDUCATION: Education details:  Right hand [facility] skills.  Person educated: Patient and Parent Education method: Explanation Education comprehension: verbalized understanding  HOME EXERCISE PROGRAM: To be initiated in upcoming sessions  GOALS: Goals reviewed with patient? Yes  SHORT  TERM GOALS: Target date: 02/20/23  Pt will be indep to perform HEP for improving RUE strength and coordination for daily tasks. Baseline:  Eval: Not yet initiated Goal status: INITIAL  LONG TERM GOALS: Target date: 04/03/23  Pt will increase FOTO score to 65 or better to indicate improvement in self perceived functional use of the R arm with daily tasks. Baseline: Eval: 61; predicted 62 Goal status: INITIAL  2.  Pt will increase R grip strength by 5-7 lbs in order to hold and transport ADL supplies in R dominant hand with increased confidence. Baseline: Eval: R grip strength 19 lbs. Goal status: INITIAL  3.  Pt will increase R hand FMC/dexterity skills to enable pt to engage the R hand in manipulation of clothing fasteners.  Baseline: Eval: Assist needed with clothing fasteners; R 9 hole peg test: able to place 3 pegs in 2 min 21 sec. Goal status: INITIAL  4.  Pt will increase active R shoulder flexion and abd by 20 or more degrees in order to bimanually reach for items in closet at shoulder level.   Baseline: Eval: R shoulder flexion and abd 70*; pt uses L arm to reach for items at or above shoulder level. Goal status: INITIAL  ASSESSMENT:  CLINICAL IMPRESSION:  Pt. was able to tolerate UE strengthening using the SCIFit with no rest breaks required during the task. Pt. required one rest break at the end of the task. Pt. was able to grasp the clips from the tabletop surface, however presents with difficulty moving the clips, and repositioning them in her hand. Pt. was able to complete right hand grip strengthening with 6.6# of resistive force, however required assist to support the grip strengthener in order to keep it in an upright  vertical position grasping them. Pt. continues to benefit from additional skilled OT to target strengthening and coordination to better engage the RUE into daily tasks.   PERFORMANCE DEFICITS: in functional skills including ADLs, IADLs, coordination,  dexterity, ROM, strength, flexibility, Fine  motor control, Gross motor control, mobility, balance, body mechanics, decreased knowledge of use of DME, vision, and UE functional use, cognitive skills including thought and understand, and psychosocial skills including coping strategies, environmental adaptation, habits, interpersonal interactions, and routines and behaviors.   IMPAIRMENTS: are limiting patient from ADLs, IADLs, work, leisure, and social participation.   CO-MORBIDITIES: has co-morbidities such as global aphasia, R sided hemiparesis, HTN  that affects occupational performance. Patient will benefit from skilled OT to address above impairments and improve overall function.  MODIFICATION OR ASSISTANCE TO COMPLETE EVALUATION: No modification of tasks or assist necessary to complete an evaluation.  OT OCCUPATIONAL PROFILE AND HISTORY: Problem focused assessment: Including review of records relating to presenting problem.  CLINICAL DECISION MAKING: Moderate - several treatment options, min-mod task modification necessary  REHAB POTENTIAL: Good  EVALUATION COMPLEXITY: Moderate    PLAN:  OT FREQUENCY: 1-2x/week  OT DURATION: 12 weeks  PLANNED INTERVENTIONS: 97168 OT Re-evaluation, 97535 self care/ADL training, 40981 therapeutic exercise, 97530 therapeutic activity, 97112 neuromuscular re-education, 97140 manual therapy, 97116 gait training, 19147 moist heat, 97010 cryotherapy, passive range of motion, balance training, functional mobility training, visual/perceptual remediation/compensation, psychosocial skills training, energy conservation, coping strategies training, patient/family education, and DME and/or AE instructions  RECOMMENDED OTHER SERVICES: Pt has orders for SLP and PT  CONSULTED AND AGREED WITH PLAN OF CARE: Patient and family member/caregiver  PLAN FOR NEXT SESSION: See above  Olegario Messier, MS, OTR/L   02/26/23, 9:03 AM

## 2023-02-28 ENCOUNTER — Ambulatory Visit: Payer: Medicaid Other | Admitting: Physical Therapy

## 2023-03-05 ENCOUNTER — Ambulatory Visit: Payer: Medicaid Other | Attending: Physician Assistant | Admitting: Physical Therapy

## 2023-03-05 ENCOUNTER — Ambulatory Visit: Payer: Medicaid Other

## 2023-03-05 ENCOUNTER — Ambulatory Visit: Payer: Medicaid Other | Admitting: Occupational Therapy

## 2023-03-05 DIAGNOSIS — M6281 Muscle weakness (generalized): Secondary | ICD-10-CM

## 2023-03-05 DIAGNOSIS — R278 Other lack of coordination: Secondary | ICD-10-CM | POA: Insufficient documentation

## 2023-03-05 DIAGNOSIS — R482 Apraxia: Secondary | ICD-10-CM | POA: Insufficient documentation

## 2023-03-05 DIAGNOSIS — R2681 Unsteadiness on feet: Secondary | ICD-10-CM | POA: Insufficient documentation

## 2023-03-05 DIAGNOSIS — R4701 Aphasia: Secondary | ICD-10-CM | POA: Diagnosis present

## 2023-03-05 DIAGNOSIS — Z8673 Personal history of transient ischemic attack (TIA), and cerebral infarction without residual deficits: Secondary | ICD-10-CM | POA: Insufficient documentation

## 2023-03-05 DIAGNOSIS — R262 Difficulty in walking, not elsewhere classified: Secondary | ICD-10-CM | POA: Diagnosis present

## 2023-03-05 NOTE — Therapy (Signed)
OUTPATIENT OCCUPATIONAL THERAPY NEURO TREATMENT  Patient Name: Kourtlyn JAYLEA PLOURDE MRN: [id number] DOB:October 29, 1975, 48 y.o., female Today's Date: 03/05/2023  PCP: Dr. Burnadette Pop REFERRING PROVIDER: Joellyn Haff, PA  END OF SESSION:  OT End of Session - 03/05/23 0943     Visit Number 7    Number of Visits 24    Date for OT Re-Evaluation 04/03/23    OT Start Time 0845    OT Stop Time 0930    OT Time Calculation (min) 45 min    Activity Tolerance Patient tolerated treatment well    Behavior During Therapy WFL for tasks assessed/performed            Past Medical History:  Diagnosis Date   Diabetes mellitus without complication (HCC)    Hypertension    Stroke (HCC)    No past surgical history on file. Patient Active Problem List   Diagnosis Date Noted   CVA (cerebral vascular accident) (HCC) 07/01/2021   New onset type 2 diabetes mellitus (HCC) 07/01/2021   Dyslipidemia 07/01/2021   Hypertensive urgency 07/01/2021    ONSET DATE: 07/01/21  REFERRING DIAG: History of Cva, muscle weakness, lack of coordination  THERAPY DIAG:  Muscle weakness (generalized)  Other lack of coordination  Rationale for Evaluation and Treatment: Rehabilitation  SUBJECTIVE:  SUBJECTIVE STATEMENT:  Pt. reports doing well today Pt accompanied by:  Her Brother  PERTINENT HISTORY:  Pt known to this OT as pt participated in OT in this clinic after suffering a CVA in June of '23.   Per this writer's OT d/c note on 11/09/21: Pt was seen by OT for 18 sessions to address visual and RUE deficits following L PCA stroke. Pt has made strong functional gains, as noted by increased FOTO score from 36 at eval to 60 at discharge. Pt engages her RUE into self care tasks as a fair-good stabilizer, but pt relies on L non-dominant arm for tasks which require strength and dexterity. Pt has increased attention to R side, requiring min vc on occasion for R hand proprioception. HEP was reviewed this date with pt/mother  acknowledging understanding and benefits of consistent/daily participation to help pt continue to improve use of the RUE. OT encouraged new therapy referral in the new year if pt wishes to continue to focus on residual deficits post stroke. Discharge completed this date d/t visit limitations with insurance within this calendar year.   PRECAUTIONS: Fall  WEIGHT BEARING RESTRICTIONS: No  PAIN:  Are you having pain? No  FALLS: Has patient fallen in last 6 months? No  LIVING ENVIRONMENT: Lives with: lives with their family, Adult daughter 29 years old, and 2 sons 67 and 60 years old; mom lives 5 min away Lives in: house Stairs: 4 steps, no rail Has following equipment at home: None (walk in shower and standard toilet)  PLOF: Independent prior to CVA  PATIENT GOALS: improve speech, use of arm, improve leg strength  OBJECTIVE:  Note: Objective measures were completed at Evaluation unless otherwise noted.  HAND DOMINANCE: Right  ADLs: Overall ADLs: Uses RUE as a fair stabilizer to non-dominant L arm Transfers/ambulation related to ADLs:  Eating: cuts food with L hand cut only Grooming: uses L hand to manipulate grooming utensils; requires assist for bimanual tasks such as putting up hair UB Dressing: indep to don a sports bra; unable to manage clothing fasteners LB Dressing: indep to don LB clothing including slip on shoes and elastic waist paints; unable to manage clothing fasteners Toileting: indep with non-dominant  hand Bathing: modified indep with limited use of the R dominant hand Tub Shower transfers: indep (walk in shower) Equipment: none  IADLs: Shopping: pt now driving, indep Light housekeeping: shared between pt and children Meal Prep: pt cooks daily, minimal use of the R dominant arm other than to stabilize meal prep items Community mobility: indep without use of AD Medication management: indep with pill Retail buyer: indep on phone  Handwriting:  Increased time  MOBILITY STATUS:  R sided hemiparesis; able to amb community distances without AD  POSTURE COMMENTS:  R sided hemiparesis Sitting balance: Moves/returns truncal midpoint >2 inches in all planes  ACTIVITY TOLERANCE: Activity tolerance: WFL for tasks assessed  FUNCTIONAL OUTCOME MEASURES: FOTO: 61; predicted 62  UPPER EXTREMITY ROM:   Active ROM Right eval Left eval  Shoulder flexion 70   Shoulder abduction 70   Shoulder adduction    Shoulder extension    Shoulder internal rotation Hand to iliac crest    Shoulder external rotation [facility]   Elbow flexion    Elbow extension    Wrist flexion    Wrist extension    Wrist ulnar deviation    Wrist radial deviation    Wrist pronation    Wrist supination 72   (Blank rows = not tested)  UPPER EXTREMITY MMT:     MMT Right eval Left eval  Shoulder flexion 3- 4+  Shoulder abduction 3+ (within available range) 4+  Shoulder adduction    Shoulder extension    Shoulder internal rotation    Shoulder external rotation    Middle trapezius    Lower trapezius    Elbow flexion 4- 4+  Elbow extension 4+ 4+  Wrist flexion 4- 4+  Wrist extension 4- 4+  Wrist ulnar deviation    Wrist radial deviation    Wrist pronation    Wrist supination    (Blank rows = not tested)  HAND FUNCTION: Grip strength: Right: 19 lbs; Left: 69 lbs, Lateral pinch: Right: 5 lbs, Left: 21 lbs, and 3 point pinch: Right: 6 lbs, Left: 21 lbs  COORDINATION: 9 Hole Peg test: Right: 3 pegs placed in 2 min 21 sec  sec; Left: 22 sec  SENSATION: Light touch: Impaired   EDEMA: No visible edema  MUSCLE TONE: RUE: Hypertonic  COGNITION: Overall cognitive status: Impaired; expressive and receptive aphasia  VISION ASSESSMENT: Not tested; limited by time constraints d/t pt arriving late to eval; will assess as needed in upcoming sessions/within functional contexts   PERCEPTION: Not tested this date; will assess in upcoming sessions  PRAXIS:  Impaired: Motor planning  OBSERVATIONS: Pt appears happy to be back to OT to begin a 2nd round of rehab, and motivated to work on strengthening the R side for improving use for daily tasks.   TODAY'S TREATMENT:  DATE: 03/05/2023:  Therapeutic Exercise:  Pt. performed BUE strengthening in reciprocal motion on the SciFit for 8 min. with constant monitoring of the BUEs on work level 2. Pt.  performed the task in forward motion for 2 min. with no rest breaks required during the task. Pt. performed right gross gripping with a gross grip strengthener. Pt. worked on pinch strengthening in the R hand for lateral pinch using yellow, red, and green, blue, and black resistive clips. Pt. worked on placing the clips onto a horizontal dowel placed in front of her. Tactile and verbal cues were required for eliciting supination to place clips.   Neuromuscular re-education:  Pt. worked on grasping, and manipulating 1/2", 3/4", and 1" washers from a resitive magnetic dish using a 2pt. point grasp pattern. Pt. worked on reaching up, stabilizing, and sustaining shoulder elevation while placing the washer over a small precise target on vertical dowels positioned at various angles.   Pt. Worked moving sliding the 1"washers off the edge of an elevated surface with the 2nd digit to the thumb in in preparation for stacking them.      PATIENT EDUCATION: Education details:  Right hand [facility] skills.  Person educated: Patient and Parent Education method: Explanation Education comprehension: verbalized understanding  HOME EXERCISE PROGRAM: To be initiated in upcoming sessions  GOALS: Goals reviewed with patient? Yes  SHORT TERM GOALS: Target date: 02/20/23  Pt will be indep to perform HEP for improving RUE strength and coordination for daily tasks. Baseline:  Eval: Not yet initiated Goal  status: INITIAL  LONG TERM GOALS: Target date: 04/03/23  Pt will increase FOTO score to 65 or better to indicate improvement in self perceived functional use of the R arm with daily tasks. Baseline: Eval: 61; predicted 62 Goal status: INITIAL  2.  Pt will increase R grip strength by 5-7 lbs in order to hold and transport ADL supplies in R dominant hand with increased confidence. Baseline: Eval: R grip strength 19 lbs. Goal status: INITIAL  3.  Pt will increase R hand FMC/dexterity skills to enable pt to engage the R hand in manipulation of clothing fasteners.  Baseline: Eval: Assist needed with clothing fasteners; R 9 hole peg test: able to place 3 pegs in 2 min 21 sec. Goal status: INITIAL  4.  Pt will increase active R shoulder flexion and abd by 20 or more degrees in order to bimanually reach for items in closet at shoulder level.   Baseline: Eval: R shoulder flexion and abd 70*; pt uses L arm to reach for items at or above shoulder level. Goal status: INITIAL  ASSESSMENT:  CLINICAL IMPRESSION:  Pt. tolerated the UE UBE well today on level 4.0. Pt. was able to complete all levels of the resistive clips with the right hand. Pt. was able to complete 1", and 3/4" washers, however had difficulty with 1/2" washers. Pt. required intervals of alternating proprioception to normal tone, and reset the hand during the [facility] tasks. Pt. continues to benefit from additional skilled OT to target strengthening and coordination to better engage the RUE into daily tasks.   PERFORMANCE DEFICITS: in functional skills including ADLs, IADLs, coordination, dexterity, ROM, strength, flexibility, Fine motor control, Gross motor control, mobility, balance, body mechanics, decreased knowledge of use of DME, vision, and UE functional use, cognitive skills including thought and understand, and psychosocial skills including coping strategies, environmental adaptation, habits, interpersonal interactions, and routines and  behaviors.   IMPAIRMENTS: are limiting patient from ADLs, IADLs, work, leisure,  and social participation.   CO-MORBIDITIES: has co-morbidities such as global aphasia, R sided hemiparesis, HTN  that affects occupational performance. Patient will benefit from skilled OT to address above impairments and improve overall function.  MODIFICATION OR ASSISTANCE TO COMPLETE EVALUATION: No modification of tasks or assist necessary to complete an evaluation.  OT OCCUPATIONAL PROFILE AND HISTORY: Problem focused assessment: Including review of records relating to presenting problem.  CLINICAL DECISION MAKING: Moderate - several treatment options, min-mod task modification necessary  REHAB POTENTIAL: Good  EVALUATION COMPLEXITY: Moderate    PLAN:  OT FREQUENCY: 1-2x/week  OT DURATION: 12 weeks  PLANNED INTERVENTIONS: 97168 OT Re-evaluation, 97535 self care/ADL training, 32440 therapeutic exercise, 97530 therapeutic activity, 97112 neuromuscular re-education, 97140 manual therapy, 97116 gait training, 10272 moist heat, 97010 cryotherapy, passive range of motion, balance training, functional mobility training, visual/perceptual remediation/compensation, psychosocial skills training, energy conservation, coping strategies training, patient/family education, and DME and/or AE instructions  RECOMMENDED OTHER SERVICES: Pt has orders for SLP and PT  CONSULTED AND AGREED WITH PLAN OF CARE: Patient and family member/caregiver  PLAN FOR NEXT SESSION: See above  Olegario Messier, MS, OTR/L   03/05/23, 9:45 AM

## 2023-03-05 NOTE — Therapy (Signed)
OUTPATIENT PHYSICAL THERAPY NEURO TREATMENT   Patient Name: Cheyenne Martinez MRN: [id number] DOB:[birth year], 48 y.o., female Today's Date: 03/05/2023   PCP:  No PCP  REFERRING PROVIDER:  Ignacia Bayley PA  END OF SESSION:  PT End of Session - 03/05/23 0820     Visit Number 8    Number of Visits 24    Date for PT Re-Evaluation 03/20/23    Progress Note Due on Visit 10    PT Start Time 0930    PT Stop Time 1012    PT Time Calculation (min) 42 min    Equipment Utilized During Treatment Gait belt    Activity Tolerance Patient tolerated treatment well    Behavior During Therapy WFL for tasks assessed/performed                Past Medical History:  Diagnosis Date   Diabetes mellitus without complication (HCC)    Hypertension    Stroke (HCC)    No past surgical history on file. Patient Active Problem List   Diagnosis Date Noted   CVA (cerebral vascular accident) (HCC) 07/01/2021   New onset type 2 diabetes mellitus (HCC) 07/01/2021   Dyslipidemia 07/01/2021   Hypertensive urgency 07/01/2021    ONSET DATE:  07/01/2021  REFERRING MVHQ:I69.62 (ICD-10-CM) - Personal history of transient ischemic attack (TIA), and cerebral infarction without residual deficits  THERAPY DIAG:   Muscle weakness (generalized)   Unsteadiness on feet   Hemiplegia and hemiparesis following cerebral infarction affecting  Right dominant side (HCC)   Other abnormalities of gait and mobility   Acute ischemic L ACA stroke (HCC)  Rationale for Evaluation and Treatment: Rehabilitation  SUBJECTIVE:                                                                                                                                                                                             SUBJECTIVE STATEMENT:   Patient reports no pain today. Arrives with mother. States that they did not do much over the weekend. No other updates.   Still interested in knee cage.    Pt accompanied by:   Mother  PERTINENT HISTORY: Pt is a 48 y/o female who had 2 episodes of stroke and has received Physical therapy in the same OP clinic and has returned due to decline in functional mobility and  pain RLE with WB activities.  Pt with PMHx:   CVA x 2,   type 2 diabetes mellitus (HCC)   Hypertensive urgency   Dyslipidemia  PAIN:  Are you having pain? Yes: Pain location: 0/10 Pain description:   Aggravating factors: WB Relieving factors:  Rest  PRECAUTIONS: Fall  RED FLAGS: None   WEIGHT BEARING RESTRICTIONS: No  FALLS: Has patient fallen in last 6 months? No  LIVING ENVIRONMENT: Lives with: lives with their family Lives in: House/apartment Stairs: Yes: External: 4 steps; can reach both Has following equipment at home: None  PLOF: Independent with basic ADLs, Independent with household mobility without device, Independent with homemaking with ambulation, Independent with gait, Independent with transfers, and Leisure: driving  PATIENT GOALS: "I want to walk better and without falling and pain.   OBJECTIVE:  Note: Objective measures were completed at Evaluation unless otherwise noted.  DIAGNOSTIC FINDINGS: None for 2024 07/04/2021:  Evolving recent infarcts as seen on recent MRI. There is likely mild petechial hemorrhage associated with the left parieto-occipital infarct. No significant mass effect.   Plaque at the left greater than right ICA origins without hemodynamically significant stenosis. Marked stenosis at the right vertebral origin without apparent flow limitation.   Intracranial atherosclerosis involving anterior and posterior circulations. No proximal intracranial vessel occlusion.  COGNITION: Overall cognitive status: Within functional limits for tasks assessed   SENSATION: WFL   MUSCLE TONE: RLE: Moderate and Hypertonic  MUSCLE LENGTH: Hamstrings: Right 20 deg; Left WFL deg   POSTURE: rounded shoulders and R sided lean  LOWER EXTREMITY ROM:    PROM  WFL AROM WFL    LOWER EXTREMITY MMT:    MMT Right Eval Left Eval  Hip flexion 3/5 4-/5  Hip extension 3/5 4/5  Hip abduction 3/5 4/5  Hip adduction 4-/5 4/5  Hip internal rotation 3/5 4-/5  Hip external rotation 3/5 4-/5  Knee flexion 3+/5 4/5  Knee extension 3/5 4/5  Ankle dorsiflexion 3-/5 4/5  Ankle plantarflexion    Ankle inversion    Ankle eversion    (Blank rows = not tested)  BED MOBILITY:  Independent  TRANSFERS: Assistive device utilized: None  Sit to stand: Complete Independence Stand to sit: Complete Independence Chair to chair: Complete Independence Floor: SBA  RAMP:  Level of Assistance: CGA Assistive device utilized: None Ramp Comments: Pt needs CGA to ascend and Min to descend.   CURB:  Level of Assistance: CGA Assistive device utilized: None Curb Comments: Due to weakness, unsteady on feet and difficulty raising up concentrically.   STAIRS: Level of Assistance: CGA Stair Negotiation Technique: Step to Pattern Forwards with Single Rail on Left Number of Stairs: 4  Height of Stairs: 6 inch  Comments: Slow with heavy reliance on HR with one step a t a time.   GAIT: Gait pattern: step through pattern, decreased arm swing- Right, decreased step length- Right, decreased stance time- Left, decreased stride length, decreased ankle dorsiflexion- Right, and genu recurvatum- Right Distance walked: 23ft Assistive device utilized: None Level of assistance: SBA Comments: Impulsive, and needs VC for safety and proper technique  FUNCTIONAL TESTS:  8 reps in 30 seconds chair stand test Timed up and go (TUG): 13secs 10 meter walk test: 12 secs= 1.64m/sec Balance Test: Unable to Tandem stand, unable to SL stand, Rhomberg on NBOS x 20 secs  PATIENT SURVEYS:  LEFS 56/80 FOTO Complete FOTO next session.  TODAY'S TREATMENT:  DATE:  03/05/2023  Throughout session PT provided CGA with gait belt in place for safety unless otherwise  Gait through rehab gym without AD throughout session noted to have hip instability and RLE GR.    NMR:  Foot tap on 6 inch step x 15 bil. Progressed from 1 UE support to no UE support for last 10 reps bil   Standing on airex beam for AP control 2 x 30 sec. 1 foot on airex beam 2 x 30 sec bil  Min assist throughout to prevent lateral LOB and improve posture and pelvic alignment.   Lateral step Over half bolster x 5 bil.  Forwards single limb step over half bolster x 10 bil    Gait Training  Gait over hedge hogs with single foot tap 2 x 5 bil with min-mod assist to prevent lateral LOB with stance on the RLE.   Gait training with R AFO x 324ft. Significant improvement of heel contact and mild improvement in GR with AFO in place. Will consider AFO vs knee cage to reduce GR and reduce foot slap.      PATIENT EDUCATION: Education details: Safety, findings, rehab progression, significance of HEP.  Use of AFO vs knee cage.   Person educated: Patient and Parent Education method: Explanation Education comprehension: verbalized understanding, verbal cues required, and needs further education  HOME EXERCISE PROGRAM:  Access Code: [email or web address] URL: [email or web address] Date: 02/19/2023 Prepared by: Maureen Ralphs  Exercises - Standing Single Leg Hip Circles  - 3 x weekly - 3 sets - 10 reps - Standing Terminal Knee Extension with Resistance  - 3 x weekly - 3 sets - 10 reps   Access Code: [email or web address] URL: [email or web address] Date: 01/01/2023 Prepared by: Grier Rocher  Exercises - Seated Long Arc Quad  - 1 x daily - 7 x weekly - 3 sets - 10 reps - Seated Heel Slide  - 1 x daily - 7 x weekly - 3 sets - 10 reps - 2 hold - Sit to Stand  - 1 x daily - 7 x weekly - 3 sets - 10 reps - Seated Toe Raise  - 1 x daily - 7 x weekly - 3 sets - 15 reps - 2 hold -  Standing Alternating Knee Flexion  - 1 x daily - 7 x weekly - 3 sets - 10 reps - 2 hold   GOALS: Goals reviewed with patient? Yes  SHORT TERM GOALS: Target date: 01/12/2023  Patient will be independent in home exercise program to improve strength/mobility for better functional independence with ADLs.  Baseline: HEP administered 12/26/2022 Goal status: INITIAL   LONG TERM GOALS: Target date: 03/22/2022  Patient will complete >12 times sit to stand test in 30 seconds indicating an increased LE strength and improved balance.  Baseline: 8 reps in 30 secs Goal status: INITIAL  2.  Pt will improve LEFS score >64/80  Baseline: 56/80 Goal status: INITIAL  3.  Pt will improve Hamstring Length to <10 degrees to improve LE function and balance Baseline: 20 deg Hamstring lenght Goal status: INITIAL  4.  Pt will tandem stand for 30 secs without LOB.  Baseline: Unable Goal status: INITIAL  5.Pt will complete TUG in <10 secs to improve balance and safety with all functional mobility.  Baseline: 13secs Goal status: INITIAL  6.  Pt will complete 10 M gait speed in <9 secs to demonstrate safety with community level ambulation Baseline: 1.46m/sec Goal status: INITIAL  7.  Pt will improve FOTO score  by 5 points  Baseline: 64 Goal status: INITIAL   ASSESSMENT:  CLINICAL IMPRESSION:    Patient presents with good motivation for completion of physical therapy activities. Continued emphasis on improved knee control and posture. Min-mod assist from PT intermittently to prevent lateral and posterior LOB with single limb stance tasks on the RLE. Trialed AFO to improve knee control and reduce foot drop on the R side. Pt noted to have mild improvement in ankle and knee control with AFO in place. May benefit from more ridged AF to increase knee control and reduce GR.  Pt will continue to benefit from skilled physical therapy intervention to address impairments, improve QOL, and attain therapy goals.      OBJECTIVE IMPAIRMENTS: Abnormal gait, decreased activity tolerance, decreased balance, decreased coordination, decreased endurance, decreased mobility, difficulty walking, decreased ROM, decreased strength, and decreased safety awareness.   ACTIVITY LIMITATIONS: lifting, bending, stairs, and locomotion level  PARTICIPATION LIMITATIONS: driving, shopping, community activity, and stairs  PERSONAL FACTORS: Age, Time since onset of injury/illness/exacerbation, and previous benefit from the skilled PT interventions  are also affecting patient's functional outcome.   REHAB POTENTIAL: Good  CLINICAL DECISION MAKING: Stable/uncomplicated  EVALUATION COMPLEXITY: Low  PLAN:  PT FREQUENCY: 1-2x/week  PT DURATION: 12 weeks  PLANNED INTERVENTIONS: 97164- PT Re-evaluation, 97110-Therapeutic exercises, 97530- Therapeutic activity, [id number]- Neuromuscular re-education, 97535- Self Care, 10272- Manual therapy, [id number]- Gait training, [phone]- Orthotic Fit/training, Patient/Family education, Balance training, Stair training, Taping, Cryotherapy, and Moist heat  PLAN FOR NEXT SESSION:   Dynamic balance and strengthening for RLE. Pain management as indicated for R hip.  Follow-up on Swedish knee cage/AFO    Golden Pop PT ,DPT Physical Therapist- [place]  [facility]   8:26 AM 03/05/23

## 2023-03-05 NOTE — Therapy (Signed)
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 Patient is making progress toward LTGs and met 5/5 STGs this reporting period. See skilled intervention, clinical impressions, and goals below for details.    End of Session - 03/05/23 0847     Visit Number 10    Number of Visits 24    Date for SLP Re-Evaluation 03/20/23    Authorization Type [place] Prepaid [facility]    Authorization Time Period 27 combined PT/OT/ST visits until 01/30/24    Authorization - Visit Number 10    Authorization - Number of Visits 24    Progress Note Due on Visit 20    SLP Start Time 0800    SLP Stop Time  0845    SLP Time Calculation (min) 45 min             Past Medical History:  Diagnosis Date   Diabetes mellitus without complication (HCC)    Hypertension    Stroke (HCC)    No past surgical history on file. Patient Active Problem List   Diagnosis Date Noted   CVA (cerebral vascular accident) (HCC) 07/01/2021   New onset type 2 diabetes mellitus (HCC) 07/01/2021   Dyslipidemia 07/01/2021   Hypertensive urgency 07/01/2021    ONSET DATE: 07/01/2021   REFERRING DIAG: Cerebral infarction involving left posterior cerebral artery (HCC)  THERAPY DIAG:  Aphasia  Cerebral infarction involving left posterior cerebral artery (HCC)  Rationale for Evaluation and Treatment Rehabilitation  SUBJECTIVE:   SUBJECTIVE STATEMENT: Pt pleasant, motivated Pt accompanied by: self and family member; brother  PERTINENT HISTORY: Patient is a 48 year old female with history of  untreated hypertension, hyperlipidemia, and type II diabetes presented to [facility] ED on 07/01/2021 with report of sudden onset of memory difficulty and slurred speech.    PAIN:  Are you having pain? No  FALLS: Has patient fallen in last 6 months?  No  LIVING ENVIRONMENT: Lives with: lives with their family Lives in: House/apartment  PLOF:  Level of assistance: Independent with ADLs   PATIENT GOALS to improve communication  OBJECTIVE:   DIAGNOSTIC FINDINGS: 07/01/2021 MRI BRAIN W WO CONTRAST  IMPRESSION:  1. Evolving early subacute left PCA distribution infarct as above,  with additional scattered small volume ischemic changes within the  left frontotemporal and right parietal lobe as above. Associated  mild petechial blood products at the left occipital lobe without  frank hemorrhagic transformation or significant mass effect.  2. Underlying mild chronic microvascular ischemic disease with  chronic right cerebellar infarct.    07/04/2021 CTA HEAD NECK W WO CONTRAST  IMPRESSION:  Evolving recent infarcts as seen on recent MRI. There is likely mild  petechial hemorrhage associated with the left parieto-occipital  infarct. No significant mass effect.  Plaque at the left greater than right ICA origins without  hemodynamically significant stenosis. Marked stenosis at the right  vertebral origin without apparent flow limitation.  Intracranial atherosclerosis involving anterior and posterior  circulations. No proximal intracranial vessel occlusion.    Today's Treatment:  Speech Generating Device Use/Programming: Pt required mod verbal cueing to troubleshoot turning off and turning back on  device when display was frozen. Reviewed "Therapy" tab for HEP, pt able to return demo with extra time. Reviewed how to easily navigate between tabs as how to navigate between folders in "Talk" tab. Pt required min verbal cues to navigate to answer a variety of biographical questions to assist SLP  in filling out [employer] paperwork.    Aphasia Tx: During structured naming tasks (e.g. fill in, confrontation), pt provided fair-good approximations 60% of the time indep; improving to 90% with hierarchical cueing. Utilizing multimodal communication including SGD, writing, speech, and gestures, pt able to tell SLP what she did over the weekend as well as her opinion of recent [employer].  HEP: Explore SGD, HEP on SGD  PATIENT EDUCATION: Education details:as above Person educated: Patient Education method: Explanation, Demonstration, and Verbal cues Education comprehension: needs further education   GOALS: Goals reviewed with patient? No  SHORT TERM GOALS: Target date: 10 sessions  Pt will participate in further assessment of functional reading, writing, and repetition.  Baseline:  Goal status: MET  2.  Pt will follow 1-step directions with >50% accuracy with multimodal cues. Baseline:  Goal status: MET; new goal 03/05/23; Pt will follow 1-step directions with >80% accuracy with multimodal cues.  3.  Pt will repeat single words using a speech generating device in 50% of opportunities given frequent maximal verbal and maximal visual cues. Baseline:  Goal status: MET  4. With Moderate A-Maximal A, patient will perform structured naming tasks with fair-good approximations 50% of the time.   Baseline:  Goal Status: MET; With Moderate A-Maximal A, patient will perform structured naming tasks with fair-good approximations 70% of the time.   5. Using multimodal communication and assistance as needed, pt will answer basic biographical questions with 60% accuracy.   Baseline:    Goal Status: MET             6. Pt will repeat 2-3 word phrases with fair-good approximations >50% of the time with maximal cueing.    Baseline: new goal, 03/05/23   Goal Status: INITIAL     LONG TERM GOALS: Target date: 03/20/23  With Min A, patient/family will demonstrate understanding of the  following concepts: aphasia, spontaneous recovery, communication vs conversation, strengths/strategies to promote success in all communication settings. Baseline:  Goal status: PROGRESSING  2.  Using multimodal communication, pt will answer basic biographical questions in 8 out of 10 opportunities.  Baseline:  Goal status: PROGRESSING   ASSESSMENT:  CLINICAL IMPRESSION: Patient is a 48 y.o. female who was seen today for a speech-language treatment. 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 Pt's expressive communication is c/b severe word finding deficits, neologisms, phonemic and semantic paraphasias. Pt with islands of error free speech and well as automatic speech. Pt with concomitant apraxia of speech.This results in reduced express basic wants and needs. Pt also presents with receptive language deficits that impact her ability to answer basic yes/no questions and follow basic directions, although auditory comprehension is a relative strength. See details of tx session above.   OBJECTIVE IMPAIRMENTS include expressive language, receptive language, and aphasia.These impairments are limiting patient from return to work, managing medications, managing appointments, managing finances, household responsibilities, ADLs/IADLs, and effectively communicating at home and in community. Factors affecting potential to achieve goals and functional outcome are severity of impairments and time since stroke . Patient will benefit from skilled SLP services to address above impairments and improve overall function.  REHAB POTENTIAL: Good  PLAN: SLP FREQUENCY: 1-2x/week  SLP DURATION: 12 weeks  PLANNED INTERVENTIONS: Language facilitation, Cueing hierachy, Internal/external aids, Functional tasks, Multimodal communication approach, and SLP instruction and feedback   Clyde Canterbury, M.S., CCC-SLP Speech-Language Pathologist [facility] [phone] Arnette Felts)  [facility] MAIN [facility] SERVICES [address] [place], [place], [address] Phone: [phone]   Fax:  [phone]

## 2023-03-07 ENCOUNTER — Ambulatory Visit: Payer: Medicaid Other | Admitting: Physical Therapy

## 2023-03-12 ENCOUNTER — Ambulatory Visit: Payer: Medicaid Other

## 2023-03-12 ENCOUNTER — Ambulatory Visit: Payer: Medicaid Other | Admitting: Physical Therapy

## 2023-03-14 ENCOUNTER — Ambulatory Visit: Payer: Medicaid Other | Admitting: Physical Therapy

## 2023-03-19 ENCOUNTER — Ambulatory Visit: Payer: Medicaid Other

## 2023-03-19 DIAGNOSIS — R278 Other lack of coordination: Secondary | ICD-10-CM

## 2023-03-19 DIAGNOSIS — R4701 Aphasia: Secondary | ICD-10-CM

## 2023-03-19 DIAGNOSIS — Z8673 Personal history of transient ischemic attack (TIA), and cerebral infarction without residual deficits: Secondary | ICD-10-CM

## 2023-03-19 DIAGNOSIS — M6281 Muscle weakness (generalized): Secondary | ICD-10-CM | POA: Diagnosis not present

## 2023-03-19 DIAGNOSIS — R482 Apraxia: Secondary | ICD-10-CM

## 2023-03-19 NOTE — Therapy (Signed)
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 Patient is making progress toward LTGs and met 5/5 STGs this reporting period. See skilled intervention, clinical impressions, and goals below for details.    End of Session - 03/19/23 1204     Visit Number 11    Number of Visits 24    Date for SLP Re-Evaluation 03/20/23    Authorization Type [place] [employer] Prepaid [facility]    Authorization Time Period 27 combined PT/OT/ST visits until 01/30/24    Authorization - Visit Number 11    Authorization - Number of Visits 24    Progress Note Due on Visit 20    SLP Start Time 0845    SLP Stop Time  0930    SLP Time Calculation (min) 45 min    Activity Tolerance Patient tolerated treatment well             Past Medical History:  Diagnosis Date   Diabetes mellitus without complication (HCC)    Hypertension    Stroke (HCC)    No past surgical history on file. 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    PAIN:  Are you having pain? No  FALLS: Has patient fallen in last 6 months?  No  LIVING ENVIRONMENT: Lives with: lives with their family Lives in: House/apartment  PLOF:  Level of assistance: Independent with ADLs   PATIENT GOALS to improve communication  OBJECTIVE:   DIAGNOSTIC FINDINGS: 07/01/2021 MRI BRAIN W WO CONTRAST  IMPRESSION:  1. Evolving early subacute left PCA distribution infarct as above,  with additional scattered small volume ischemic changes within the  left frontotemporal and right parietal lobe as above. Associated  mild petechial blood products at the left occipital lobe without  frank hemorrhagic transformation or significant mass effect.  2. Underlying mild chronic microvascular ischemic disease with  chronic right cerebellar infarct.    07/04/2021 CTA HEAD NECK W WO CONTRAST  IMPRESSION:  Evolving recent infarcts as seen on recent MRI. There is likely mild  petechial hemorrhage associated with the left parieto-occipital  infarct. No significant mass effect.  Plaque at the left greater than right ICA origins without  hemodynamically significant stenosis. Marked stenosis at the right  vertebral origin without apparent flow limitation.  Intracranial atherosclerosis involving anterior and posterior  circulations. No proximal intracranial vessel occlusion.    Today's Treatment:  Speech Generating Device Use/Programming: Pt required mod verbal  cueing to troubleshoot switching from one tab to the next. Reviewed "Therapy" tab for HEP, pt able to return demo with extra time. Reviewed how to easily navigate between tabs as how to navigate between folders in "Talk" tab. SLP added additional items to reflect pt's hobbies.    Aphasia Tx:  During structured naming tasks (e.g. fill in, confrontation), pt provided fair-good approximations 50% of the time indep; improving to 90% with hierarchical cueing. Utilizing multimodal communication including SGD, writing, speech, and gestures, pt able to tell SLP what she did over the weekend. Pt completed functional repetition task for 3-4 word phrases with 75% accuracy indep; improving to 90% with max hierarchical cueing.  HEP: Explore SGD, HEP on SGD  PATIENT EDUCATION: Education details:as above Person educated: Patient Education method: Explanation, Demonstration, and Verbal cues Education comprehension: needs further education   GOALS: Goals reviewed with patient? No  SHORT TERM GOALS: Target date: 10 sessions  Pt will participate in further assessment of functional reading, writing, and repetition.  Baseline:  Goal status: MET  2.  Pt will follow 1-step directions with >50% accuracy with multimodal cues. Baseline:  Goal status: MET; new goal 03/05/23; Pt will follow 1-step directions with >80% accuracy with multimodal cues.  3.  Pt will repeat single words using a speech generating device in 50% of opportunities given frequent maximal verbal and maximal visual cues. Baseline:  Goal status: MET  4. With Moderate A-Maximal A, patient will perform structured naming tasks with fair-good approximations 50% of the time.   Baseline:  Goal Status: MET; With Moderate A-Maximal A, patient will perform structured naming tasks with fair-good approximations 70% of the time.   5. Using multimodal communication and assistance as needed, pt will answer basic biographical questions with 60% accuracy.   Baseline:    Goal Status: MET             6. Pt will repeat 2-3 word phrases with fair-good approximations >50% of the time with maximal cueing.    Baseline: new goal, 03/05/23   Goal Status: INITIAL     LONG TERM GOALS: Target date: 03/20/23  With Min A, patient/family will demonstrate  understanding of the following concepts: aphasia, spontaneous recovery, communication vs conversation, strengths/strategies to promote success in all communication settings. Baseline:  Goal status: PROGRESSING  2.  Using multimodal communication, pt will answer basic biographical questions in 8 out of 10 opportunities.  Baseline:  Goal status: PROGRESSING   ASSESSMENT:  CLINICAL IMPRESSION: Patient is a 48 y.o. female who was seen today for a speech-language treatment. 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 Pt's expressive communication is c/b severe word finding deficits, neologisms, phonemic and semantic paraphasias. Pt with islands of error free speech and well as automatic speech. Pt with concomitant apraxia of speech.This results in reduced express basic wants and needs. Pt also presents with receptive language deficits that impact her ability to answer basic yes/no questions and follow basic directions, although auditory comprehension is a relative strength. See details of tx session above.   OBJECTIVE IMPAIRMENTS include expressive language, receptive language, and aphasia.These impairments are limiting patient from return to work, managing medications, managing appointments, managing finances, household responsibilities, ADLs/IADLs, and effectively communicating at home and in community. Factors affecting potential to achieve goals and functional outcome are severity of impairments and time since stroke . Patient will benefit from skilled SLP services to address above impairments and improve overall function.  REHAB POTENTIAL: Good  PLAN: SLP FREQUENCY:  1-2x/week  SLP DURATION: 12 weeks  PLANNED INTERVENTIONS: Language facilitation, Cueing hierachy, Internal/external aids, Functional tasks, Multimodal communication approach, and SLP instruction and feedback   Clyde Canterbury, M.S., CCC-SLP Speech-Language Pathologist [facility] [phone] Arnette Felts)  [facility] [address] [place], [place], [address] Phone: [phone]   Fax:  [phone]

## 2023-03-19 NOTE — Therapy (Signed)
OUTPATIENT OCCUPATIONAL THERAPY NEURO TREATMENT  Patient Name: Cheyenne Martinez MRN: [id number] DOB:18-Sep-1975, 48 y.o., female Today's Date: 03/19/2023  PCP: Dr. Burnadette Pop REFERRING PROVIDER: Joellyn Haff, PA  END OF SESSION:  OT End of Session - 03/19/23 0812     Visit Number 8    Number of Visits 24    Date for OT Re-Evaluation 04/03/23    Authorization Time Period Reporting period beginning 01/09/23    Progress Note Due on Visit 10    OT Start Time 0805    OT Stop Time 0845    OT Time Calculation (min) 40 min    Activity Tolerance Patient tolerated treatment well    Behavior During Therapy WFL for tasks assessed/performed            Past Medical History:  Diagnosis Date   Diabetes mellitus without complication (HCC)    Hypertension    Stroke (HCC)    No past surgical history on file. Patient Active Problem List   Diagnosis Date Noted   CVA (cerebral vascular accident) (HCC) 07/01/2021   New onset type 2 diabetes mellitus (HCC) 07/01/2021   Dyslipidemia 07/01/2021   Hypertensive urgency 07/01/2021    ONSET DATE: 07/01/21  REFERRING DIAG: History of Cva, muscle weakness, lack of coordination  THERAPY DIAG:  Muscle weakness (generalized)  Other lack of coordination  History of CVA (cerebrovascular accident)  Rationale for Evaluation and Treatment: Rehabilitation  SUBJECTIVE:  SUBJECTIVE STATEMENT: Pt reports having a good weekend. Pt accompanied by:  mother  PERTINENT HISTORY:  Pt known to this OT as pt participated in OT in this clinic after suffering a CVA in June of '23.   Per this writer's OT d/c note on 11/09/21: Pt was seen by OT for 18 sessions to address visual and RUE deficits following L PCA stroke. Pt has made strong functional gains, as noted by increased FOTO score from 36 at eval to 60 at discharge. Pt engages her RUE into self care tasks as a fair-good stabilizer, but pt relies on L non-dominant arm for tasks which require strength and  dexterity. Pt has increased attention to R side, requiring min vc on occasion for R hand proprioception. HEP was reviewed this date with pt/mother acknowledging understanding and benefits of consistent/daily participation to help pt continue to improve use of the RUE. OT encouraged new therapy referral in the new year if pt wishes to continue to focus on residual deficits post stroke. Discharge completed this date d/t visit limitations with insurance within this calendar year.   PRECAUTIONS: Fall  WEIGHT BEARING RESTRICTIONS: No  PAIN:  Are you having pain? No  FALLS: Has patient fallen in last 6 months? No  LIVING ENVIRONMENT: Lives with: lives with their family, Adult daughter 4 years old, and 2 sons 51 and 40 years old; mom lives 5 min away Lives in: house Stairs: 4 steps, no rail Has following equipment at home: None (walk in shower and standard toilet)  PLOF: Independent prior to CVA  PATIENT GOALS: improve speech, use of arm, improve leg strength  OBJECTIVE:  Note: Objective measures were completed at Evaluation unless otherwise noted.  HAND DOMINANCE: Right  ADLs: Overall ADLs: Uses RUE as a fair stabilizer to non-dominant L arm Transfers/ambulation related to ADLs:  Eating: cuts food with L hand cut only Grooming: uses L hand to manipulate grooming utensils; requires assist for bimanual tasks such as putting up hair UB Dressing: indep to don a sports bra; unable to manage clothing fasteners  LB Dressing: indep to don LB clothing including slip on shoes and elastic waist paints; unable to manage clothing fasteners Toileting: indep with non-dominant hand Bathing: modified indep with limited use of the R dominant hand Tub Shower transfers: indep (walk in shower) Equipment: none  IADLs: Shopping: pt now driving, indep Light housekeeping: shared between pt and children Meal Prep: pt cooks daily, minimal use of the R dominant arm other than to stabilize meal prep  items Community mobility: indep without use of AD Medication management: indep with pill Retail buyer: indep on phone  Handwriting: Increased time  MOBILITY STATUS:  R sided hemiparesis; able to amb community distances without AD  POSTURE COMMENTS:  R sided hemiparesis Sitting balance: Moves/returns truncal midpoint >2 inches in all planes  ACTIVITY TOLERANCE: Activity tolerance: WFL for tasks assessed  FUNCTIONAL OUTCOME MEASURES: FOTO: 61; predicted 62  UPPER EXTREMITY ROM:   Active ROM Right eval Left eval  Shoulder flexion 70   Shoulder abduction 70   Shoulder adduction    Shoulder extension    Shoulder internal rotation Hand to iliac crest    Shoulder external rotation [facility]   Elbow flexion    Elbow extension    Wrist flexion    Wrist extension    Wrist ulnar deviation    Wrist radial deviation    Wrist pronation    Wrist supination 72   (Blank rows = not tested)  UPPER EXTREMITY MMT:     MMT Right eval Left eval  Shoulder flexion 3- 4+  Shoulder abduction 3+ (within available range) 4+  Shoulder adduction    Shoulder extension    Shoulder internal rotation    Shoulder external rotation    Middle trapezius    Lower trapezius    Elbow flexion 4- 4+  Elbow extension 4+ 4+  Wrist flexion 4- 4+  Wrist extension 4- 4+  Wrist ulnar deviation    Wrist radial deviation    Wrist pronation    Wrist supination    (Blank rows = not tested)  HAND FUNCTION: Grip strength: Right: 19 lbs; Left: 69 lbs, Lateral pinch: Right: 5 lbs, Left: 21 lbs, and 3 point pinch: Right: 6 lbs, Left: 21 lbs  COORDINATION: 9 Hole Peg test: Right: 3 pegs placed in 2 min 21 sec  sec; Left: 22 sec  SENSATION: Light touch: Impaired   EDEMA: No visible edema  MUSCLE TONE: RUE: Hypertonic  COGNITION: Overall cognitive status: Impaired; expressive and receptive aphasia  VISION ASSESSMENT: Not tested; limited by time constraints d/t pt arriving late to eval;  will assess as needed in upcoming sessions/within functional contexts   PERCEPTION: Not tested this date; will assess in upcoming sessions  PRAXIS: Impaired: Motor planning  OBSERVATIONS: Pt appears happy to be back to OT to begin a 2nd round of rehab, and motivated to work on strengthening the R side for improving use for daily tasks.   TODAY'S TREATMENT:  DATE: 03/19/2023: Therapeutic Exercise: -Pt. performed BUE strengthening in reciprocal motion on the SciFit for 8 min. with constant monitoring of the BUEs on work level 2.  Pt alternated between forward and reverse rotations at 2-3 min intervals.  -Grip strengthening with hand gripper at min resistance with 1 red band for 5 sets 10 reps; min vc for hand positioning -Dowel climb on table top to promote R shoulder flexion x5 reps; OT stabilized dowel during a stretch and hold at the top of each rep and throughout the climb -cane stretch for bilat shoulder ER to top of head mod vc, horiz abd mod vc, and abd (mod A for form).  Neuro re-ed: Facilitated R hand FMC/dexterity skills working with 20 [place] discs; pt moved discs from table top in/out of grid, rotating discs from red<>black sides.    PATIENT EDUCATION: Education details:  Right hand FMC skills and shoulder flexibility exercises  Person educated: Patient and Parent Education method: Explanation, demo, tactile and vc Education comprehension: verbalized understanding  HOME EXERCISE PROGRAM: To be initiated in upcoming sessions  GOALS: Goals reviewed with patient? Yes  SHORT TERM GOALS: Target date: 02/20/23  Pt will be indep to perform HEP for improving RUE strength and coordination for daily tasks. Baseline:  Eval: Not yet initiated Goal status: INITIAL  LONG TERM GOALS: Target date: 04/03/23  Pt will increase FOTO score to 65 or better to indicate  improvement in self perceived functional use of the R arm with daily tasks. Baseline: Eval: 61; predicted 62 Goal status: INITIAL  2.  Pt will increase R grip strength by 5-7 lbs in order to hold and transport ADL supplies in R dominant hand with increased confidence. Baseline: Eval: R grip strength 19 lbs. Goal status: INITIAL  3.  Pt will increase R hand FMC/dexterity skills to enable pt to engage the R hand in manipulation of clothing fasteners.  Baseline: Eval: Assist needed with clothing fasteners; R 9 hole peg test: able to place 3 pegs in 2 min 21 sec. Goal status: INITIAL  4.  Pt will increase active R shoulder flexion and abd by 20 or more degrees in order to bimanually reach for items in closet at shoulder level.   Baseline: Eval: R shoulder flexion and abd 70*; pt uses L arm to reach for items at or above shoulder level. Goal status: INITIAL  ASSESSMENT:  CLINICAL IMPRESSION:  Pt. tolerated Scifit well today on level 2.0. with ability to change between forward and reverse rotations with extra time but no physical assist, and also able to re-adjust R hand position on hand pedal as needed throughout 8 min.  Mod A to stabilize dowel on table top during climb to promote R shoulder flexion, and Mod A to perform abd with cane to achieve tolerable end range stretch, working to increase shoulder mobility for overhead reach into cabinets and closets at home.  Pt tolerated min resistance (1 red band) on hand gripper for 5 sets 10 reps.  Attempted 2 bands briefly (mod resistance), but not yet able to manage this.  Pt. continues to benefit from additional skilled OT to target strengthening and coordination to better engage the RUE into daily tasks.   PERFORMANCE DEFICITS: in functional skills including ADLs, IADLs, coordination, dexterity, ROM, strength, flexibility, Fine motor control, Gross motor control, mobility, balance, body mechanics, decreased knowledge of use of DME, vision, and UE  functional use, cognitive skills including thought and understand, and psychosocial skills including coping strategies, environmental adaptation, habits,  interpersonal interactions, and routines and behaviors.   IMPAIRMENTS: are limiting patient from ADLs, IADLs, work, leisure, and social participation.   CO-MORBIDITIES: has co-morbidities such as global aphasia, R sided hemiparesis, HTN  that affects occupational performance. Patient will benefit from skilled OT to address above impairments and improve overall function.  MODIFICATION OR ASSISTANCE TO COMPLETE EVALUATION: No modification of tasks or assist necessary to complete an evaluation.  OT OCCUPATIONAL PROFILE AND HISTORY: Problem focused assessment: Including review of records relating to presenting problem.  CLINICAL DECISION MAKING: Moderate - several treatment options, min-mod task modification necessary  REHAB POTENTIAL: Good  EVALUATION COMPLEXITY: Moderate    PLAN:  OT FREQUENCY: 1-2x/week  OT DURATION: 12 weeks  PLANNED INTERVENTIONS: 97168 OT Re-evaluation, 97535 self care/ADL training, 78295 therapeutic exercise, 97530 therapeutic activity, 97112 neuromuscular re-education, 97140 manual therapy, 97116 gait training, 62130 moist heat, 97010 cryotherapy, passive range of motion, balance training, functional mobility training, visual/perceptual remediation/compensation, psychosocial skills training, energy conservation, coping strategies training, patient/family education, and DME and/or AE instructions  RECOMMENDED OTHER SERVICES: Pt has orders for SLP and PT  CONSULTED AND AGREED WITH PLAN OF CARE: Patient and family member/caregiver  PLAN FOR NEXT SESSION: See above  Danelle Earthly, MS, OTR/L  03/19/23, 10:51 AM

## 2023-03-19 NOTE — Therapy (Signed)
OUTPATIENT PHYSICAL THERAPY NEURO TREATMENT   Patient Name: Cheyenne Martinez MRN: [id number] DOB:Oct 06, 1975, 48 y.o., female Today's Date: 03/19/2023   PCP:  No PCP  REFERRING PROVIDER:  Ignacia Bayley PA  END OF SESSION:  PT End of Session - 03/19/23 0932     Visit Number 9    Number of Visits 24    Date for PT Re-Evaluation 03/20/23    Progress Note Due on Visit 10    PT Start Time 0931    PT Stop Time 1015    PT Time Calculation (min) 44 min    Equipment Utilized During Treatment Gait belt    Activity Tolerance Patient tolerated treatment well    Behavior During Therapy WFL for tasks assessed/performed              Past Medical History:  Diagnosis Date   Diabetes mellitus without complication (HCC)    Hypertension    Stroke (HCC)    No past surgical history on file. Patient Active Problem List   Diagnosis Date Noted   CVA (cerebral vascular accident) (HCC) 07/01/2021   New onset type 2 diabetes mellitus (HCC) 07/01/2021   Dyslipidemia 07/01/2021   Hypertensive urgency 07/01/2021    ONSET DATE:  07/01/2021  REFERRING DIAG: J69.67 (ICD-10-CM) - Personal history of transient ischemic attack (TIA), and cerebral infarction without residual deficits  THERAPY DIAG:   Muscle weakness (generalized)   Unsteadiness on feet   Hemiplegia and hemiparesis following cerebral infarction affecting  Right dominant side (HCC)   Other abnormalities of gait and mobility   Acute ischemic L ACA stroke (HCC)  Rationale for Evaluation and Treatment: Rehabilitation  SUBJECTIVE:                                                                                                                                                                                             SUBJECTIVE STATEMENT:    Pt reports she had an "ok weekend" not really doing much.  Pt denies any falls or having any other complications upon arrival.   Still interested in knee cage.    Pt accompanied by:   Mother  PERTINENT HISTORY: Pt is a 48 y/o female who had 2 episodes of stroke and has received Physical therapy in the same OP clinic and has returned due to decline in functional mobility and  pain RLE with WB activities.  Pt with PMHx:   CVA x 2,   type 2 diabetes mellitus (HCC)   Hypertensive urgency   Dyslipidemia  PAIN:  Are you having pain? Yes: Pain location: 0/10 Pain description:   Aggravating factors: WB  Relieving factors: Rest  PRECAUTIONS: Fall  RED FLAGS: None   WEIGHT BEARING RESTRICTIONS: No  FALLS: Has patient fallen in last 6 months? No  LIVING ENVIRONMENT: Lives with: lives with their family Lives in: House/apartment Stairs: Yes: External: 4 steps; can reach both Has following equipment at home: None  PLOF: Independent with basic ADLs, Independent with household mobility without device, Independent with homemaking with ambulation, Independent with gait, Independent with transfers, and Leisure: driving  PATIENT GOALS: "I want to walk better and without falling and pain.   OBJECTIVE:  Note: Objective measures were completed at Evaluation unless otherwise noted.  DIAGNOSTIC FINDINGS: None for 2024 07/04/2021:  Evolving recent infarcts as seen on recent MRI. There is likely mild petechial hemorrhage associated with the left parieto-occipital infarct. No significant mass effect.   Plaque at the left greater than right ICA origins without hemodynamically significant stenosis. Marked stenosis at the right vertebral origin without apparent flow limitation.   Intracranial atherosclerosis involving anterior and posterior circulations. No proximal intracranial vessel occlusion.  COGNITION: Overall cognitive status: Within functional limits for tasks assessed   SENSATION: WFL   MUSCLE TONE: RLE: Moderate and Hypertonic  MUSCLE LENGTH: Hamstrings: Right 20 deg; Left WFL deg   POSTURE: rounded shoulders and R sided lean  LOWER EXTREMITY ROM:    PROM  WFL AROM WFL    LOWER EXTREMITY MMT:    MMT Right Eval Left Eval  Hip flexion 3/5 4-/5  Hip extension 3/5 4/5  Hip abduction 3/5 4/5  Hip adduction 4-/5 4/5  Hip internal rotation 3/5 4-/5  Hip external rotation 3/5 4-/5  Knee flexion 3+/5 4/5  Knee extension 3/5 4/5  Ankle dorsiflexion 3-/5 4/5  Ankle plantarflexion    Ankle inversion    Ankle eversion    (Blank rows = not tested)  BED MOBILITY:  Independent  TRANSFERS: Assistive device utilized: None  Sit to stand: Complete Independence Stand to sit: Complete Independence Chair to chair: Complete Independence Floor: SBA  RAMP:  Level of Assistance: CGA Assistive device utilized: None Ramp Comments: Pt needs CGA to ascend and Min to descend.   CURB:  Level of Assistance: CGA Assistive device utilized: None Curb Comments: Due to weakness, unsteady on feet and difficulty raising up concentrically.   STAIRS: Level of Assistance: CGA Stair Negotiation Technique: Step to Pattern Forwards with Single Rail on Left Number of Stairs: 4  Height of Stairs: 6 inch  Comments: Slow with heavy reliance on HR with one step a t a time.   GAIT: Gait pattern: step through pattern, decreased arm swing- Right, decreased step length- Right, decreased stance time- Left, decreased stride length, decreased ankle dorsiflexion- Right, and genu recurvatum- Right Distance walked: 60ft Assistive device utilized: None Level of assistance: SBA Comments: Impulsive, and needs VC for safety and proper technique  FUNCTIONAL TESTS:  8 reps in 30 seconds chair stand test Timed up and go (TUG): 13secs 10 meter walk test: 12 secs= 1.55m/sec Balance Test: Unable to Tandem stand, unable to SL stand, Rhomberg on NBOS x 20 secs  PATIENT SURVEYS:  LEFS 56/80 FOTO Complete FOTO next session.  TODAY'S TREATMENT: DATE: 03/19/2023  Throughout session PT provided CGA with gait belt in place for safety unless otherwise  Gait through rehab gym  without AD throughout session noted to have hip instability and RLE GR.    NMR:  Foot tap on 6 inch step x 15 bil. Pt able to perform without any UE support for the full  set of reps  Lateral walking on airex beam, x2 length with significant genu recurvatum of the R LE  Staggered stance with one foot on airex pad, one on firm ground, 30 sec bouts, x3 each LE leading  Standing on airex beam for AP control 2 x 30 sec.  Lateral step Over half airex beam x 5 bil.  Forwards single limb step over half bolster x 10 bil   Seated LAQ for increased quad activation necessary for improved knee stability during gait, 2x10 each LE  Gait Training:  Gait over hedge hogs with single foot tap 2 x 5 bil with min-mod assist to prevent lateral LOB with stance on the RLE.   Gait training with R AFO x 437ft. Significant improvement of heel contact and mild improvement in GR with AFO in place. Will consider AFO vs knee cage to reduce GR and reduce foot slap.      PATIENT EDUCATION: Education details: Safety, findings, rehab progression, significance of HEP.  Use of AFO vs knee cage.   Person educated: Patient and Parent Education method: Explanation Education comprehension: verbalized understanding, verbal cues required, and needs further education  HOME EXERCISE PROGRAM:  Access Code: [email or web address] URL: [email or web address] Date: 02/19/2023 Prepared by: Maureen Ralphs  Exercises - Standing Single Leg Hip Circles  - 3 x weekly - 3 sets - 10 reps - Standing Terminal Knee Extension with Resistance  - 3 x weekly - 3 sets - 10 reps   Access Code: [email or web address] URL: [email or web address] Date: 01/01/2023 Prepared by: Grier Rocher  Exercises - Seated Long Arc Quad  - 1 x daily - 7 x weekly - 3 sets - 10 reps - Seated Heel Slide  - 1 x daily - 7 x weekly - 3 sets - 10 reps - 2 hold - Sit to Stand  - 1 x daily - 7 x weekly - 3 sets - 10 reps - Seated Toe Raise  - 1 x  daily - 7 x weekly - 3 sets - 15 reps - 2 hold - Standing Alternating Knee Flexion  - 1 x daily - 7 x weekly - 3 sets - 10 reps - 2 hold   GOALS: Goals reviewed with patient? Yes  SHORT TERM GOALS: Target date: 01/12/2023  Patient will be independent in home exercise program to improve strength/mobility for better functional independence with ADLs.  Baseline: HEP administered 12/26/2022 Goal status: INITIAL   LONG TERM GOALS: Target date: 03/22/2022  Patient will complete >12 times sit to stand test in 30 seconds indicating an increased LE strength and improved balance.  Baseline: 8 reps in 30 secs Goal status: INITIAL  2.  Pt will improve LEFS score >64/80  Baseline: 56/80 Goal status: INITIAL  3.  Pt will improve Hamstring Length to <10 degrees to improve LE function and balance Baseline: 20 deg Hamstring lenght Goal status: INITIAL  4.  Pt will tandem stand for 30 secs without LOB.  Baseline: Unable Goal status: INITIAL  5.Pt will complete TUG in <10 secs to improve balance and safety with all functional mobility.  Baseline: 13secs Goal status: INITIAL  6.  Pt will complete 10 M gait speed in <9 secs to demonstrate safety with community level ambulation Baseline: 1.27m/sec Goal status: INITIAL  7.  Pt will improve FOTO score by 5 points  Baseline: 64 Goal status: INITIAL   ASSESSMENT:  CLINICAL IMPRESSION:  Pt performed well with the activities and made good progress with using less UE support for balance  tasks.  Pt still having significant genu recurvatum when ambulating with/without the AFO.  Pt would continue to benefit from quad strengthening in order to prevent this from occurring.  Pt continues to make good improvement towards goals and balance training.   Pt will continue to benefit from skilled therapy to address remaining deficits in order to improve overall QoL and return to PLOF.      OBJECTIVE IMPAIRMENTS: Abnormal gait, decreased activity tolerance,  decreased balance, decreased coordination, decreased endurance, decreased mobility, difficulty walking, decreased ROM, decreased strength, and decreased safety awareness.   ACTIVITY LIMITATIONS: lifting, bending, stairs, and locomotion level  PARTICIPATION LIMITATIONS: driving, shopping, community activity, and stairs  PERSONAL FACTORS: Age, Time since onset of injury/illness/exacerbation, and previous benefit from the skilled PT interventions  are also affecting patient's functional outcome.   REHAB POTENTIAL: Good  CLINICAL DECISION MAKING: Stable/uncomplicated  EVALUATION COMPLEXITY: Low  PLAN:  PT FREQUENCY: 1-2x/week  PT DURATION: 12 weeks  PLANNED INTERVENTIONS: 97164- PT Re-evaluation, 97110-Therapeutic exercises, 97530- Therapeutic activity, [id number]- Neuromuscular re-education, 97535- Self Care, 98119- Manual therapy, [id number]- Gait training, [phone]- Orthotic Fit/training, Patient/Family education, Balance training, Stair training, Taping, Cryotherapy, and Moist heat  PLAN FOR NEXT SESSION:   Dynamic balance and strengthening for RLE. Pain management as indicated for R hip.  Follow-up on Swedish knee cage/AFO    Nolon Bussing, PT, DPT Physical Therapist - [facility]  03/19/23, 11:00 AM

## 2023-03-21 ENCOUNTER — Ambulatory Visit: Payer: Medicaid Other | Admitting: Physical Therapy

## 2023-03-26 ENCOUNTER — Ambulatory Visit: Payer: Medicaid Other

## 2023-03-26 ENCOUNTER — Ambulatory Visit: Payer: Medicaid Other | Admitting: Physical Therapy

## 2023-03-26 DIAGNOSIS — R482 Apraxia: Secondary | ICD-10-CM

## 2023-03-26 DIAGNOSIS — R2681 Unsteadiness on feet: Secondary | ICD-10-CM

## 2023-03-26 DIAGNOSIS — M6281 Muscle weakness (generalized): Secondary | ICD-10-CM

## 2023-03-26 DIAGNOSIS — Z8673 Personal history of transient ischemic attack (TIA), and cerebral infarction without residual deficits: Secondary | ICD-10-CM

## 2023-03-26 DIAGNOSIS — R278 Other lack of coordination: Secondary | ICD-10-CM

## 2023-03-26 DIAGNOSIS — R262 Difficulty in walking, not elsewhere classified: Secondary | ICD-10-CM

## 2023-03-26 DIAGNOSIS — R4701 Aphasia: Secondary | ICD-10-CM

## 2023-03-26 NOTE — Therapy (Signed)
 OUTPATIENT OCCUPATIONAL THERAPY NEURO TREATMENT  Patient Name: Cheyenne Martinez MRN: [id number] DOB:[birth year], 48 y.o., female Today's Date: 03/26/2023  PCP: Dr. Burnadette Pop REFERRING PROVIDER: Joellyn Haff, PA  END OF SESSION:  OT End of Session - 03/26/23 0803     Visit Number 9    Number of Visits 24    Date for OT Re-Evaluation 04/03/23    Authorization Time Period Reporting period beginning 01/09/23    Progress Note Due on Visit 10    OT Start Time 0800    OT Stop Time 0845    OT Time Calculation (min) 45 min    Activity Tolerance Patient tolerated treatment well    Behavior During Therapy WFL for tasks assessed/performed            Past Medical History:  Diagnosis Date   Diabetes mellitus without complication (HCC)    Hypertension    Stroke (HCC)    No past surgical history on file. Patient Active Problem List   Diagnosis Date Noted   CVA (cerebral vascular accident) (HCC) 07/01/2021   New onset type 2 diabetes mellitus (HCC) 07/01/2021   Dyslipidemia 07/01/2021   Hypertensive urgency 07/01/2021    ONSET DATE: 07/01/21  REFERRING DIAG: History of Cva, muscle weakness, lack of coordination  THERAPY DIAG:  Muscle weakness (generalized)  Other lack of coordination  History of CVA (cerebrovascular accident)  Rationale for Evaluation and Treatment: Rehabilitation  SUBJECTIVE:  SUBJECTIVE STATEMENT: Pt reports doing well this morning. Pt accompanied by:  mother  PERTINENT HISTORY:  Pt known to this OT as pt participated in OT in this clinic after suffering a CVA in June of '23.   Per this writer's OT d/c note on 11/09/21: Pt was seen by OT for 18 sessions to address visual and RUE deficits following L PCA stroke. Pt has made strong functional gains, as noted by increased FOTO score from 36 at eval to 60 at discharge. Pt engages her RUE into self care tasks as a fair-good stabilizer, but pt relies on L non-dominant arm for tasks which require strength and  dexterity. Pt has increased attention to R side, requiring min vc on occasion for R hand proprioception. HEP was reviewed this date with pt/mother acknowledging understanding and benefits of consistent/daily participation to help pt continue to improve use of the RUE. OT encouraged new therapy referral in the new year if pt wishes to continue to focus on residual deficits post stroke. Discharge completed this date d/t visit limitations with insurance within this calendar year.   PRECAUTIONS: Fall  WEIGHT BEARING RESTRICTIONS: No  PAIN:  Are you having pain? No  FALLS: Has patient fallen in last 6 months? No  LIVING ENVIRONMENT: Lives with: lives with their family, Adult daughter 56 years old, and 2 sons 18 and 72 years old; mom lives 5 min away Lives in: house Stairs: 4 steps, no rail Has following equipment at home: None (walk in shower and standard toilet)  PLOF: Independent prior to CVA  PATIENT GOALS: improve speech, use of arm, improve leg strength  OBJECTIVE:  Note: Objective measures were completed at Evaluation unless otherwise noted.  HAND DOMINANCE: Right  ADLs: Overall ADLs: Uses RUE as a fair stabilizer to non-dominant L arm Transfers/ambulation related to ADLs:  Eating: cuts food with L hand cut only Grooming: uses L hand to manipulate grooming utensils; requires assist for bimanual tasks such as putting up hair UB Dressing: indep to don a sports bra; unable to manage clothing fasteners  LB Dressing: indep to don LB clothing including slip on shoes and elastic waist paints; unable to manage clothing fasteners Toileting: indep with non-dominant hand Bathing: modified indep with limited use of the R dominant hand Tub Shower transfers: indep (walk in shower) Equipment: none  IADLs: Shopping: pt now driving, indep Light housekeeping: shared between pt and children Meal Prep: pt cooks daily, minimal use of the R dominant arm other than to stabilize meal prep  items Community mobility: indep without use of AD Medication management: indep with pill Retail buyer: indep on phone  Handwriting: Increased time  MOBILITY STATUS:  R sided hemiparesis; able to amb community distances without AD  POSTURE COMMENTS:  R sided hemiparesis Sitting balance: Moves/returns truncal midpoint >2 inches in all planes  ACTIVITY TOLERANCE: Activity tolerance: WFL for tasks assessed  FUNCTIONAL OUTCOME MEASURES: FOTO: 61; predicted 62  UPPER EXTREMITY ROM:   Active ROM Right eval Left eval  Shoulder flexion 70   Shoulder abduction 70   Shoulder adduction    Shoulder extension    Shoulder internal rotation Hand to iliac crest    Shoulder external rotation [facility]   Elbow flexion    Elbow extension    Wrist flexion    Wrist extension    Wrist ulnar deviation    Wrist radial deviation    Wrist pronation    Wrist supination 72   (Blank rows = not tested)  UPPER EXTREMITY MMT:     MMT Right eval Left eval  Shoulder flexion 3- 4+  Shoulder abduction 3+ (within available range) 4+  Shoulder adduction    Shoulder extension    Shoulder internal rotation    Shoulder external rotation    Middle trapezius    Lower trapezius    Elbow flexion 4- 4+  Elbow extension 4+ 4+  Wrist flexion 4- 4+  Wrist extension 4- 4+  Wrist ulnar deviation    Wrist radial deviation    Wrist pronation    Wrist supination    (Blank rows = not tested)  HAND FUNCTION: Grip strength: Right: 19 lbs; Left: 69 lbs, Lateral pinch: Right: 5 lbs, Left: 21 lbs, and 3 point pinch: Right: 6 lbs, Left: 21 lbs  COORDINATION: 9 Hole Peg test: Right: 3 pegs placed in 2 min 21 sec  sec; Left: 22 sec  SENSATION: Light touch: Impaired   EDEMA: No visible edema  MUSCLE TONE: RUE: Hypertonic  COGNITION: Overall cognitive status: Impaired; expressive and receptive aphasia  VISION ASSESSMENT: Not tested; limited by time constraints d/t pt arriving late to eval;  will assess as needed in upcoming sessions/within functional contexts   PERCEPTION: Not tested this date; will assess in upcoming sessions  PRAXIS: Impaired: Motor planning  OBSERVATIONS: Pt appears happy to be back to OT to begin a 2nd round of rehab, and motivated to work on strengthening the R side for improving use for daily tasks.   TODAY'S TREATMENT:  DATE: 03/26/2023: Therapeutic Exercise: -Pt. performed BUE strengthening in reciprocal motion on the SciFit for 8 min. with constant monitoring of the BUEs on work level 4.  Pt completed 4 min on forward rotation, and 4 min on reverse rotation. -Grip strengthening with hand gripper at min resistance with 1 red band for 5 sets 10 reps; min visual demo for hand positioning -Issued pink theraputty and completed and reviewed handout with exercises as follows: strengthening and coordination exercises for R hand, including gross grasping, lateral/2 point/3 point pinching, digit abd/add, and digging coins out of putty.  Able to return demo with intermittent mod vc and demo for technique to improve quality of movement.  Encouraged completion daily.  Neuro re-ed: Facilitated R hand FMC/dexterity skills: Ball pegs placed into pegboard x15 pegs; pegboard placed on an incline to promote R shoulder forward flexion.  Lowered board to table top for remaining 5 pegs d/t increased dropping on an incline.  PATIENT EDUCATION: Education details:  HEP: pink theraputty  Person educated: Patient and Parent Education method: Explanation, demo, visual handout Education comprehension: verbalized understanding, further training needed  HOME EXERCISE PROGRAM: Pink theraputty  GOALS: Goals reviewed with patient? Yes  SHORT TERM GOALS: Target date: 02/20/23  Pt will be indep to perform HEP for improving RUE strength and coordination for  daily tasks. Baseline:  Eval: Not yet initiated Goal status: INITIAL  LONG TERM GOALS: Target date: 04/03/23  Pt will increase FOTO score to 65 or better to indicate improvement in self perceived functional use of the R arm with daily tasks. Baseline: Eval: 61; predicted 62 Goal status: INITIAL  2.  Pt will increase R grip strength by 5-7 lbs in order to hold and transport ADL supplies in R dominant hand with increased confidence. Baseline: Eval: R grip strength 19 lbs. Goal status: INITIAL  3.  Pt will increase R hand FMC/dexterity skills to enable pt to engage the R hand in manipulation of clothing fasteners.  Baseline: Eval: Assist needed with clothing fasteners; R 9 hole peg test: able to place 3 pegs in 2 min 21 sec. Goal status: INITIAL  4.  Pt will increase active R shoulder flexion and abd by 20 or more degrees in order to bimanually reach for items in closet at shoulder level.   Baseline: Eval: R shoulder flexion and abd 70*; pt uses L arm to reach for items at or above shoulder level. Goal status: INITIAL  ASSESSMENT:  CLINICAL IMPRESSION: Pt. tolerated increased resistance on Scifit today on level 4.0. with ability to change between forward and reverse rotations with extra time but no physical assist.  Able to re-adjust R hand position on hand pedal as needed throughout 8 min.  Able to place 10 ball pegs on pegboard with pegboard on incline wedge of 10 pegs, transitioning to table top level for last 5 pegs d/t increased dropping.  Issued pink theraputty for R hand strengthening and coordination exercise carryover at home.  Visual handout issued and reviewed; further training needed.  Pt. continues to benefit from additional skilled OT to target strengthening and coordination to better engage the RUE into daily tasks.   PERFORMANCE DEFICITS: in functional skills including ADLs, IADLs, coordination, dexterity, ROM, strength, flexibility, Fine motor control, Gross motor control,  mobility, balance, body mechanics, decreased knowledge of use of DME, vision, and UE functional use, cognitive skills including thought and understand, and psychosocial skills including coping strategies, environmental adaptation, habits, interpersonal interactions, and routines and behaviors.   IMPAIRMENTS: are  limiting patient from ADLs, IADLs, work, leisure, and social participation.   CO-MORBIDITIES: has co-morbidities such as global aphasia, R sided hemiparesis, HTN  that affects occupational performance. Patient will benefit from skilled OT to address above impairments and improve overall function.  MODIFICATION OR ASSISTANCE TO COMPLETE EVALUATION: No modification of tasks or assist necessary to complete an evaluation.  OT OCCUPATIONAL PROFILE AND HISTORY: Problem focused assessment: Including review of records relating to presenting problem.  CLINICAL DECISION MAKING: Moderate - several treatment options, min-mod task modification necessary  REHAB POTENTIAL: Good  EVALUATION COMPLEXITY: Moderate    PLAN:  OT FREQUENCY: 1-2x/week  OT DURATION: 12 weeks  PLANNED INTERVENTIONS: 97168 OT Re-evaluation, 97535 self care/ADL training, 16109 therapeutic exercise, 97530 therapeutic activity, 97112 neuromuscular re-education, 97140 manual therapy, 97116 gait training, 60454 moist heat, 97010 cryotherapy, passive range of motion, balance training, functional mobility training, visual/perceptual remediation/compensation, psychosocial skills training, energy conservation, coping strategies training, patient/family education, and DME and/or AE instructions  RECOMMENDED OTHER SERVICES: Pt has orders for SLP and PT  CONSULTED AND AGREED WITH PLAN OF CARE: Patient and family member/caregiver  PLAN FOR NEXT SESSION: See above  Danelle Earthly, MS, OTR/L  03/26/23, 9:59 AM

## 2023-03-26 NOTE — Therapy (Signed)
 OUTPATIENT SPEECH LANGUAGE PATHOLOGY APHASIA TREATMENT   Patient Name: Cheyenne Martinez MRN: [id number] DOB:26-Jun-1975, 48 y.o., female Today's Date: 03/26/2023  PCP: No PCP REFERRING PROVIDER: Ignacia Bayley, PA-C    End of Session - 03/26/23 1202     Visit Number 12    Number of Visits 24    Date for SLP Re-Evaluation 06/18/23    Authorization Type [place] Medicaid [facility]aid [facility]    Authorization Time Period 27 combined PT/OT/ST visits until 01/30/24    Authorization - Visit Number 13    Authorization - Number of Visits 24    Progress Note Due on Visit 20    SLP Start Time 0845    SLP Stop Time  0930    SLP Time Calculation (min) 45 min             Past Medical History:  Diagnosis Date   Diabetes mellitus without complication (HCC)    Hypertension    Stroke (HCC)    No past surgical history on file. 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    PAIN:  Are you having pain? No  FALLS: Has patient fallen in last 6 months?  No  LIVING ENVIRONMENT: Lives with: lives with their family Lives in: House/apartment  PLOF:  Level of assistance: Independent with ADLs   PATIENT GOALS to improve  communication  OBJECTIVE:   DIAGNOSTIC FINDINGS: 07/01/2021 MRI BRAIN W WO CONTRAST  IMPRESSION:  1. Evolving early subacute left PCA distribution infarct as above,  with additional scattered small volume ischemic changes within the  left frontotemporal and right parietal lobe as above. Associated  mild petechial blood products at the left occipital lobe without  frank hemorrhagic transformation or significant mass effect.  2. Underlying mild chronic microvascular ischemic disease with  chronic right cerebellar infarct.    07/04/2021 CTA HEAD NECK W WO CONTRAST  IMPRESSION:  Evolving recent infarcts as seen on recent MRI. There is likely mild  petechial hemorrhage associated with the left parieto-occipital  infarct. No significant mass effect.  Plaque at the left greater than right ICA origins without  hemodynamically significant stenosis. Marked stenosis at the right  vertebral origin without apparent flow limitation.  Intracranial atherosclerosis involving anterior and posterior  circulations. No proximal intracranial vessel occlusion.    Today's Treatment:    Re-assessment completed via Western Aphasia Battery- Revised  Spontaneous Speech                           Information content               8/10  Fluency                                 4/10                                          Comprehension     Yes/No questions                 57/60                                           Auditory Word Recognition  41/60                                Sequential Commands       50/80                              Repetition                             63/100                                        Naming    Object Naming                     29/60                                           Word Fluency                        2/20                                            Sentence Completion          7/10                                              Responsive Speech              4/10                                         Aphasia Quotient                  59.8/100         Pt's severity rating was MODERATE as indicated by an Aphasia Quotient of 59.8 (0-25=very severe, 26-50=severe, 51-75=moderate, 76 and above is mild). Marked improvement from initial evaluation. 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   SGD: pt received personal SGD and is eager to utilize.  Supportive counseling: Counseling/education provided re: progress to date and SLP POC.   HEP: Explore SGD, HEP on SGD  PATIENT EDUCATION: Education details:as above Person educated: Patient Education method: Explanation, Demonstration, and Verbal cues Education comprehension: needs further education   GOALS: Goals reviewed with patient? No  SHORT TERM GOALS: Target date: 10 sessions  Pt will participate in further assessment of functional reading, writing, and repetition.  Baseline:  Goal status: MET  2.  Pt will follow 1-step directions with >50% accuracy with multimodal cues. Baseline:  Goal status: MET; new goal 03/05/23; Pt will follow 1-step directions with >80% accuracy with multimodal cues.  3.  Pt will repeat single words using a speech generating device in 50% of opportunities given frequent maximal verbal and maximal visual cues. Baseline:  Goal status: MET  4. With Moderate A-Maximal A, patient will perform structured naming tasks with fair-good approximations 50% of the time.   Baseline:  Goal Status: MET; With Moderate A-Maximal A, patient will perform structured naming tasks with fair-good approximations 70% of the time.   5. Using multimodal communication and assistance as needed, pt will answer basic biographical questions with 60% accuracy.   Baseline:    Goal Status: MET             6. Pt will repeat 2-3 word phrases with fair-good approximations >50% of the time with  maximal cueing.    Baseline: new goal, 03/05/23   Goal Status: INITIAL     LONG TERM GOALS: Target date: 03/20/23  With Min A, patient/family will demonstrate understanding of the following concepts: aphasia, spontaneous recovery, communication vs conversation, strengths/strategies to promote success in all communication settings. Baseline:  Goal status: PROGRESSING  2.  Using multimodal communication, pt will answer basic biographical questions in 8 out of 10 opportunities.  Baseline:  Goal status: PROGRESSING   ASSESSMENT:  CLINICAL IMPRESSION: Patient is a 48 y.o. female who was seen today for a speech-language treatment. 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 Pt's expressive communication is c/b severe word finding deficits, neologisms, phonemic and semantic paraphasias. Pt with islands of error free speech and well as automatic speech. Pt with concomitant apraxia of speech.This results in reduced express basic wants and needs. Pt also presents with receptive language deficits that impact her ability to answer basic yes/no questions and follow basic directions, although auditory comprehension is a relative strength. See details of tx session above.   OBJECTIVE IMPAIRMENTS include expressive language, receptive language, and aphasia.These impairments are limiting patient from return to work, managing medications, managing appointments, managing finances, household responsibilities, ADLs/IADLs, and effectively communicating at home and in community. Factors affecting potential to achieve goals and functional outcome are severity of impairments and time since stroke . Patient will benefit from skilled SLP services to address above impairments and improve overall function.  REHAB POTENTIAL: Good  PLAN: SLP FREQUENCY: 1-2x/week  SLP DURATION: 12 weeks  PLANNED INTERVENTIONS: Language facilitation, Cueing hierachy, Internal/external aids,  Functional tasks, Multimodal communication approach, and SLP instruction and feedback   Clyde Canterbury, M.S., CCC-SLP Speech-Language Pathologist [facility] [phone] Arnette Felts)  [facility] [place] MAIN [facility] SERVICES [address] [place], [place], [address] Phone: [phone]   Fax:  [phone]

## 2023-03-26 NOTE — Therapy (Addendum)
 OUTPATIENT PHYSICAL THERAPY NEURO TREATMENT.  PHYSICAL THERAPY PROGRESS NOTE   Dates of reporting period  12/26/22   to   03/26/2023     Patient Name: Cheyenne Martinez MRN: [id number] DOB:09-04-1975, 48 y.o., female Today's Date: 03/26/2023   PCP:  No PCP  REFERRING PROVIDER:  Ignacia Bayley PA  END OF SESSION:  PT End of Session - 03/26/23 0810     Visit Number 10    Number of Visits 24    Date for PT Re-Evaluation 05/21/2023   Progress Note Due on Visit 10    PT Start Time 0930    PT Stop Time 1010    PT Time Calculation (min) 40 min    Equipment Utilized During Treatment Gait belt    Activity Tolerance Patient tolerated treatment well    Behavior During Therapy WFL for tasks assessed/performed              Past Medical History:  Diagnosis Date   Diabetes mellitus without complication (HCC)    Hypertension    Stroke (HCC)    No past surgical history on file. Patient Active Problem List   Diagnosis Date Noted   CVA (cerebral vascular accident) (HCC) 07/01/2021   New onset type 2 diabetes mellitus (HCC) 07/01/2021   Dyslipidemia 07/01/2021   Hypertensive urgency 07/01/2021    ONSET DATE:  07/01/2021  REFERRING DIAG: W09.81 (ICD-10-CM) - Personal history of transient ischemic attack (TIA), and cerebral infarction without residual deficits  THERAPY DIAG:   Muscle weakness (generalized)   Unsteadiness on feet   Hemiplegia and hemiparesis following cerebral infarction affecting  Right dominant side (HCC)   Other abnormalities of gait and mobility   Acute ischemic L ACA stroke (HCC)  Rationale for Evaluation and Treatment: Rehabilitation  SUBJECTIVE:                                                                                                                                                                                             SUBJECTIVE STATEMENT:    Pt reports she had a good weekend. States "yea, I'm good" when asked if pain is present. Unable to  elaborate.   No bracing present for this session    Pt accompanied by:  Mother  PERTINENT HISTORY: Pt is a 48 y/o female who had 2 episodes of stroke and has received Physical therapy in the same OP clinic and has returned due to decline in functional mobility and  pain RLE with WB activities.  Pt with PMHx:   CVA x 2,   type 2 diabetes mellitus (HCC)   Hypertensive urgency   Dyslipidemia  PAIN:  Are you having pain? Yes: Pain location: 0/10 Pain description:   Aggravating factors: WB Relieving factors: Rest  PRECAUTIONS: Fall  RED FLAGS: None   WEIGHT BEARING RESTRICTIONS: No  FALLS: Has patient fallen in last 6 months? No  LIVING ENVIRONMENT: Lives with: lives with their family Lives in: House/apartment Stairs: Yes: External: 4 steps; can reach both Has following equipment at home: None  PLOF: Independent with basic ADLs, Independent with household mobility without device, Independent with homemaking with ambulation, Independent with gait, Independent with transfers, and Leisure: driving  PATIENT GOALS: "I want to walk better and without falling and pain.   OBJECTIVE:  Note: Objective measures were completed at Evaluation unless otherwise noted.  DIAGNOSTIC FINDINGS: None for 2024 07/04/2021:  Evolving recent infarcts as seen on recent MRI. There is likely mild petechial hemorrhage associated with the left parieto-occipital infarct. No significant mass effect.   Plaque at the left greater than right ICA origins without hemodynamically significant stenosis. Marked stenosis at the right vertebral origin without apparent flow limitation.   Intracranial atherosclerosis involving anterior and posterior circulations. No proximal intracranial vessel occlusion.  COGNITION: Overall cognitive status: Within functional limits for tasks assessed   SENSATION: WFL   MUSCLE TONE: RLE: Moderate and Hypertonic  MUSCLE LENGTH: Hamstrings: Right 20 deg; Left WFL  deg   POSTURE: rounded shoulders and R sided lean  LOWER EXTREMITY ROM:    PROM WFL AROM WFL    LOWER EXTREMITY MMT:    MMT Right Eval Left Eval  Hip flexion 3/5 4-/5  Hip extension 3/5 4/5  Hip abduction 3/5 4/5  Hip adduction 4-/5 4/5  Hip internal rotation 3/5 4-/5  Hip external rotation 3/5 4-/5  Knee flexion 3+/5 4/5  Knee extension 3/5 4/5  Ankle dorsiflexion 3-/5 4/5  Ankle plantarflexion    Ankle inversion    Ankle eversion    (Blank rows = not tested)  BED MOBILITY:  Independent  TRANSFERS: Assistive device utilized: None  Sit to stand: Complete Independence Stand to sit: Complete Independence Chair to chair: Complete Independence Floor: SBA  RAMP:  Level of Assistance: CGA Assistive device utilized: None Ramp Comments: Pt needs CGA to ascend and Min to descend.   CURB:  Level of Assistance: CGA Assistive device utilized: None Curb Comments: Due to weakness, unsteady on feet and difficulty raising up concentrically.   STAIRS: Level of Assistance: CGA Stair Negotiation Technique: Step to Pattern Forwards with Single Rail on Left Number of Stairs: 4  Height of Stairs: 6 inch  Comments: Slow with heavy reliance on HR with one step a t a time.   GAIT: Gait pattern: step through pattern, decreased arm swing- Right, decreased step length- Right, decreased stance time- Left, decreased stride length, decreased ankle dorsiflexion- Right, and genu recurvatum- Right Distance walked: 49ft Assistive device utilized: None Level of assistance: SBA Comments: Impulsive, and needs VC for safety and proper technique  FUNCTIONAL TESTS:  8 reps in 30 seconds chair stand test Timed up and go (TUG): 13secs 10 meter walk test: 12 secs= 1.44m/sec Balance Test: Unable to Tandem stand, unable to SL stand, Rhomberg on NBOS x 20 secs  PATIENT SURVEYS:  LEFS 56/80 FOTO Complete FOTO next session.  TODAY'S TREATMENT: DATE: 03/26/2023  Seated therex:  Reciprocal  march 3#AW Reciprocal LAQ 3# AW  HS curl RTB x 12   30 sec sit<>stand: 10 reps  Tandem stance: 30 sec with RLE in front, 4 sec with LLE in front.  PT instructed pt in TUG: 11.59 sec (12.92 and 10.26) (average of 2 trials; >13.5 sec indicates increased fall risk) 6 Min Walk Test:  Instructed patient to ambulate as quickly and as safely as possible for 6 minutes using LRAD. Patient was allowed to take standing rest breaks without stopping the test, but if the patient required a sitting rest break the clock would be stopped and the test would be over.  Results: 875 feet  using no AD with supervision Assist. Increased GR on the RLE with fatigue  Results indicate that the patient has reduced endurance with ambulation compared to age matched norms.  Age Matched Norms: 23-69 yo M: 33 F: 55, 22-79 yo M: 53 F: 471, 53-89 yo M: 417 F: 392 MDC: 58.21 meters (190.98 feet) or 50 meters (ANPTA Core Set of Outcome Measures for Adults with Neurologic Conditions, 2018)   Throughout session PT provided CGA with gait belt in place for safety unless otherwise  Gait through rehab gym without AD throughout session noted to have hip instability and RLE GR.     PATIENT EDUCATION: Education details: Safety, findings, rehab progression, significance of HEP.  Use of AFO vs knee cage.   Person educated: Patient and Parent Education method: Explanation Education comprehension: verbalized understanding, verbal cues required, and needs further education  HOME EXERCISE PROGRAM:  Access Code: [email or web address] URL: [email or web address] Date: 02/19/2023 Prepared by: Maureen Ralphs  Exercises - Standing Single Leg Hip Circles  - 3 x weekly - 3 sets - 10 reps - Standing Terminal Knee Extension with Resistance  - 3 x weekly - 3 sets - 10 reps   Access Code: [email or web address] URL: [email or web address] Date: 01/01/2023 Prepared by: Grier Rocher  Exercises - Seated Long Arc Quad  - 1 x  daily - 7 x weekly - 3 sets - 10 reps - Seated Heel Slide  - 1 x daily - 7 x weekly - 3 sets - 10 reps - 2 hold - Sit to Stand  - 1 x daily - 7 x weekly - 3 sets - 10 reps - Seated Toe Raise  - 1 x daily - 7 x weekly - 3 sets - 15 reps - 2 hold - Standing Alternating Knee Flexion  - 1 x daily - 7 x weekly - 3 sets - 10 reps - 2 hold   GOALS: Goals reviewed with patient? Yes  SHORT TERM GOALS: Target date: 01/12/2023  Patient will be independent in home exercise program to improve strength/mobility for better functional independence with ADLs.  Baseline: HEP administered 12/26/2022 Goal status: INITIAL   LONG TERM GOALS: Target date: 05/21/2023  Patient will complete >12 times sit to stand test in 30 seconds indicating an increased LE strength and improved balance.  Baseline: 8 reps in 30 secs 2/24: 10 in 30 sec  Goal status: in progress   2.  Pt will improve LEFS score >64/80  Baseline: 56/80  2/24: 68/80 (question accuracy given speech deficits) Goal status: MET  3.  Pt will improve Hamstring Length to <10 degrees to improve LE function and balance Baseline: 20 deg Hamstring length 2/24: 18deg R HS length  Goal status:  in progress   4.  Pt will tandem stand for 30 secs without LOB.  Baseline: Unable 2/24: 30 sec with R foot in front . 4 sec with L foot in front Goal status: in progress   5.Pt will complete TUG in <10 secs to improve balance and safety with all functional mobility.  Baseline:  13secs 2/24: 11.59sec  Goal status: in progress   6.  Pt will complete 10 M gait speed in <9 secs to demonstrate safety with community level ambulation Baseline: 1.82m/sec 2/24: 0.98 m/s  Goal status: in progress   7.  Pt will improve FOTO score by 5 points  Baseline: 64 Goal status: INITIAL   ASSESSMENT:  CLINICAL IMPRESSION:  Pt performed well with the activities and made good progress with using less UE support for balance tasks. PT instructed pt in progress note  assessment and completion of standardized outcome measures. Pt demonstrates mild improvement in safety and balance with reduced TUG, improved balance with tandem stance, and improved function noted via, LEFS. Patient's condition has the potential to improve in response to therapy. Maximum improvement is yet to be obtained. The anticipated improvement is attainable and reasonable in a generally predictable time.   Pt will continue to benefit from skilled therapy to address remaining deficits in order to improve overall QoL and return to PLOF.      OBJECTIVE IMPAIRMENTS: Abnormal gait, decreased activity tolerance, decreased balance, decreased coordination, decreased endurance, decreased mobility, difficulty walking, decreased ROM, decreased strength, and decreased safety awareness.   ACTIVITY LIMITATIONS: lifting, bending, stairs, and locomotion level  PARTICIPATION LIMITATIONS: driving, shopping, community activity, and stairs  PERSONAL FACTORS: Age, Time since onset of injury/illness/exacerbation, and previous benefit from the skilled PT interventions  are also affecting patient's functional outcome.   REHAB POTENTIAL: Good  CLINICAL DECISION MAKING: Stable/uncomplicated  EVALUATION COMPLEXITY: Low  PLAN:  PT FREQUENCY: 1-2x/week  PT DURATION: 8 weeks  PLANNED INTERVENTIONS: 97164- PT Re-evaluation, 97110-Therapeutic exercises, 97530- Therapeutic activity, [id number]- Neuromuscular re-education, 97535- Self Care, 24401- Manual therapy, [id number]- Gait training, [id number]- Orthotic Fit/training, Patient/Family education, Balance training, Stair training, Taping, Cryotherapy, and Moist heat  PLAN FOR NEXT SESSION:   Dynamic balance and strengthening for RLE. Pain management as indicated for R hip.    Grier Rocher PT, DPT  Physical Therapist - [place]  [facility]  8:11 AM 03/26/23

## 2023-03-28 ENCOUNTER — Ambulatory Visit: Payer: Medicaid Other | Admitting: Physical Therapy

## 2023-04-02 ENCOUNTER — Ambulatory Visit: Payer: Medicaid Other | Admitting: Physical Therapy

## 2023-04-02 ENCOUNTER — Ambulatory Visit: Payer: Medicaid Other

## 2023-04-02 ENCOUNTER — Ambulatory Visit: Payer: Medicaid Other | Attending: Physician Assistant

## 2023-04-02 DIAGNOSIS — R482 Apraxia: Secondary | ICD-10-CM

## 2023-04-02 DIAGNOSIS — R2681 Unsteadiness on feet: Secondary | ICD-10-CM | POA: Insufficient documentation

## 2023-04-02 DIAGNOSIS — M6281 Muscle weakness (generalized): Secondary | ICD-10-CM | POA: Insufficient documentation

## 2023-04-02 DIAGNOSIS — Z8673 Personal history of transient ischemic attack (TIA), and cerebral infarction without residual deficits: Secondary | ICD-10-CM

## 2023-04-02 DIAGNOSIS — R4701 Aphasia: Secondary | ICD-10-CM | POA: Diagnosis present

## 2023-04-02 DIAGNOSIS — R41841 Cognitive communication deficit: Secondary | ICD-10-CM | POA: Diagnosis present

## 2023-04-02 DIAGNOSIS — R278 Other lack of coordination: Secondary | ICD-10-CM | POA: Diagnosis present

## 2023-04-02 DIAGNOSIS — R262 Difficulty in walking, not elsewhere classified: Secondary | ICD-10-CM

## 2023-04-02 NOTE — Therapy (Unsigned)
 OUTPATIENT OCCUPATIONAL THERAPY NEURO PROGRESS AND TREATMENT NOTE Reporting period beginning 01/09/23-04/02/2023  Patient Name: Cheyenne Martinez MRN: [id number] DOB:1975/04/02, 48 y.o., female Today's Date: 04/03/2023  PCP: Dr. Burnadette Pop REFERRING PROVIDER: Joellyn Haff, PA  END OF SESSION:  OT End of Session - 04/02/23 0808     Visit Number 10    Number of Visits 24    Date for OT Re-Evaluation 04/03/23    Authorization Type 2 more visits approved after 04/02/23    Authorization Time Period Reporting period beginning 01/09/23    Progress Note Due on Visit 10    OT Start Time 0801    OT Stop Time 0845    OT Time Calculation (min) 44 min    Activity Tolerance Patient tolerated treatment well    Behavior During Therapy [facility] for tasks assessed/performed            Past Medical History:  Diagnosis Date   Diabetes mellitus without complication (HCC)    Hypertension    Stroke (HCC)    No past surgical history on file. Patient Active Problem List   Diagnosis Date Noted   CVA (cerebral vascular accident) (HCC) 07/01/2021   New onset type 2 diabetes mellitus (HCC) 07/01/2021   Dyslipidemia 07/01/2021   Hypertensive urgency 07/01/2021   ONSET DATE: 07/01/21  REFERRING DIAG: History of Cva, muscle weakness, lack of coordination  THERAPY DIAG:  Muscle weakness (generalized)  Other lack of coordination  History of CVA (cerebrovascular accident)  Rationale for Evaluation and Treatment: Rehabilitation  SUBJECTIVE:  SUBJECTIVE STATEMENT: Pt acknowledged having 3 approved visits remaining per discipline and wanting to use those visits now rather than save any for the remainder of the year.  Pt accompanied by:  mother  PERTINENT HISTORY:  Pt known to this OT as pt participated in OT in this clinic after suffering a CVA in June of '23.   Per this writer's OT d/c note on 11/09/21: Pt was seen by OT for 18 sessions to address visual and RUE deficits following L PCA stroke. Pt has  made strong functional gains, as noted by increased FOTO score from 36 at eval to 60 at discharge. Pt engages her RUE into self care tasks as a fair-good stabilizer, but pt relies on L non-dominant arm for tasks which require strength and dexterity. Pt has increased attention to R side, requiring min vc on occasion for R hand proprioception. HEP was reviewed this date with pt/mother acknowledging understanding and benefits of consistent/daily participation to help pt continue to improve use of the RUE. OT encouraged new therapy referral in the new year if pt wishes to continue to focus on residual deficits post stroke. Discharge completed this date d/t visit limitations with insurance within this calendar year.   PRECAUTIONS: Fall  WEIGHT BEARING RESTRICTIONS: No  PAIN:  Are you having pain? No  FALLS: Has patient fallen in last 6 months? No  LIVING ENVIRONMENT: Lives with: lives with their family, Adult daughter 17 years old, and 2 sons 62 and 26 years old; mom lives 5 min away Lives in: house Stairs: 4 steps, no rail Has following equipment at home: None (walk in shower and standard toilet)  PLOF: Independent prior to CVA  PATIENT GOALS: improve speech, use of arm, improve leg strength  OBJECTIVE:  Note: Objective measures were completed at Evaluation unless otherwise noted.  HAND DOMINANCE: Right  ADLs: Overall ADLs: Uses RUE as a fair stabilizer to non-dominant L arm Transfers/ambulation related to ADLs:  Eating:  cuts food with L hand cut only Grooming: uses L hand to manipulate grooming utensils; requires assist for bimanual tasks such as putting up hair UB Dressing: indep to don a sports bra; unable to manage clothing fasteners LB Dressing: indep to don LB clothing including slip on shoes and elastic waist paints; unable to manage clothing fasteners Toileting: indep with non-dominant hand Bathing: modified indep with limited use of the R dominant hand Tub Shower transfers:  indep (walk in shower) Equipment: none  IADLs: Shopping: pt now driving, indep Light housekeeping: shared between pt and children Meal Prep: pt cooks daily, minimal use of the R dominant arm other than to stabilize meal prep items Community mobility: indep without use of AD Medication management: indep with pill Retail buyer: indep on phone  Handwriting: Increased time  MOBILITY STATUS:  R sided hemiparesis; able to amb community distances without AD  POSTURE COMMENTS:  R sided hemiparesis Sitting balance: Moves/returns truncal midpoint >2 inches in all planes  ACTIVITY TOLERANCE: Activity tolerance: WFL for tasks assessed  FUNCTIONAL OUTCOME MEASURES: FOTO: 61; predicted 62  UPPER EXTREMITY ROM:   Active ROM Left eval Right eval Right 04/02/23  Shoulder flexion  70 120  Shoulder abduction  70 135  Shoulder adduction     Shoulder extension     Shoulder internal rotation  Hand to iliac crest    Shoulder external rotation  [facility]   Elbow flexion     Elbow extension     Wrist flexion     Wrist extension     Wrist ulnar deviation     Wrist radial deviation     Wrist pronation     Wrist supination  72 75  (Blank rows = not tested)  UPPER EXTREMITY MMT:     MMT Right eval Right 04/02/23 Left eval  Shoulder flexion 3- 3+ (within available range) 4+  Shoulder abduction 3+ (within available range) 3+ (within available range) 4+  Shoulder adduction     Shoulder extension     Shoulder internal rotation     Shoulder external rotation     Middle trapezius     Lower trapezius     Elbow flexion 4- 4 4+  Elbow extension 4+ 4+ 4+  Wrist flexion 4- 4- 4+  Wrist extension 4- 4 4+  Wrist ulnar deviation     Wrist radial deviation     Wrist pronation     Wrist supination     (Blank rows = not tested)  HAND FUNCTION: Grip strength: Right: 19 lbs; Left: 69 lbs, Lateral pinch: Right: 5 lbs, Left: 21 lbs, and 3 point pinch: Right: 6 lbs, Left: 21 lbs 04/02/23:  Grip strength: Right: 20 lbs; Left: 74 lbs, Lateral pinch: Right: 8 lbs, Left: 21 lbs, and 3 point pinch: Right: 8 lbs, Left: 21 lbs  COORDINATION: 9 Hole Peg test: Right: 3 pegs placed in 2 min 21 sec  sec; Left: 22 sec 04/02/23: Right: all 9 pegs in 3 min 45 sec.    SENSATION: Light touch: Impaired   EDEMA: No visible edema  MUSCLE TONE: RUE: Hypertonic  COGNITION: Overall cognitive status: Impaired; expressive and receptive aphasia  VISION ASSESSMENT: Not tested; limited by time constraints d/t pt arriving late to eval; will assess as needed in upcoming sessions/within functional contexts   PERCEPTION: Not tested this date; will assess in upcoming sessions  PRAXIS: Impaired: Motor planning  OBSERVATIONS: Pt appears happy to be back to OT to begin a 2nd  round of rehab, and motivated to work on strengthening the R side for improving use for daily tasks.   TODAY'S TREATMENT:                                                                                                                              DATE: 04/03/23 Therapeutic Exercise: -Pt. performed BUE strengthening in reciprocal motion on the SciFit for 8 min. with constant monitoring of the BUEs on work level 4.  Pt completed 4 min on forward rotation, and 4 min on reverse rotation. -Dowel stretch for bilat shoulder flexibility, promoting overhead reach for ADLs: bilat shoulder flex, abd, ER behind head  Therapeutic Activity: Objective measures taken and goals updated and reviewed for progress note.  PATIENT EDUCATION: Education details:  progress towards goals Person educated: Patient and Parent Education method: Explanation, vc Education comprehension: verbalized understanding  HOME EXERCISE PROGRAM: Pink theraputty  GOALS: Goals reviewed with patient? Yes  SHORT TERM GOALS: Target date: 02/20/23  Pt will be indep to perform HEP for improving RUE strength and coordination for daily tasks. Baseline:  Eval: Not yet  initiated; 04/02/23: pt reports she uses her theraputty (uncertain how consistently) and routinely uses the R hand with daily tasks Goal status: in progress  LONG TERM GOALS: Target date: 04/03/23  Pt will increase FOTO score to 65 or better to indicate improvement in self perceived functional use of the R arm with daily tasks. Baseline: Eval: 61; predicted 62; 04/02/23: Will assess again at d/c Goal status: INITIAL  2.  Pt will increase R grip strength by 5-7 lbs in order to hold and transport ADL supplies in R dominant hand with increased confidence. Baseline: Eval: R grip strength 19 lbs; 04/02/23: R grip strength 20 lbs Goal status: improving/in progress  3.  Pt will increase R hand FMC/dexterity skills to enable pt to engage the R hand in manipulation of clothing fasteners.  Baseline: Eval: Assist needed with clothing fasteners; R 9 hole peg test: able to place 3 pegs in 2 min 21 sec; 04/02/23: R 9 hole peg test: Able to place all 9 pegs in 3 min 45 sec.  Goal status: Improving/in progress  4.  Pt will increase active R shoulder flexion and abd by 20 or more degrees in order to bimanually reach for items in closet at shoulder level.   Baseline: Eval: R shoulder flexion and abd 70*; pt uses L arm to reach for items at or above shoulder level; 04/02/23: R shoulder flexion 120*, abd 135* Goal status: achieved  ASSESSMENT: CLINICAL IMPRESSION: Pt seen for 10th visit progress update.  Objective measures show excellent gains with R shoulder flex/abd, R hand [facility] skills, and mild improvements with R grip and pinch strength.  Pt endorses engaging the R hand routinely into her daily tasks, and shows pride with her progress towards above noted goals.  Discharge planned in 2 more visits d/t insurance limitations.  With remaining visits, pt  will continue to benefit from focus on RUE strength, flexibility, and coordination skills in order to improve functional use of the RUE with daily tasks.    PERFORMANCE  DEFICITS: in functional skills including ADLs, IADLs, coordination, dexterity, ROM, strength, flexibility, Fine motor control, Gross motor control, mobility, balance, body mechanics, decreased knowledge of use of DME, vision, and UE functional use, cognitive skills including thought and understand, and psychosocial skills including coping strategies, environmental adaptation, habits, interpersonal interactions, and routines and behaviors.   IMPAIRMENTS: are limiting patient from ADLs, IADLs, work, leisure, and social participation.   CO-MORBIDITIES: has co-morbidities such as global aphasia, R sided hemiparesis, HTN  that affects occupational performance. Patient will benefit from skilled OT to address above impairments and improve overall function.  MODIFICATION OR ASSISTANCE TO COMPLETE EVALUATION: No modification of tasks or assist necessary to complete an evaluation.  OT OCCUPATIONAL PROFILE AND HISTORY: Problem focused assessment: Including review of records relating to presenting problem.  CLINICAL DECISION MAKING: Moderate - several treatment options, min-mod task modification necessary  REHAB POTENTIAL: Good  EVALUATION COMPLEXITY: Moderate    PLAN:  OT FREQUENCY: 1-2x/week  OT DURATION: 12 weeks  PLANNED INTERVENTIONS: 97168 OT Re-evaluation, 97535 self care/ADL training, 16109 therapeutic exercise, 97530 therapeutic activity, 97112 neuromuscular re-education, 97140 manual therapy, 97116 gait training, 60454 moist heat, 97010 cryotherapy, passive range of motion, balance training, functional mobility training, visual/perceptual remediation/compensation, psychosocial skills training, energy conservation, coping strategies training, patient/family education, and DME and/or AE instructions  RECOMMENDED OTHER SERVICES: Pt has orders for SLP and PT  CONSULTED AND AGREED WITH PLAN OF CARE: Patient and family member/caregiver  PLAN FOR NEXT SESSION: See above  Danelle Earthly, MS,  OTR/L  04/03/23, 2:33 PM

## 2023-04-02 NOTE — Therapy (Signed)
 OUTPATIENT SPEECH LANGUAGE PATHOLOGY APHASIA TREATMENT   Patient Name: Cheyenne Martinez MRN: [id number] DOB:09/05/1975, 48 y.o., female Today's Date: 04/02/2023  PCP: No PCP REFERRING PROVIDER: Ignacia Bayley, PA-C    End of Session - 04/02/23 1155     Visit Number 13    Number of Visits 24    Date for SLP Re-Evaluation 06/18/23    Authorization Type [place] Medicaid [employer]aid [facility]    Authorization Time Period 27 combined PT/OT/ST visits until 01/30/24    Authorization - Visit Number 13    Authorization - Number of Visits 24    Progress Note Due on Visit 20    SLP Start Time 0845    SLP Stop Time  0930    SLP Time Calculation (min) 45 min    Activity Tolerance Patient tolerated treatment well             Past Medical History:  Diagnosis Date   Diabetes mellitus without complication (HCC)    Hypertension    Stroke (HCC)    No past surgical history on file. 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    PAIN:  Are you having pain? No  FALLS: Has patient fallen in last 6 months?  No  LIVING ENVIRONMENT: Lives with: lives with their family Lives in: House/apartment  PLOF:  Level of assistance:  Independent with ADLs   PATIENT GOALS to improve communication  OBJECTIVE:  Today's Tx:             Aphasia Tx: During structured naming tasks (e.g. fill in, confrontation), pt provided fair-good approximations 50% of the time indep; improving to 90% with hierarchical cueing. Utilizing multimodal communication including writing, speech, and gestures, pt able to tell SLP what she did over the weekend. Pt and mother endorsed pt with improved wordfinding and overall communication since the beginning of ST with this Clinical research associate.  HEP: Explore SGD, HEP on SGD  PATIENT EDUCATION: Education details:as above Person educated: Patient Education method: Explanation, Demonstration, and Verbal cues Education comprehension: needs further education   GOALS: Goals reviewed with patient? No  SHORT TERM GOALS: Target date: 10 sessions  Pt will participate in further assessment of functional reading, writing, and repetition.  Baseline:  Goal status: MET  2.  Pt will follow 1-step directions with >50% accuracy with multimodal cues. Baseline:  Goal status: MET; new goal 03/05/23; Pt will follow 1-step directions with >80% accuracy with multimodal cues.  3.  Pt will repeat single words using a speech generating device in 50% of opportunities given frequent maximal verbal and maximal visual cues. Baseline:  Goal status: MET  4. With Moderate A-Maximal A, patient will perform structured naming tasks with fair-good approximations 50% of the time.   Baseline:  Goal Status: MET; With Moderate A-Maximal A,  patient will perform structured naming tasks with fair-good approximations 70% of the time.   5. Using multimodal communication and assistance as needed, pt will answer basic biographical questions with 60% accuracy.   Baseline:    Goal Status: MET             6. Pt will repeat 2-3 word phrases with fair-good approximations >50% of the time with maximal cueing.    Baseline: new goal, 03/05/23   Goal  Status: INITIAL     LONG TERM GOALS: Target date: 03/20/23  With Min A, patient/family will demonstrate understanding of the following concepts: aphasia, spontaneous recovery, communication vs conversation, strengths/strategies to promote success in all communication settings. Baseline:  Goal status: PROGRESSING  2.  Using multimodal communication, pt will answer basic biographical questions in 8 out of 10 opportunities.  Baseline:  Goal status: PROGRESSING   ASSESSMENT:  CLINICAL IMPRESSION: Patient is a 48 y.o. female who was seen today for a speech-language treatment. 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 Pt's expressive communication is c/b severe word finding deficits, neologisms, phonemic and semantic paraphasias. Pt with islands of error free speech and well as automatic speech. Pt with concomitant apraxia of speech.This results in reduced express basic wants and needs. Pt also presents with receptive language deficits that impact her ability to answer basic yes/no questions and follow basic directions, although auditory comprehension is a relative strength. See details of tx session above.   OBJECTIVE IMPAIRMENTS include expressive language, receptive language, and aphasia.These impairments are limiting patient from return to work, managing medications, managing appointments, managing finances, household responsibilities, ADLs/IADLs, and effectively communicating at home and in community. Factors affecting potential to achieve goals and functional outcome are severity of impairments and time since stroke . Patient will benefit from skilled SLP services to address above impairments and improve overall function.  REHAB POTENTIAL: Good  PLAN: SLP FREQUENCY: 1-2x/week  SLP DURATION: 12 weeks  PLANNED INTERVENTIONS: Language facilitation, Cueing hierachy, Internal/external aids, Functional tasks, Multimodal communication approach, and  SLP instruction and feedback   Clyde Canterbury, M.S., CCC-SLP Speech-Language Pathologist [facility] [phone] Arnette Felts)  [place] [facility] MAIN [facility] SERVICES [address] [place], [place], [address] Phone: [phone]   Fax:  [phone]

## 2023-04-02 NOTE — Therapy (Signed)
 OUTPATIENT PHYSICAL THERAPY NEURO TREATMENT.     Patient Name: Cheyenne Martinez MRN: [id number] DOB:07-26-1975, 48 y.o., female Today's Date: 04/02/2023   PCP:  No PCP  REFERRING PROVIDER:  Ignacia Bayley PA  END OF SESSION:  PT End of Session - 04/02/23 0932     Visit Number 11    Number of Visits 24    Date for PT Re-Evaluation 03/20/23    Progress Note Due on Visit 10    PT Start Time 0930    PT Stop Time 1010    PT Time Calculation (min) 40 min    Equipment Utilized During Treatment Gait belt    Activity Tolerance Patient tolerated treatment well    Behavior During Therapy WFL for tasks assessed/performed              Past Medical History:  Diagnosis Date   Diabetes mellitus without complication (HCC)    Hypertension    Stroke (HCC)    No past surgical history on file. Patient Active Problem List   Diagnosis Date Noted   CVA (cerebral vascular accident) (HCC) 07/01/2021   New onset type 2 diabetes mellitus (HCC) 07/01/2021   Dyslipidemia 07/01/2021   Hypertensive urgency 07/01/2021    ONSET DATE:  07/01/2021  REFERRING DIAG: W09.81 (ICD-10-CM) - Personal history of transient ischemic attack (TIA), and cerebral infarction without residual deficits  THERAPY DIAG:   Muscle weakness (generalized)   Unsteadiness on feet   Hemiplegia and hemiparesis following cerebral infarction affecting  Right dominant side (HCC)   Other abnormalities of gait and mobility   Acute ischemic L ACA stroke (HCC)  Rationale for Evaluation and Treatment: Rehabilitation  SUBJECTIVE:                                                                                                                                                                                             SUBJECTIVE STATEMENT:    Pt reports she had a good weekend. Reports no pain. Mother present. Questioning when brace will provided. Instructed pt and mother to [facility] and referring provided to verify  that order was sent.  Phone number to [facility] in [place] provided.   Pt accompanied by:  Mother  PERTINENT HISTORY: Pt is a 48 y/o female who had 2 episodes of stroke and has received Physical therapy in the same OP clinic and has returned due to decline in functional mobility and  pain RLE with WB activities.  Pt with PMHx:   CVA x 2,   type 2 diabetes mellitus (HCC)   Hypertensive urgency   Dyslipidemia  PAIN:  Are you having pain? Yes: Pain location: 0/10 Pain description:   Aggravating factors: WB Relieving factors: Rest  PRECAUTIONS: Fall  RED FLAGS: None   WEIGHT BEARING RESTRICTIONS: No  FALLS: Has patient fallen in last 6 months? No  LIVING ENVIRONMENT: Lives with: lives with their family Lives in: House/apartment Stairs: Yes: External: 4 steps; can reach both Has following equipment at home: None  PLOF: Independent with basic ADLs, Independent with household mobility without device, Independent with homemaking with ambulation, Independent with gait, Independent with transfers, and Leisure: driving  PATIENT GOALS: "I want to walk better and without falling and pain.   OBJECTIVE:  Note: Objective measures were completed at Evaluation unless otherwise noted.  DIAGNOSTIC FINDINGS: None for 2024 07/04/2021:  Evolving recent infarcts as seen on recent MRI. There is likely mild petechial hemorrhage associated with the left parieto-occipital infarct. No significant mass effect.   Plaque at the left greater than right ICA origins without hemodynamically significant stenosis. Marked stenosis at the right vertebral origin without apparent flow limitation.   Intracranial atherosclerosis involving anterior and posterior circulations. No proximal intracranial vessel occlusion.  COGNITION: Overall cognitive status: Within functional limits for tasks assessed   SENSATION: WFL   MUSCLE TONE: RLE: Moderate and Hypertonic  MUSCLE LENGTH: Hamstrings: Right 20  deg; Left WFL deg   POSTURE: rounded shoulders and R sided lean  LOWER EXTREMITY ROM:    PROM WFL AROM WFL    LOWER EXTREMITY MMT:    MMT Right Eval Left Eval  Hip flexion 3/5 4-/5  Hip extension 3/5 4/5  Hip abduction 3/5 4/5  Hip adduction 4-/5 4/5  Hip internal rotation 3/5 4-/5  Hip external rotation 3/5 4-/5  Knee flexion 3+/5 4/5  Knee extension 3/5 4/5  Ankle dorsiflexion 3-/5 4/5  Ankle plantarflexion    Ankle inversion    Ankle eversion    (Blank rows = not tested)  BED MOBILITY:  Independent  TRANSFERS: Assistive device utilized: None  Sit to stand: Complete Independence Stand to sit: Complete Independence Chair to chair: Complete Independence Floor: SBA  RAMP:  Level of Assistance: CGA Assistive device utilized: None Ramp Comments: Pt needs CGA to ascend and Min to descend.   CURB:  Level of Assistance: CGA Assistive device utilized: None Curb Comments: Due to weakness, unsteady on feet and difficulty raising up concentrically.   STAIRS: Level of Assistance: CGA Stair Negotiation Technique: Step to Pattern Forwards with Single Rail on Left Number of Stairs: 4  Height of Stairs: 6 inch  Comments: Slow with heavy reliance on HR with one step a t a time.   GAIT: Gait pattern: step through pattern, decreased arm swing- Right, decreased step length- Right, decreased stance time- Left, decreased stride length, decreased ankle dorsiflexion- Right, and genu recurvatum- Right Distance walked: 54ft Assistive device utilized: None Level of assistance: SBA Comments: Impulsive, and needs VC for safety and proper technique  FUNCTIONAL TESTS:  8 reps in 30 seconds chair stand test Timed up and go (TUG): 13secs 10 meter walk test: 12 secs= 1.94m/sec Balance Test: Unable to Tandem stand, unable to SL stand, Rhomberg on NBOS x 20 secs  PATIENT SURVEYS:  LEFS 56/80 FOTO Complete FOTO next session.  TODAY'S TREATMENT: DATE: 04/02/2023  Sit<>stand 2 x  12  Foot tap on 6 inch step x 10 bil 3#aw Weighted gait training 2 x 323ft 3 # AW  Step up/down 2 step with reciprocal pattern and reverse descent. X 5 3#AW Lateral foot tap  on 6 inch step x 10 bil  Forward/reverse 63ft, with 3# AW x 2, and no resistance x 2  Side stepping at rail x 4 bil with min assist to block excessive pelvic motion     Throughout session PT provided CGA with gait belt in place for safety unless otherwise noted. Min cues for improved HS activation and reduced hip hike with limb advancement on the RLE as able.      PATIENT EDUCATION: Education details: Safety, findings, rehab progression, significance of HEP.  Use of AFO vs knee cage.   Person educated: Patient and Parent Education method: Explanation Education comprehension: verbalized understanding, verbal cues required, and needs further education  HOME EXERCISE PROGRAM:  Access Code: [email or web address] URL: [email or web address] Date: 02/19/2023 Prepared by: Maureen Ralphs  Exercises - Standing Single Leg Hip Circles  - 3 x weekly - 3 sets - 10 reps - Standing Terminal Knee Extension with Resistance  - 3 x weekly - 3 sets - 10 reps   Access Code: [email or web address] URL: [email or web address] Date: 01/01/2023 Prepared by: Grier Rocher  Exercises - Seated Long Arc Quad  - 1 x daily - 7 x weekly - 3 sets - 10 reps - Seated Heel Slide  - 1 x daily - 7 x weekly - 3 sets - 10 reps - 2 hold - Sit to Stand  - 1 x daily - 7 x weekly - 3 sets - 10 reps - Seated Toe Raise  - 1 x daily - 7 x weekly - 3 sets - 15 reps - 2 hold - Standing Alternating Knee Flexion  - 1 x daily - 7 x weekly - 3 sets - 10 reps - 2 hold   GOALS: Goals reviewed with patient? Yes  SHORT TERM GOALS: Target date: 01/12/2023  Patient will be independent in home exercise program to improve strength/mobility for better functional independence with ADLs.  Baseline: HEP administered 12/26/2022 Goal status:  INITIAL   LONG TERM GOALS: Target date: 03/22/2022  Patient will complete >12 times sit to stand test in 30 seconds indicating an increased LE strength and improved balance.  Baseline: 8 reps in 30 secs 2/24: 10 in 30 sec  Goal status: in progress   2.  Pt will improve LEFS score >64/80  Baseline: 56/80  2/24: 68/80 (question accuracy given speech deficits) Goal status: MET  3.  Pt will improve Hamstring Length to <10 degrees to improve LE function and balance Baseline: 20 deg Hamstring length 2/24: 18deg R HS length  Goal status:  in progress   4.  Pt will tandem stand for 30 secs without LOB.  Baseline: Unable 2/24: 30 sec with R foot in front . 4 sec with L foot in front Goal status: in progress   5.Pt will complete TUG in <10 secs to improve balance and safety with all functional mobility.  Baseline: 13secs 2/24: 11.59sec  Goal status: in progress   6.  Pt will complete 10 M gait speed in <9 secs to demonstrate safety with community level ambulation Baseline: 1.59m/sec 2/24: 0.98 m/s  Goal status: in progress   7.  Pt will improve FOTO score by 5 points  Baseline: 64 Goal status: INITIAL   ASSESSMENT:  CLINICAL IMPRESSION:  Pt performed well with PT treatment and interventions. PT treatment focused on functional movement patterns and improved awareness of RLE with dynamic movement. Pt reports that she really enjoys walking with ankle weights on BLE. Improved HS activation with stair management reverse gait on  this day. Instructed pt's mother to reach out to referring provider and [facility] to obtain knee brace to reduce knee GR.  Pt will continue to benefit from skilled therapy to address remaining deficits in order to improve overall QoL and return to PLOF.      OBJECTIVE IMPAIRMENTS: Abnormal gait, decreased activity tolerance, decreased balance, decreased coordination, decreased endurance, decreased mobility, difficulty walking, decreased ROM, decreased  strength, and decreased safety awareness.   ACTIVITY LIMITATIONS: lifting, bending, stairs, and locomotion level  PARTICIPATION LIMITATIONS: driving, shopping, community activity, and stairs  PERSONAL FACTORS: Age, Time since onset of injury/illness/exacerbation, and previous benefit from the skilled PT interventions  are also affecting patient's functional outcome.   REHAB POTENTIAL: Good  CLINICAL DECISION MAKING: Stable/uncomplicated  EVALUATION COMPLEXITY: Low  PLAN:  PT FREQUENCY: 1-2x/week  PT DURATION: 12 weeks  PLANNED INTERVENTIONS: 97164- PT Re-evaluation, 97110-Therapeutic exercises, 97530- Therapeutic activity, [id number]- Neuromuscular re-education, 97535- Self Care, [id number]- Manual therapy, [id number]- Gait training, [id number]- Orthotic Fit/training, Patient/Family education, Balance training, Stair training, Taping, Cryotherapy, and Moist heat  PLAN FOR NEXT SESSION:   Continue Dynamic balance and strengthening for RLE. Pain management as indicated for R hip.    Grier Rocher PT, DPT  Physical Therapist - [place]  [facility]  9:33 AM 04/02/23

## 2023-04-04 ENCOUNTER — Ambulatory Visit: Payer: Medicaid Other | Admitting: Physical Therapy

## 2023-04-09 ENCOUNTER — Ambulatory Visit: Payer: Medicaid Other | Admitting: Physical Therapy

## 2023-04-09 ENCOUNTER — Ambulatory Visit: Payer: Medicaid Other

## 2023-04-09 DIAGNOSIS — R4701 Aphasia: Secondary | ICD-10-CM

## 2023-04-09 DIAGNOSIS — R2681 Unsteadiness on feet: Secondary | ICD-10-CM

## 2023-04-09 DIAGNOSIS — M6281 Muscle weakness (generalized): Secondary | ICD-10-CM

## 2023-04-09 DIAGNOSIS — R482 Apraxia: Secondary | ICD-10-CM

## 2023-04-09 DIAGNOSIS — Z8673 Personal history of transient ischemic attack (TIA), and cerebral infarction without residual deficits: Secondary | ICD-10-CM | POA: Diagnosis not present

## 2023-04-09 DIAGNOSIS — R278 Other lack of coordination: Secondary | ICD-10-CM

## 2023-04-09 DIAGNOSIS — R262 Difficulty in walking, not elsewhere classified: Secondary | ICD-10-CM

## 2023-04-09 DIAGNOSIS — R41841 Cognitive communication deficit: Secondary | ICD-10-CM

## 2023-04-09 NOTE — Therapy (Signed)
 OUTPATIENT PHYSICAL THERAPY NEURO TREATMENT.     Patient Name: Cheyenne Martinez MRN: [id number] DOB:Jul 26, 1975, 48 y.o., female Today's Date: 04/09/2023   PCP:  No PCP  REFERRING PROVIDER:  Ignacia Bayley PA  END OF SESSION:  PT End of Session - 04/09/23 0931     Visit Number 12    Number of Visits 24    Date for PT Re-Evaluation 05/21/23    Progress Note Due on Visit 10    PT Start Time 0930    PT Stop Time 1010    PT Time Calculation (min) 40 min    Equipment Utilized During Treatment Gait belt    Activity Tolerance Patient tolerated treatment well    Behavior During Therapy WFL for tasks assessed/performed              Past Medical History:  Diagnosis Date   Diabetes mellitus without complication (HCC)    Hypertension    Stroke (HCC)    No past surgical history on file. Patient Active Problem List   Diagnosis Date Noted   CVA (cerebral vascular accident) (HCC) 07/01/2021   New onset type 2 diabetes mellitus (HCC) 07/01/2021   Dyslipidemia 07/01/2021   Hypertensive urgency 07/01/2021    ONSET DATE:  07/01/2021  REFERRING DIAG: A21.30 (ICD-10-CM) - Personal history of transient ischemic attack (TIA), and cerebral infarction without residual deficits  THERAPY DIAG:   Muscle weakness (generalized)   Unsteadiness on feet   Hemiplegia and hemiparesis following cerebral infarction affecting  Right dominant side (HCC)   Other abnormalities of gait and mobility   Acute ischemic L ACA stroke (HCC)  Rationale for Evaluation and Treatment: Rehabilitation  SUBJECTIVE:                                                                                                                                                                                             SUBJECTIVE STATEMENT:    Pt reports she had a good weekend. Order for [facility] knee cage has been submitted.  PT explained benefits of knee protection from brace, as pt has hx of not wearing AFO.   Pt accompanied  by:  Mother  PERTINENT HISTORY: Pt is a 48 y/o female who had 2 episodes of stroke and has received Physical therapy in the same OP clinic and has returned due to decline in functional mobility and  pain RLE with WB activities.  Pt with PMHx:   CVA x 2,   type 2 diabetes mellitus (HCC)   Hypertensive urgency   Dyslipidemia  PAIN:  Are you having pain? Yes: Pain location: 0/10 Pain description:  Aggravating factors: WB Relieving factors: Rest  PRECAUTIONS: Fall  RED FLAGS: None   WEIGHT BEARING RESTRICTIONS: No  FALLS: Has patient fallen in last 6 months? No  LIVING ENVIRONMENT: Lives with: lives with their family Lives in: House/apartment Stairs: Yes: External: 4 steps; can reach both Has following equipment at home: None  PLOF: Independent with basic ADLs, Independent with household mobility without device, Independent with homemaking with ambulation, Independent with gait, Independent with transfers, and Leisure: driving  PATIENT GOALS: "I want to walk better and without falling and pain.   OBJECTIVE:  Note: Objective measures were completed at Evaluation unless otherwise noted.  DIAGNOSTIC FINDINGS: None for 2024 07/04/2021:  Evolving recent infarcts as seen on recent MRI. There is likely mild petechial hemorrhage associated with the left parieto-occipital infarct. No significant mass effect.   Plaque at the left greater than right ICA origins without hemodynamically significant stenosis. Marked stenosis at the right vertebral origin without apparent flow limitation.   Intracranial atherosclerosis involving anterior and posterior circulations. No proximal intracranial vessel occlusion.  COGNITION: Overall cognitive status: Within functional limits for tasks assessed   SENSATION: WFL   MUSCLE TONE: RLE: Moderate and Hypertonic  MUSCLE LENGTH: Hamstrings: Right 20 deg; Left WFL deg   POSTURE: rounded shoulders and R sided lean  LOWER EXTREMITY ROM:     PROM WFL AROM WFL    LOWER EXTREMITY MMT:    MMT Right Eval Left Eval  Hip flexion 3/5 4-/5  Hip extension 3/5 4/5  Hip abduction 3/5 4/5  Hip adduction 4-/5 4/5  Hip internal rotation 3/5 4-/5  Hip external rotation 3/5 4-/5  Knee flexion 3+/5 4/5  Knee extension 3/5 4/5  Ankle dorsiflexion 3-/5 4/5  Ankle plantarflexion    Ankle inversion    Ankle eversion    (Blank rows = not tested)  BED MOBILITY:  Independent  TRANSFERS: Assistive device utilized: None  Sit to stand: Complete Independence Stand to sit: Complete Independence Chair to chair: Complete Independence Floor: SBA  RAMP:  Level of Assistance: CGA Assistive device utilized: None Ramp Comments: Pt needs CGA to ascend and Min to descend.   CURB:  Level of Assistance: CGA Assistive device utilized: None Curb Comments: Due to weakness, unsteady on feet and difficulty raising up concentrically.   STAIRS: Level of Assistance: CGA Stair Negotiation Technique: Step to Pattern Forwards with Single Rail on Left Number of Stairs: 4  Height of Stairs: 6 inch  Comments: Slow with heavy reliance on HR with one step a t a time.   GAIT: Gait pattern: step through pattern, decreased arm swing- Right, decreased step length- Right, decreased stance time- Left, decreased stride length, decreased ankle dorsiflexion- Right, and genu recurvatum- Right Distance walked: 71ft Assistive device utilized: None Level of assistance: SBA Comments: Impulsive, and needs VC for safety and proper technique  FUNCTIONAL TESTS:  8 reps in 30 seconds chair stand test Timed up and go (TUG): 13secs 10 meter walk test: 12 secs= 1.27m/sec Balance Test: Unable to Tandem stand, unable to SL stand, Rhomberg on NBOS x 20 secs  PATIENT SURVEYS:  LEFS 56/80 FOTO Complete FOTO next session.  TODAY'S TREATMENT: DATE: 04/09/2023  30 sec sit to stand. X 2 bouts  13, 14  Tandem stance: 20sec and 28sec with R foot in front, 10 and 20 sec  with LLE in front.   Gait without AD and no resistance 156ft x 45 sec. Additional weighted gait 3#AW x 2 bouts at 47 sec and  46 sec   Foot tap on 6 inch step x 12 bil with 3# AW.  Lateral foot tap on 6 inch step x 12 bil 3# AW.  Forward/reverse gait with 3 # AW, 50ft x 4 cues for improved knee flexion with reverse gait.  Standing squat with BUE support x 12   30 sec sit to stand x 12   Throughout session PT provided CGA with for safety unless otherwise noted. Min cues for improved HS activation and reduced hip hike with limb advancement on the RLE as able.      PATIENT EDUCATION: Education details: Safety, findings, rehab progression, significance of HEP.  Use of AFO vs knee cage.   Person educated: Patient and Parent Education method: Explanation Education comprehension: verbalized understanding, verbal cues required, and needs further education  HOME EXERCISE PROGRAM:  Access Code: [email or web address] URL: [email or web address] Date: 02/19/2023 Prepared by: Maureen Ralphs  Exercises - Standing Single Leg Hip Circles  - 3 x weekly - 3 sets - 10 reps - Standing Terminal Knee Extension with Resistance  - 3 x weekly - 3 sets - 10 reps   Access Code: [email or web address] URL: [email or web address] Date: 01/01/2023 Prepared by: Grier Rocher  Exercises - Seated Long Arc Quad  - 1 x daily - 7 x weekly - 3 sets - 10 reps - Seated Heel Slide  - 1 x daily - 7 x weekly - 3 sets - 10 reps - 2 hold - Sit to Stand  - 1 x daily - 7 x weekly - 3 sets - 10 reps - Seated Toe Raise  - 1 x daily - 7 x weekly - 3 sets - 15 reps - 2 hold - Standing Alternating Knee Flexion  - 1 x daily - 7 x weekly - 3 sets - 10 reps - 2 hold   GOALS: Goals reviewed with patient? Yes  SHORT TERM GOALS: Target date: 01/12/2023  Patient will be independent in home exercise program to improve strength/mobility for better functional independence with ADLs.  Baseline: HEP administered  12/26/2022 Goal status: INITIAL   LONG TERM GOALS: Target date: 05/21/2023  Patient will complete >12 times sit to stand test in 30 seconds indicating an increased LE strength and improved balance.  Baseline: 8 reps in 30 secs 2/24: 10 in 30 sec  Goal status: in progress   2.  Pt will improve LEFS score >64/80  Baseline: 56/80  2/24: 68/80 (question accuracy given speech deficits) Goal status: MET  3.  Pt will improve Hamstring Length to <10 degrees to improve LE function and balance Baseline: 20 deg Hamstring length 2/24: 18deg R HS length  Goal status:  in progress   4.  Pt will tandem stand for 30 secs without LOB.  Baseline: Unable 2/24: 30 sec with R foot in front . 4 sec with L foot in front Goal status: in progress   5.Pt will complete TUG in <10 secs to improve balance and safety with all functional mobility.  Baseline: 13secs 2/24: 11.59sec  Goal status: in progress   6.  Pt will complete 10 M gait speed in <9 secs to demonstrate safety with community level ambulation Baseline: 1.4m/sec 2/24: 0.98 m/s  Goal status: in progress   7.  Pt will improve FOTO score by 5 points  Baseline: 64 Goal status: INITIAL   ASSESSMENT:  CLINICAL IMPRESSION:  Pt performed well with PT treatment and interventions. PT treatment focused on functional movement patterns and improved awareness of RLE with dynamic  movement. Pt tolerated weighted gait and variable movement training well with increased cues for attention to the RLE and knee flexion in swing and stance.  Continued education for benefits of [place] cage over AFO to protect knee, and has pt has history of non compliance with AFO.  Pt will continue to benefit from skilled therapy to address remaining deficits in order to improve overall QoL and return to PLOF.      OBJECTIVE IMPAIRMENTS: Abnormal gait, decreased activity tolerance, decreased balance, decreased coordination, decreased endurance, decreased mobility,  difficulty walking, decreased ROM, decreased strength, and decreased safety awareness.   ACTIVITY LIMITATIONS: lifting, bending, stairs, and locomotion level  PARTICIPATION LIMITATIONS: driving, shopping, community activity, and stairs  PERSONAL FACTORS: Age, Time since onset of injury/illness/exacerbation, and previous benefit from the skilled PT interventions  are also affecting patient's functional outcome.   REHAB POTENTIAL: Good  CLINICAL DECISION MAKING: Stable/uncomplicated  EVALUATION COMPLEXITY: Low  PLAN:  PT FREQUENCY: 1-2x/week  PT DURATION: 12 weeks  PLANNED INTERVENTIONS: 97164- PT Re-evaluation, 97110-Therapeutic exercises, 97530- Therapeutic activity, [id number]- Neuromuscular re-education, 97535- Self Care, [id number]- Manual therapy, [id number]- Gait training, [phone]- Orthotic Fit/training, Patient/Family education, Balance training, Stair training, Taping, Cryotherapy, and Moist heat  PLAN FOR NEXT SESSION:   Continue Dynamic balance and strengthening for RLE. Pain management as indicated for R hip.    Grier Rocher PT, DPT  Physical Therapist - [facility]  10:18 AM 04/09/23

## 2023-04-09 NOTE — Therapy (Signed)
 OUTPATIENT SPEECH LANGUAGE PATHOLOGY APHASIA TREATMENT   Patient Name: Cheyenne Martinez MRN: [id number] DOB:28-Oct-1975, 48 y.o., female Today's Date: 04/09/2023  PCP: No PCP REFERRING PROVIDER: Ignacia Bayley, PA-C    End of Session - 04/09/23 1157     Visit Number 14    Number of Visits 24    Date for SLP Re-Evaluation 06/18/23    Authorization Type [place] Medicaid Prepaid [facility]    Authorization Time Period 27 combined PT/OT/ST visits until 01/30/24    Authorization - Visit Number 14    Authorization - Number of Visits 24    Progress Note Due on Visit 20    SLP Start Time 0945    SLP Stop Time  0930    SLP Time Calculation (min) 1425 min    Activity Tolerance Patient tolerated treatment well             Past Medical History:  Diagnosis Date   Diabetes mellitus without complication (HCC)    Hypertension    Stroke (HCC)    No past surgical history on file. 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    PAIN:  Are you having pain? No  FALLS: Has patient fallen in last 6 months?  No  LIVING ENVIRONMENT: Lives with: lives with their family Lives in: House/apartment  PLOF:  Level of  assistance: Independent with ADLs   PATIENT GOALS to improve communication  OBJECTIVE:  Today's Tx:             Aphasia Tx: During structured naming tasks (e.g. fill in, confrontation), pt provided fair-good approximations 50% of the time indep; improving to 90% with hierarchical cueing. Pt repeated 2-3 word phrases with fair repetition >50% of the time Utilizing multimodal communication including writing, speech, and gestures, pt able to tell SLP what she did over the weekend. Pt answered basic biographical questions with >70% accuracy with use of multimodal communication including use of SGD.  HEP: Explore SGD, HEP on SGD  PATIENT EDUCATION: Education details:as above Person educated: Patient Education method: Explanation, Demonstration, and Verbal cues Education comprehension: needs further education   GOALS: Goals reviewed with patient? No  SHORT TERM GOALS: Target date: 10 sessions  Pt will participate in further assessment of functional reading, writing, and repetition.  Baseline:  Goal status: MET  2.  Pt will follow 1-step directions with >50% accuracy with multimodal cues. Baseline:  Goal status: MET; new goal 03/05/23; Pt will follow 1-step directions with >80% accuracy with multimodal cues.  3.  Pt will repeat single words using a speech generating device in 50% of opportunities given frequent maximal verbal and maximal visual cues. Baseline:  Goal status: MET  4. With Moderate A-Maximal A, patient will perform structured naming tasks with fair-good approximations 50% of the time.  Baseline:  Goal Status: MET; With Moderate A-Maximal A, patient will perform structured naming tasks with fair-good approximations 70% of the time.   5. Using multimodal communication and assistance as needed, pt will answer basic biographical questions with 60% accuracy.   Baseline:    Goal Status: MET             6. Pt will repeat 2-3 word phrases with fair-good approximations >50% of  the time with maximal cueing.    Baseline: new goal, 03/05/23   Goal Status: INITIAL     LONG TERM GOALS: Target date: 03/20/23  With Min A, patient/family will demonstrate understanding of the following concepts: aphasia, spontaneous recovery, communication vs conversation, strengths/strategies to promote success in all communication settings. Baseline:  Goal status: PROGRESSING  2.  Using multimodal communication, pt will answer basic biographical questions in 8 out of 10 opportunities.  Baseline:  Goal status: PROGRESSING   ASSESSMENT:  CLINICAL IMPRESSION: Patient is a 48 y.o. female who was seen today for a speech-language treatment. 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 Pt's expressive communication is c/b severe word finding deficits, neologisms, phonemic and semantic paraphasias. Pt with islands of error free speech and well as automatic speech. Pt with concomitant apraxia of speech.This results in reduced express basic wants and needs. Pt also presents with receptive language deficits that impact her ability to answer basic yes/no questions and follow basic directions, although auditory comprehension is a relative strength. See details of tx session above.   OBJECTIVE IMPAIRMENTS include expressive language, receptive language, and aphasia.These impairments are limiting patient from return to work, managing medications, managing appointments, managing finances, household responsibilities, ADLs/IADLs, and effectively communicating at home and in community. Factors affecting potential to achieve goals and functional outcome are severity of impairments and time since stroke . Patient will benefit from skilled SLP services to address above impairments and improve overall function.  REHAB POTENTIAL: Good  PLAN: SLP FREQUENCY: 1-2x/week  SLP DURATION: 12 weeks  PLANNED INTERVENTIONS: Language facilitation, Cueing hierachy,  Internal/external aids, Functional tasks, Multimodal communication approach, and SLP instruction and feedback   Clyde Canterbury, M.S., CCC-SLP Speech-Language Pathologist [facility] [phone] Arnette Felts)  [facility] MAIN [facility] SERVICES [address] [place], [place], [address] Phone: [phone]   Fax:  [phone]

## 2023-04-09 NOTE — Therapy (Signed)
 OUTPATIENT OCCUPATIONAL THERAPY NEURO TREATMENT NOTE  Patient Name: Cheyenne Martinez MRN: [id number] DOB:1975-08-18, 48 y.o., female Today's Date: 04/09/2023  PCP: Dr. Burnadette Pop REFERRING PROVIDER: Joellyn Haff, PA  END OF SESSION:  OT End of Session - 04/09/23 0810     Visit Number 11    Number of Visits 24    Date for OT Re-Evaluation 04/03/23    Authorization Type 1 more visit approved after 04/09/23    Progress Note Due on Visit 10    OT Start Time 0800    OT Stop Time 0845    OT Time Calculation (min) 45 min    Activity Tolerance Patient tolerated treatment well    Behavior During Therapy WFL for tasks assessed/performed            Past Medical History:  Diagnosis Date   Diabetes mellitus without complication (HCC)    Hypertension    Stroke (HCC)    No past surgical history on file. Patient Active Problem List   Diagnosis Date Noted   CVA (cerebral vascular accident) (HCC) 07/01/2021   New onset type 2 diabetes mellitus (HCC) 07/01/2021   Dyslipidemia 07/01/2021   Hypertensive urgency 07/01/2021   ONSET DATE: 07/01/21  REFERRING DIAG: History of Cva, muscle weakness, lack of coordination  THERAPY DIAG:  Muscle weakness (generalized)  Other lack of coordination  History of CVA (cerebrovascular accident)  Rationale for Evaluation and Treatment: Rehabilitation  SUBJECTIVE:  SUBJECTIVE STATEMENT: Pt denies interest in Vivistim at this time following review of procedure/post procedure therapy.  Pamphlet provided for additional review as desired.  Pt accompanied by:  mother  PERTINENT HISTORY:  Pt known to this OT as pt participated in OT in this clinic after suffering a CVA in June of '23.   Per this writer's OT d/c note on 11/09/21: Pt was seen by OT for 18 sessions to address visual and RUE deficits following L PCA stroke. Pt has made strong functional gains, as noted by increased FOTO score from 36 at eval to 60 at discharge. Pt engages her RUE into self  care tasks as a fair-good stabilizer, but pt relies on L non-dominant arm for tasks which require strength and dexterity. Pt has increased attention to R side, requiring min vc on occasion for R hand proprioception. HEP was reviewed this date with pt/mother acknowledging understanding and benefits of consistent/daily participation to help pt continue to improve use of the RUE. OT encouraged new therapy referral in the new year if pt wishes to continue to focus on residual deficits post stroke. Discharge completed this date d/t visit limitations with insurance within this calendar year.   PRECAUTIONS: Fall  WEIGHT BEARING RESTRICTIONS: No  PAIN:  Are you having pain? No  FALLS: Has patient fallen in last 6 months? No  LIVING ENVIRONMENT: Lives with: lives with their family, Adult daughter 53 years old, and 2 sons 58 and 34 years old; mom lives 5 min away Lives in: house Stairs: 4 steps, no rail Has following equipment at home: None (walk in shower and standard toilet)  PLOF: Independent prior to CVA  PATIENT GOALS: improve speech, use of arm, improve leg strength  OBJECTIVE:  Note: Objective measures were completed at Evaluation unless otherwise noted.  HAND DOMINANCE: Right  ADLs: Overall ADLs: Uses RUE as a fair stabilizer to non-dominant L arm Transfers/ambulation related to ADLs:  Eating: cuts food with L hand cut only Grooming: uses L hand to manipulate grooming utensils; requires assist for bimanual tasks  such as putting up hair UB Dressing: indep to don a sports bra; unable to manage clothing fasteners LB Dressing: indep to don LB clothing including slip on shoes and elastic waist paints; unable to manage clothing fasteners Toileting: indep with non-dominant hand Bathing: modified indep with limited use of the R dominant hand Tub Shower transfers: indep (walk in shower) Equipment: none  IADLs: Shopping: pt now driving, indep Light housekeeping: shared between pt and  children Meal Prep: pt cooks daily, minimal use of the R dominant arm other than to stabilize meal prep items Community mobility: indep without use of AD Medication management: indep with pill Retail buyer: indep on phone  Handwriting: Increased time  MOBILITY STATUS:  R sided hemiparesis; able to amb community distances without AD  POSTURE COMMENTS:  R sided hemiparesis Sitting balance: Moves/returns truncal midpoint >2 inches in all planes  ACTIVITY TOLERANCE: Activity tolerance: WFL for tasks assessed  FUNCTIONAL OUTCOME MEASURES: FOTO: 61; predicted 62  UPPER EXTREMITY ROM:   Active ROM Left eval Right eval Right 04/02/23  Shoulder flexion  70 120  Shoulder abduction  70 135  Shoulder adduction     Shoulder extension     Shoulder internal rotation  Hand to iliac crest    Shoulder external rotation  [facility]   Elbow flexion     Elbow extension     Wrist flexion     Wrist extension     Wrist ulnar deviation     Wrist radial deviation     Wrist pronation     Wrist supination  72 75  (Blank rows = not tested)  UPPER EXTREMITY MMT:     MMT Right eval Right 04/02/23 Left eval  Shoulder flexion 3- 3+ (within available range) 4+  Shoulder abduction 3+ (within available range) 3+ (within available range) 4+  Shoulder adduction     Shoulder extension     Shoulder internal rotation     Shoulder external rotation     Middle trapezius     Lower trapezius     Elbow flexion 4- 4 4+  Elbow extension 4+ 4+ 4+  Wrist flexion 4- 4- 4+  Wrist extension 4- 4 4+  Wrist ulnar deviation     Wrist radial deviation     Wrist pronation     Wrist supination     (Blank rows = not tested)  HAND FUNCTION: Grip strength: Right: 19 lbs; Left: 69 lbs, Lateral pinch: Right: 5 lbs, Left: 21 lbs, and 3 point pinch: Right: 6 lbs, Left: 21 lbs 04/02/23: Grip strength: Right: 20 lbs; Left: 74 lbs, Lateral pinch: Right: 8 lbs, Left: 21 lbs, and 3 point pinch: Right: 8 lbs, Left:  21 lbs  COORDINATION: 9 Hole Peg test: Right: 3 pegs placed in 2 min 21 sec  sec; Left: 22 sec 04/02/23: Right: all 9 pegs in 3 min 45 sec.    SENSATION: Light touch: Impaired   EDEMA: No visible edema  MUSCLE TONE: RUE: Hypertonic  COGNITION: Overall cognitive status: Impaired; expressive and receptive aphasia  VISION ASSESSMENT: Not tested; limited by time constraints d/t pt arriving late to eval; will assess as needed in upcoming sessions/within functional contexts   PERCEPTION: Not tested this date; will assess in upcoming sessions  PRAXIS: Impaired: Motor planning  OBSERVATIONS: Pt appears happy to be back to OT to begin a 2nd round of rehab, and motivated to work on strengthening the R side for improving use for daily tasks.  TODAY'S TREATMENT:                                                                                                                              DATE: 04/09/23 Self Care: Condition management: Educ provided to pt and mother re: Vivistim procedure/therapy.  Therapeutic Exercise: -Pt. performed BUE strengthening in reciprocal motion on the SciFit for 8 min. with constant monitoring of the BUEs on work load level 4.  Pt alternated between forward and reverse rotations throughout 8 min. -Review of table slides, wall slides, and clasping hands to perform shoulder flexion overhead for increasing R shoulder flexibility, promoting overhead reach for ADLs.  Therapeutic Activity: -Facilitated R hand 3 point pinch, lateral pinch strengthening, and GMC/FMC, moving therapy resistant clothespins on/off vertical dowel.  Pt able to manage all colors, including 1 of 2 black most resistant pins. -Facilitated R active grasp/release/RUE pron/sup/elbow ext and forward and lateral reaching patterns working to collect clothespins from table top 1 by 1 and move them back to container using RUE.  Container position moved into a variety of locations on/off table top to challenge  above noted movements.  PATIENT EDUCATION: Education details: VNS with Vivistim Person educated: Patient and Parent Education method: Explanation, written pamphlet Education comprehension: verbalized understanding  HOME EXERCISE PROGRAM: Pink theraputty, active assisted shoulder flex/abd with table slide/wall slide or hands clasped  GOALS: Goals reviewed with patient? Yes  SHORT TERM GOALS: Target date: 02/20/23  Pt will be indep to perform HEP for improving RUE strength and coordination for daily tasks. Baseline:  Eval: Not yet initiated; 04/02/23: pt reports she uses her theraputty (uncertain how consistently) and routinely uses the R hand with daily tasks Goal status: in progress  LONG TERM GOALS: Target date: 04/03/23  Pt will increase FOTO score to 65 or better to indicate improvement in self perceived functional use of the R arm with daily tasks. Baseline: Eval: 61; predicted 62; 04/02/23: Will assess again at d/c Goal status: INITIAL  2.  Pt will increase R grip strength by 5-7 lbs in order to hold and transport ADL supplies in R dominant hand with increased confidence. Baseline: Eval: R grip strength 19 lbs; 04/02/23: R grip strength 20 lbs Goal status: improving/in progress  3.  Pt will increase R hand FMC/dexterity skills to enable pt to engage the R hand in manipulation of clothing fasteners.  Baseline: Eval: Assist needed with clothing fasteners; R 9 hole peg test: able to place 3 pegs in 2 min 21 sec; 04/02/23: R 9 hole peg test: Able to place all 9 pegs in 3 min 45 sec.  Goal status: Improving/in progress  4.  Pt will increase active R shoulder flexion and abd by 20 or more degrees in order to bimanually reach for items in closet at shoulder level.   Baseline: Eval: R shoulder flexion and abd 70*; pt uses L arm to reach for items at or above shoulder level; 04/02/23: R shoulder  flexion 120*, abd 135* Goal status: achieved  ASSESSMENT: CLINICAL IMPRESSION: Good tolerance to  UBE this date.  Pt shows good return demo of hands clasped to perform active assisted shoulder flexion; OT reinforced benefits of keeping this stretch in her weekly routine after d/c in order to maintain shoulder flexibility for UB ADLs and reaching into overhead cupboards.  Pt able to manage all colors of therapy resistant clothes pins this date, though requiring multiple trials for blue and black most resistive pins.  Provided education to pt and mother on Vivistim procedure, though pt verbalized disinterest at this time, and wants to continue to work on her recovery without this.  Pamphlet given to pt and was encouraged to review in case pt changes her mind in the future and wishes to learn more about the procedure. With remaining visits, pt will continue to benefit from focus on RUE strength, flexibility, and coordination skills in order to improve functional use of the RUE with daily tasks.  Planning d/c next visit d/t insurance limitations.  PERFORMANCE DEFICITS: in functional skills including ADLs, IADLs, coordination, dexterity, ROM, strength, flexibility, Fine motor control, Gross motor control, mobility, balance, body mechanics, decreased knowledge of use of DME, vision, and UE functional use, cognitive skills including thought and understand, and psychosocial skills including coping strategies, environmental adaptation, habits, interpersonal interactions, and routines and behaviors.   IMPAIRMENTS: are limiting patient from ADLs, IADLs, work, leisure, and social participation.   CO-MORBIDITIES: has co-morbidities such as global aphasia, R sided hemiparesis, HTN  that affects occupational performance. Patient will benefit from skilled OT to address above impairments and improve overall function.  MODIFICATION OR ASSISTANCE TO COMPLETE EVALUATION: No modification of tasks or assist necessary to complete an evaluation.  OT OCCUPATIONAL PROFILE AND HISTORY: Problem focused assessment: Including  review of records relating to presenting problem.  CLINICAL DECISION MAKING: Moderate - several treatment options, min-mod task modification necessary  REHAB POTENTIAL: Good  EVALUATION COMPLEXITY: Moderate    PLAN:  OT FREQUENCY: 1-2x/week  OT DURATION: 12 weeks  PLANNED INTERVENTIONS: 97168 OT Re-evaluation, 97535 self care/ADL training, 16109 therapeutic exercise, 97530 therapeutic activity, 97112 neuromuscular re-education, 97140 manual therapy, 97116 gait training, 60454 moist heat, 97010 cryotherapy, passive range of motion, balance training, functional mobility training, visual/perceptual remediation/compensation, psychosocial skills training, energy conservation, coping strategies training, patient/family education, and DME and/or AE instructions  RECOMMENDED OTHER SERVICES: Pt has orders for SLP and PT  CONSULTED AND AGREED WITH PLAN OF CARE: Patient and family member/caregiver  PLAN FOR NEXT SESSION: See above  Danelle Earthly, MS, OTR/L  04/09/23, 11:21 AM

## 2023-04-09 NOTE — Addendum Note (Signed)
 Addended by: Grier Rocher E on: 04/09/2023 10:14 AM   Modules accepted: Orders

## 2023-04-10 ENCOUNTER — Telehealth: Payer: Self-pay | Admitting: Physical Therapy

## 2023-04-10 NOTE — Telephone Encounter (Signed)
 Spoke with Cabin crew. She  States that order was received for [facility] Knee Cage, but reports that they will need recent face to face visit with referring MD for insurance prior Authorization.  PT called Pt via provided telephone number, and notified pt via voice mail of requirement for recent MD visit and documentation to address knee pain with knee cage.   Cheyenne Martinez PT, DPT  Physical Therapist - [place]  [facility]  2:31 PM 04/10/23

## 2023-04-11 ENCOUNTER — Ambulatory Visit: Payer: Medicaid Other | Admitting: Physical Therapy

## 2023-04-16 ENCOUNTER — Ambulatory Visit: Payer: Medicaid Other | Admitting: Physical Therapy

## 2023-04-16 ENCOUNTER — Ambulatory Visit: Payer: Medicaid Other

## 2023-04-16 DIAGNOSIS — R278 Other lack of coordination: Secondary | ICD-10-CM

## 2023-04-16 DIAGNOSIS — R41841 Cognitive communication deficit: Secondary | ICD-10-CM

## 2023-04-16 DIAGNOSIS — R4701 Aphasia: Secondary | ICD-10-CM

## 2023-04-16 DIAGNOSIS — Z8673 Personal history of transient ischemic attack (TIA), and cerebral infarction without residual deficits: Secondary | ICD-10-CM

## 2023-04-16 DIAGNOSIS — R262 Difficulty in walking, not elsewhere classified: Secondary | ICD-10-CM

## 2023-04-16 DIAGNOSIS — R482 Apraxia: Secondary | ICD-10-CM

## 2023-04-16 DIAGNOSIS — M6281 Muscle weakness (generalized): Secondary | ICD-10-CM

## 2023-04-16 DIAGNOSIS — R2681 Unsteadiness on feet: Secondary | ICD-10-CM

## 2023-04-16 NOTE — Therapy (Signed)
 OUTPATIENT OCCUPATIONAL THERAPY NEURO DISCHARGE NOTE  Patient Name: Cheyenne Martinez MRN: [id number] DOB:04-11-1975, 48 y.o., female Today's Date: 04/16/2023  PCP: Dr. Burnadette Pop REFERRING PROVIDER: Joellyn Haff, PA  END OF SESSION:  OT End of Session - 04/16/23 1306     Visit Number 12    Date for OT Re-Evaluation 04/03/23    OT Start Time 0803    OT Stop Time 0845    OT Time Calculation (min) 42 min    Activity Tolerance Patient tolerated treatment well    Behavior During Therapy [facility] for tasks assessed/performed            Past Medical History:  Diagnosis Date   Diabetes mellitus without complication (HCC)    Hypertension    Stroke (HCC)    No past surgical history on file. Patient Active Problem List   Diagnosis Date Noted   CVA (cerebral vascular accident) (HCC) 07/01/2021   New onset type 2 diabetes mellitus (HCC) 07/01/2021   Dyslipidemia 07/01/2021   Hypertensive urgency 07/01/2021   ONSET DATE: 07/01/21  REFERRING DIAG: History of Cva, muscle weakness, lack of coordination  THERAPY DIAG:  Muscle weakness (generalized)  Other lack of coordination  History of CVA (cerebrovascular accident)  Rationale for Evaluation and Treatment: Rehabilitation  SUBJECTIVE:  SUBJECTIVE STATEMENT: Pt reports doing well today.  Pt accompanied by:  mother  PERTINENT HISTORY:  Pt known to this OT as pt participated in OT in this clinic after suffering a CVA in June of '23.   Per this writer's OT d/c note on 11/09/21: Pt was seen by OT for 18 sessions to address visual and RUE deficits following L PCA stroke. Pt has made strong functional gains, as noted by increased FOTO score from 36 at eval to 60 at discharge. Pt engages her RUE into self care tasks as a fair-good stabilizer, but pt relies on L non-dominant arm for tasks which require strength and dexterity. Pt has increased attention to R side, requiring min vc on occasion for R hand proprioception. HEP was reviewed this  date with pt/mother acknowledging understanding and benefits of consistent/daily participation to help pt continue to improve use of the RUE. OT encouraged new therapy referral in the new year if pt wishes to continue to focus on residual deficits post stroke. Discharge completed this date d/t visit limitations with insurance within this calendar year.   PRECAUTIONS: Fall  WEIGHT BEARING RESTRICTIONS: No  PAIN:  Are you having pain? No  FALLS: Has patient fallen in last 6 months? No  LIVING ENVIRONMENT: Lives with: lives with their family, Adult daughter 63 years old, and 2 sons 8 and 29 years old; mom lives 5 min away Lives in: house Stairs: 4 steps, no rail Has following equipment at home: None (walk in shower and standard toilet)  PLOF: Independent prior to CVA  PATIENT GOALS: improve speech, use of arm, improve leg strength  OBJECTIVE:  Note: Objective measures were completed at Evaluation unless otherwise noted.  HAND DOMINANCE: Right  ADLs: Overall ADLs: Uses RUE as a fair stabilizer to non-dominant L arm Transfers/ambulation related to ADLs:  Eating: cuts food with L hand cut only Grooming: uses L hand to manipulate grooming utensils; requires assist for bimanual tasks such as putting up hair UB Dressing: indep to don a sports bra; unable to manage clothing fasteners LB Dressing: indep to don LB clothing including slip on shoes and elastic waist paints; unable to manage clothing fasteners Toileting: indep with non-dominant hand Bathing:  modified indep with limited use of the R dominant hand Tub Shower transfers: indep (walk in shower) Equipment: none  IADLs: Shopping: pt now driving, indep Light housekeeping: shared between pt and children Meal Prep: pt cooks daily, minimal use of the R dominant arm other than to stabilize meal prep items Community mobility: indep without use of AD Medication management: indep with pill Retail buyer: indep on  phone  Handwriting: Increased time  MOBILITY STATUS:  R sided hemiparesis; able to amb community distances without AD  POSTURE COMMENTS:  R sided hemiparesis Sitting balance: Moves/returns truncal midpoint >2 inches in all planes  ACTIVITY TOLERANCE: Activity tolerance: WFL for tasks assessed  FUNCTIONAL OUTCOME MEASURES: FOTO: 61; predicted 62  UPPER EXTREMITY ROM:   Active ROM Left eval Right eval Right 04/02/23 Right 04/16/23  Shoulder flexion  70 120 120  Shoulder abduction  70 135 135  Shoulder adduction      Shoulder extension      Shoulder internal rotation  Hand to iliac crest   Hand to mid lower back but glides over iliac crest  Shoulder external rotation  [facility]  Elbow flexion      Elbow extension      Wrist flexion      Wrist extension      Wrist ulnar deviation      Wrist radial deviation      Wrist pronation      Wrist supination  72 75 72  (Blank rows = not tested)  UPPER EXTREMITY MMT:     MMT Right eval Right 04/02/23 Right 04/16/23 Left eval Left 04/16/23  Shoulder flexion 3- 3+ (within available range) 3+ (within available range) 4+ 5  Shoulder abduction 3+ (within available range) 3+ (within available range) 3+ (within available range) 4+ 5  Shoulder adduction       Shoulder extension       Shoulder internal rotation       Shoulder external rotation       Middle trapezius       Lower trapezius       Elbow flexion 4- 4 4 4+ 5  Elbow extension 4+ 4+ 4+ 4+ 5  Wrist flexion 4- 4- 4 4+ 5  Wrist extension 4- 4 4+ 4+ 5  Wrist ulnar deviation       Wrist radial deviation       Wrist pronation       Wrist supination       (Blank rows = not tested)  HAND FUNCTION: Grip strength: Right: 19 lbs; Left: 69 lbs, Lateral pinch: Right: 5 lbs, Left: 21 lbs, and 3 point pinch: Right: 6 lbs, Left: 21 lbs 04/02/23: Grip strength: Right: 20 lbs; Left: 74 lbs, Lateral pinch: Right: 8 lbs, Left: 21 lbs, and 3 point pinch: Right: 8 lbs, Left: 21 lbs 04/16/23: Grip  strength: Right: 21 lbs; Left: 73 lbs, Lateral pinch: Right: 9 lbs, Left: 21 lbs, and 3 point pinch: Right: 9 lbs, Left: 22 lbs  COORDINATION: 9 Hole Peg test: Right: 3 pegs placed in 2 min 21 sec  sec; Left: 22 sec 04/02/23: Right: all 9 pegs in 3 min 45 sec.   04/16/23: Right: all 9 pegs in 3 min 42 sec.   SENSATION: Light touch: Impaired   EDEMA: No visible edema  MUSCLE TONE: RUE: Hypertonic  COGNITION: Overall cognitive status: Impaired; expressive and receptive aphasia  VISION ASSESSMENT: Not tested; limited by time constraints d/t pt arriving late to  eval; will assess as needed in upcoming sessions/within functional contexts   PERCEPTION: Not tested this date; will assess in upcoming sessions  PRAXIS: Impaired: Motor planning  OBSERVATIONS: Pt appears happy to be back to OT to begin a 2nd round of rehab, and motivated to work on strengthening the R side for improving use for daily tasks.   TODAY'S TREATMENT:                                                                                                                              DATE: 04/16/23 Therapeutic Exercise: -Pt. performed BUE strengthening in reciprocal motion on the [employer] for 8 min. with constant monitoring of the BUEs on work load level 4.  Pt alternated between forward and reverse rotations throughout 8 min. -Review of table slides, wall slides, and clasping hands to perform shoulder flexion overhead for increasing R shoulder flexibility, promoting overhead reach for ADLs. -Recommendation for ongoing [facility] activities as part of HEP (pt has written handout in the home)  Therapeutic Activity: -Objective measures taken and goals updated and reviewed for d/c.   -Facilitated bilat hand coordination/R hand dexterity skills working to pick up washers from resistive magnetic dish with R hand and place them over a diagonal dowel which was held in L hand.   PATIENT EDUCATION: Education details: Progress towards goals; HEP  recommendations Person educated: Patient and Parent Education method: Explanation, written handout Education comprehension: verbalized understanding and returned demonstration  HOME EXERCISE PROGRAM: Pink theraputty, active assisted shoulder flex/abd with table slide/wall slide or hands clasped  GOALS: Goals reviewed with patient? Yes  SHORT TERM GOALS: Target date: 02/20/23  Pt will be indep to perform HEP for improving RUE strength and coordination for daily tasks. Baseline:  Eval: Not yet initiated; 04/02/23: pt reports she uses her theraputty (uncertain how consistently) and routinely uses the R hand with daily tasks; 04/16/23: Pt indep with theraputty, FMC exercises, and flexibility exercises for R shoulder Goal status: achieved  LONG TERM GOALS: Target date: 04/03/23  Pt will increase FOTO score to 65 or better to indicate improvement in self perceived functional use of the R arm with daily tasks. Baseline: Eval: 61; predicted 62; 04/02/23: Will assess again at d/c; 04/16/23: FOTO d/c'd  Goal status: d/c  2.  Pt will increase R grip strength by 5-7 lbs in order to hold and transport ADL supplies in R dominant hand with increased confidence. Baseline: Eval: R grip strength 19 lbs; 04/02/23: R grip strength 20 lbs; 04/16/23: R grip strength 21 lbs Goal status: improving; partially achieved   3.  Pt will increase R hand FMC/dexterity skills to enable pt to engage the R hand in manipulation of clothing fasteners.  Baseline: Eval: Assist needed with clothing fasteners; R 9 hole peg test: able to place 3 pegs in 2 min 21 sec; 04/02/23: R 9 hole peg test: Able to place all 9 pegs in 3 min 45 sec; 04/16/23: R 9  hole peg test: 3 min 42 sec; pt does acknowledge improvement in manipulation of her clothing fasteners. Goal status: Improving; achieved  4.  Pt will increase active R shoulder flexion and abd by 20 or more degrees in order to bimanually reach for items in closet at shoulder level.   Baseline:  Eval: R shoulder flexion and abd 70*; pt uses L arm to reach for items at or above shoulder level; 04/02/23: R shoulder flexion 120*, abd 135*; 04/16/23: same as 04/02/23 Goal status: achieved  ASSESSMENT: CLINICAL IMPRESSION: Pt seen by OT for 12 visits to focus on RUE strengthening, coordination training, HEP review, and ADL training d/t functional decline related to hx of CVA.  Pt either achieved or improved upon 4 of 4 OT goals, demonstrating good improvements in strength throughout the RUE, and very good gains with R hand [facility] skills (see above).  Pt acknowledges independence with HEP and has been encouraged to continue to engage the RUE with all daily tasks as able.  Although objective measures and functional improvements have not yet shown a plateau, discharge is being completed this date d/t yearly insurance visit limitations.  Pt continues to present with RUE weakness and impairments in gross and [facility] skills which impact her performance with daily tasks.  When eligible, pt would benefit from additional skilled OT in order to maximize efficiency and functional use of the RUE with daily tasks in order to reduce burden of care on family.  PERFORMANCE DEFICITS: in functional skills including ADLs, IADLs, coordination, dexterity, ROM, strength, flexibility, Fine motor control, Gross motor control, mobility, balance, body mechanics, decreased knowledge of use of DME, vision, and UE functional use, cognitive skills including thought and understand, and psychosocial skills including coping strategies, environmental adaptation, habits, interpersonal interactions, and routines and behaviors.   IMPAIRMENTS: are limiting patient from ADLs, IADLs, work, leisure, and social participation.   CO-MORBIDITIES: has co-morbidities such as global aphasia, R sided hemiparesis, HTN  that affects occupational performance. Patient will benefit from skilled OT to address above impairments and improve overall  function.  MODIFICATION OR ASSISTANCE TO COMPLETE EVALUATION: No modification of tasks or assist necessary to complete an evaluation.  OT OCCUPATIONAL PROFILE AND HISTORY: Problem focused assessment: Including review of records relating to presenting problem.  CLINICAL DECISION MAKING: Moderate - several treatment options, min-mod task modification necessary  REHAB POTENTIAL: Good  EVALUATION COMPLEXITY: Moderate    PLAN:  OT FREQUENCY: 1-2x/week  OT DURATION: 12 weeks  PLANNED INTERVENTIONS: 97168 OT Re-evaluation, 97535 self care/ADL training, 16109 therapeutic exercise, 97530 therapeutic activity, 97112 neuromuscular re-education, 97140 manual therapy, 97116 gait training, 60454 moist heat, 97010 cryotherapy, passive range of motion, balance training, functional mobility training, visual/perceptual remediation/compensation, psychosocial skills training, energy conservation, coping strategies training, patient/family education, and DME and/or AE instructions  RECOMMENDED OTHER SERVICES: Pt has orders for SLP and PT  CONSULTED AND AGREED WITH PLAN OF CARE: Patient and family member/caregiver  PLAN FOR NEXT SESSION: d/c this visit  Danelle Earthly, MS, OTR/L  04/16/23, 1:08 PM

## 2023-04-16 NOTE — Therapy (Unsigned)
 OUTPATIENT PHYSICAL THERAPY NEURO TREATMENT/  D/c Summary.     Patient Name: Cheyenne Martinez MRN: [id number] DOB:12-02-1975, 48 y.o., female Today's Date: 04/16/2023   PCP:  No PCP  REFERRING PROVIDER:  Ignacia Bayley PA  END OF SESSION:  PT End of Session - 04/16/23 0914     Visit Number 13    Number of Visits 24    Date for PT Re-Evaluation 05/21/23   corrected   Progress Note Due on Visit 10    PT Start Time 0931    PT Stop Time 1011    PT Time Calculation (min) 40 min    Equipment Utilized During Treatment Gait belt    Activity Tolerance Patient tolerated treatment well    Behavior During Therapy WFL for tasks assessed/performed              Past Medical History:  Diagnosis Date   Diabetes mellitus without complication (HCC)    Hypertension    Stroke (HCC)    No past surgical history on file. Patient Active Problem List   Diagnosis Date Noted   CVA (cerebral vascular accident) (HCC) 07/01/2021   New onset type 2 diabetes mellitus (HCC) 07/01/2021   Dyslipidemia 07/01/2021   Hypertensive urgency 07/01/2021    ONSET DATE:  07/01/2021  REFERRING DIAG: N56.21 (ICD-10-CM) - Personal history of transient ischemic attack (TIA), and cerebral infarction without residual deficits  THERAPY DIAG:   Muscle weakness (generalized)   Unsteadiness on feet   Hemiplegia and hemiparesis following cerebral infarction affecting  Right dominant side (HCC)   Other abnormalities of gait and mobility   Acute ischemic L ACA stroke (HCC)  Rationale for Evaluation and Treatment: Rehabilitation  SUBJECTIVE:                                                                                                                                                                                             SUBJECTIVE STATEMENT:    PT explained conversation with [facility] and requirement for prior authorization for additional bracing to protect knee.   Pt reports that she is ready to be  done with PT.  No pain reported at start of PT treatment.    Pt accompanied by:  Mother  PERTINENT HISTORY: Pt is a 48 y/o female who had 2 episodes of stroke and has received Physical therapy in the same OP clinic and has returned due to decline in functional mobility and  pain RLE with WB activities.  Pt with PMHx:   CVA x 2,   type 2 diabetes mellitus (HCC)   Hypertensive urgency   Dyslipidemia  PAIN:  Are you having pain? Yes: Pain location: 0/10 Pain description:   Aggravating factors: WB Relieving factors: Rest  PRECAUTIONS: Fall  RED FLAGS: None   WEIGHT BEARING RESTRICTIONS: No  FALLS: Has patient fallen in last 6 months? No  LIVING ENVIRONMENT: Lives with: lives with their family Lives in: House/apartment Stairs: Yes: External: 4 steps; can reach both Has following equipment at home: None  PLOF: Independent with basic ADLs, Independent with household mobility without device, Independent with homemaking with ambulation, Independent with gait, Independent with transfers, and Leisure: driving  PATIENT GOALS: "I want to walk better and without falling and pain.   OBJECTIVE:  Note: Objective measures were completed at Evaluation unless otherwise noted.  DIAGNOSTIC FINDINGS: None for 2024 07/04/2021:  Evolving recent infarcts as seen on recent MRI. There is likely mild petechial hemorrhage associated with the left parieto-occipital infarct. No significant mass effect.   Plaque at the left greater than right ICA origins without hemodynamically significant stenosis. Marked stenosis at the right vertebral origin without apparent flow limitation.   Intracranial atherosclerosis involving anterior and posterior circulations. No proximal intracranial vessel occlusion.  COGNITION: Overall cognitive status: Within functional limits for tasks assessed   SENSATION: WFL   MUSCLE TONE: RLE: Moderate and Hypertonic  MUSCLE LENGTH: Hamstrings: Right 20 deg; Left WFL  deg   POSTURE: rounded shoulders and R sided lean  LOWER EXTREMITY ROM:    PROM WFL AROM WFL    LOWER EXTREMITY MMT:    MMT Right Eval Left Eval R 3/17 L 3/17  Hip flexion 3/5 4-/5 4-/5 4/5  Hip extension 3/5 4/5 4-/5 4+/5  Hip abduction 3/5 4/5 4-/5 4+/5  Hip adduction 4-/5 4/5 4-/5 5/5  Hip internal rotation 3/5 4-/5    Hip external rotation 3/5 4-/5    Knee flexion 3+/5 4/5 4-/5 5/5  Knee extension 3/5 4/5 4-/5 5/5  Ankle dorsiflexion 3-/5 4/5 3-/5 5/5  Ankle plantarflexion      Ankle inversion      Ankle eversion      (Blank rows = not tested)  BED MOBILITY:  Independent  TRANSFERS: Assistive device utilized: None  Sit to stand: Complete Independence Stand to sit: Complete Independence Chair to chair: Complete Independence Floor: SBA  RAMP:  Level of Assistance: CGA Assistive device utilized: None Ramp Comments: Pt needs CGA to ascend and Min to descend.   CURB:  Level of Assistance: CGA Assistive device utilized: None Curb Comments: Due to weakness, unsteady on feet and difficulty raising up concentrically.   STAIRS: Level of Assistance: CGA Stair Negotiation Technique: Step to Pattern Forwards with Single Rail on Left Number of Stairs: 4  Height of Stairs: 6 inch  Comments: Slow with heavy reliance on HR with one step a t a time.   GAIT: Gait pattern: step through pattern, decreased arm swing- Right, decreased step length- Right, decreased stance time- Left, decreased stride length, decreased ankle dorsiflexion- Right, and genu recurvatum- Right Distance walked: 65ft Assistive device utilized: None Level of assistance: SBA Comments: Impulsive, and needs VC for safety and proper technique  FUNCTIONAL TESTS:  8 reps in 30 seconds chair stand test Timed up and go (TUG): 13secs 10 meter walk test: 12 secs= 1.45m/sec Balance Test: Unable to Tandem stand, unable to SL stand, Rhomberg on NBOS x 20 secs  PATIENT SURVEYS:  LEFS 56/80 FOTO Complete  FOTO next session.  TODAY'S TREATMENT: DATE: 04/16/2023  - Seated Long Arc Quad  - 10 reps - Seated Heel Slide  -  10 reps - 2 hold - Sit to Stand   - 10 reps - Seated Toe Raise - 15 reps - 2 hold - Standing Alternating Knee Flexion   - 10 reps - 2 hold - Standing Hip Abduction with Counter Support   - 10 reps - Standing Hip Flexion  - 10 reps  30 sec sit<>stand 14 reps.  PT instructed pt in TUG: 10.9 sec (average of 3 trials; >13.5 sec indicates increased fall risk)   10 Meter Walk Test: Patient instructed to walk 10 meters (32.8 ft) as quickly and as safely as possible at their normal speed x2 and at a fast speed x2. Time measured from 2 meter mark to 8 meter mark to accommodate ramp-up and ramp-down.  Average Fast speed: 1.14m/s ( 8.45 sec ) Cut off scores: <0.4 m/s = household Ambulator, 0.4-0.8 m/s = limited community Ambulator, >0.8 m/s = community Ambulator, >1.2 m/s = crossing a street, <1.0 = increased fall risk MCID 0.05 m/s (small), 0.13 m/s (moderate), 0.06 m/s (significant)  (ANPTA Core Set of Outcome Measures for Adults with Neurologic Conditions, 2018)    Throughout session PT provided CGA with for safety unless otherwise noted. Min cues for improved HS activation and reduced hip hike with limb advancement on the RLE as able.      PATIENT EDUCATION: Education details: Safety, findings, rehab progression, significance of HEP.  Use of AFO vs knee cage.   Person educated: Patient and Parent Education method: Explanation Education comprehension: verbalized understanding, verbal cues required, and needs further education  HOME EXERCISE PROGRAM:  Access Code: [email or web address] URL: [email or web address] Date: 02/19/2023 Prepared by: Maureen Ralphs  Exercises - Standing Single Leg Hip Circles  - 3 x weekly - 3 sets - 10 reps - Standing Terminal Knee Extension with Resistance  - 3 x weekly - 3 sets - 10 reps   Access Code: [email or web address] URL:  [email or web address] Date: 04/16/2023 Prepared by: Grier Rocher  Exercises - Seated Long Arc Quad  - 1 x daily - 7 x weekly - 3 sets - 10 reps - Seated Heel Slide  - 1 x daily - 7 x weekly - 3 sets - 10 reps - 2 hold - Sit to Stand  - 1 x daily - 7 x weekly - 3 sets - 10 reps - Seated Toe Raise  - 1 x daily - 7 x weekly - 3 sets - 15 reps - 2 hold - Standing Alternating Knee Flexion  - 1 x daily - 7 x weekly - 3 sets - 10 reps - 2 hold - Standing Hip Abduction with Counter Support  - 1 x daily - 7 x weekly - 3 sets - 10 reps - Standing Hip Flexion  - 1 x daily - 7 x weekly - 3 sets - 10 reps  GOALS: Goals reviewed with patient? Yes  SHORT TERM GOALS: Target date: 01/12/2023  Patient will be independent in home exercise program to improve strength/mobility for better functional independence with ADLs.  Baseline: HEP administered 12/26/2022 Goal status: MET   LONG TERM GOALS: Target date: 05/21/2023  Patient will complete >12 times sit to stand test in 30 seconds indicating an increased LE strength and improved balance.  Baseline: 8 reps in 30 secs 2/24: 10 in 30 sec  3/17: 14 in 30  Goal status: MET   2.  Pt will improve LEFS score >64/80  Baseline: 56/80  2/24: 68/80 (question accuracy given speech deficits) Goal status: MET  3.  Pt  will improve Hamstring Length to <10 degrees to improve LE function and balance Baseline: 20 deg Hamstring length 2/24: 18deg R HS length  Goal status:  in progress   4.  Pt will tandem stand for 30 secs without LOB.  Baseline: Unable 2/24: 30 sec with R foot in front . 4 sec with L foot in front 3/17: 30 sec R foot in front, 10 sec with LLE in front.  Goal status: in progress   5.Pt will complete TUG in <10 secs to improve balance and safety with all functional mobility.  Baseline: 13secs 2/24: 11.59sec  3/17: 10.9 sec averaged ot 3 trials.  Goal status: in progress   6.  Pt will complete 10 M gait speed in <9 secs to  demonstrate safety with community level ambulation Baseline: 1.82m/sec 2/24: 0.98 m/s  3/17: 1.55m/s ( 8.45 sec ) Goal status: MET   7.  Pt will improve FOTO score by 5 points  Baseline: 64 Goal status: INITIAL   ASSESSMENT:  CLINICAL IMPRESSION:  Pt performed well with PT treatment and interventions. PT re-assessed LTG due to limited Visits approved by insurance. Pt reports that she is ready to be done with PT. Pt noted to have improved gait speed, reduced time on TUG, and improved 30 sec sit<>stand. PT expanded HEP. Education for benefits for [place] knee cage for limitation of knee GR and reduce pain.  Pt will continue to benefit from skilled therapy to address remaining deficits in order to improve overall QoL and return to PLOF.  PT will cease PT treatment at this time due to progress, desire to stop PT and limited PT Visits from insurance.     OBJECTIVE IMPAIRMENTS: Abnormal gait, decreased activity tolerance, decreased balance, decreased coordination, decreased endurance, decreased mobility, difficulty walking, decreased ROM, decreased strength, and decreased safety awareness.   ACTIVITY LIMITATIONS: lifting, bending, stairs, and locomotion level  PARTICIPATION LIMITATIONS: driving, shopping, community activity, and stairs  PERSONAL FACTORS: Age, Time since onset of injury/illness/exacerbation, and previous benefit from the skilled PT interventions  are also affecting patient's functional outcome.   REHAB POTENTIAL: Good  CLINICAL DECISION MAKING: Stable/uncomplicated  EVALUATION COMPLEXITY: Low  PLAN:  PT FREQUENCY: 1-2x/week  PT DURATION: 12 weeks  PLANNED INTERVENTIONS: 97164- PT Re-evaluation, 97110-Therapeutic exercises, 97530- Therapeutic activity, 97112- Neuromuscular re-education, 97535- Self Care, 78295- Manual therapy, [phone]- Gait training, [phone]- Orthotic Fit/training, Patient/Family education, Balance training, Stair training, Taping, Cryotherapy, and Moist  heat  PLAN FOR NEXT SESSION:   N.a   Grier Rocher PT, DPT  Physical Therapist - [place]  [facility]  9:16 AM 04/16/23

## 2023-04-16 NOTE — Therapy (Signed)
 OUTPATIENT SPEECH LANGUAGE PATHOLOGY DISCHARGE SUMMARY   Patient Name: Cheyenne Martinez MRN: [id number] DOB:[birth year], 48 y.o., female Today's Date: 04/16/2023  PCP: No PCP REFERRING PROVIDER: Ignacia Bayley, PA-C    End of Session - 04/16/23 0923     Visit Number 15    Number of Visits 24    Date for SLP Re-Evaluation 06/18/23    Authorization Type  Medicaid Prepaid [facility]    Authorization Time Period 27 combined PT/OT/ST visits until 01/30/24    Authorization - Visit Number 15    SLP Start Time 0845    SLP Stop Time  0920    SLP Time Calculation (min) 35 min    Activity Tolerance Patient tolerated treatment well             Past Medical History:  Diagnosis Date   Diabetes mellitus without complication (HCC)    Hypertension    Stroke (HCC)    No past surgical history on file. 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 ED on 07/01/2021 with report of sudden onset of memory difficulty and slurred speech.    PAIN:  Are you having pain? No  FALLS: Has patient fallen in last 6 months?  No  LIVING ENVIRONMENT: Lives with: lives with their family Lives in: House/apartment  PLOF:  Level of assistance: Independent with ADLs   PATIENT GOALS to improve  communication  OBJECTIVE:  Today's Tx:             Aphasia Tx: During structured naming tasks (e.g. fill in, confrontation), pt provided fair-good approximations 50% of the time indep; improving to 90% with hierarchical cueing. Pt repeated 2-3 word phrases with fair repetition >70% of the time. Utilizing multimodal communication including writing, speech, and gestures, pt able to tell SLP what she did over the weekend. Pt answered basic biographical questions with 100% accuracy with use of multimodal communication including use of SGD and min verbal cues/encouragement to utilize SGD. Reviewed how to navigate to and from "Therapy" tab on SGD. Pt able to do so with extra time. Written directions provided.   Reviewed progress to date, communication strategies, and SLP POC. Pt has exhausted her approved number of visits. Reviewed importance of utilizing "Therapy" tab for HEP at d/c.   HEP: HEP on SGD  PATIENT EDUCATION: Education details:as above Person educated: Patient, mother Education method: Explanation, Demonstration, and Verbal cues Education comprehension: met   GOALS: Goals reviewed with patient? No  SHORT TERM GOALS: Target date: 10 sessions  Pt will participate in further assessment of functional reading, writing, and repetition.  Baseline:  Goal status: MET  2.  Pt will follow 1-step directions with >50% accuracy with multimodal cues. Baseline:  Goal status: MET; new goal 03/05/23; Pt will follow 1-step directions with >80% accuracy with multimodal cues - MET  3.  Pt will repeat single words using a  speech generating device in 50% of opportunities given frequent maximal verbal and maximal visual cues. Baseline:  Goal status: MET  4. With Moderate A-Maximal A, patient will perform structured naming tasks with fair-good approximations 50% of the time.   Baseline:  Goal Status: MET; With Moderate A-Maximal A, patient will perform structured naming tasks with fair-good  approximations 70% of the time - MET  5. Using multimodal communication and assistance as needed, pt will answer basic biographical questions with 60% accuracy.   Baseline:    Goal Status: MET             6. Pt will repeat 2-3 word phrases with fair-good approximations >50% of the time with maximal cueing.    Baseline: new goal, 03/05/23   Goal Status: MET     LONG TERM GOALS: Target date: 03/20/23  With Min A, patient/family will demonstrate understanding of the following concepts: aphasia, spontaneous recovery, communication vs conversation, strengths/strategies to promote success in all communication settings. Baseline:  Goal status: MET  2.  Using multimodal communication, pt will answer basic biographical questions in 8 out of 10 opportunities.  Baseline:  Goal status: MET   ASSESSMENT:  CLINICAL IMPRESSION: Patient is a 48 y.o. female who was seen today for a speech-language treatment. 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 Pt's expressive communication is c/b severe word finding deficits, neologisms, phonemic and semantic paraphasias. Pt with islands of error free speech and well as automatic speech. Pt with concomitant apraxia of speech.This results in reduced express basic wants and needs. Pt also presents with receptive language deficits that impact her ability to answer basic yes/no questions and follow basic directions, although auditory comprehension is a relative strength. During course of skilled ST, pt has made significant progress in functional communication. Pt has secured SGD for improved total communication. See details of tx session above. Pt has met all ST goals and has exhausted the number of approved visits. ST to be d/c'd at this time.  OBJECTIVE IMPAIRMENTS include expressive language, receptive language, and aphasia.These impairments are limiting patient from return to work, managing medications, managing appointments,  managing finances, household responsibilities, ADLs/IADLs, and effectively communicating at home and in community. Factors affecting potential to achieve goals and functional outcome are severity of impairments and time since stroke . Patient will benefit from skilled SLP services to address above impairments and improve overall function.  REHAB POTENTIAL: Good  PLAN: D/C ST   Clyde Canterbury, M.S., CCC-SLP Speech-Language Pathologist [facility] [id number] Arnette Felts)  [facility] MAIN [facility] SERVICES [address] [place], [place], [address] Phone: [phone]   Fax:  [phone]

## 2023-04-18 ENCOUNTER — Ambulatory Visit: Payer: Medicaid Other | Admitting: Physical Therapy

## 2023-04-23 ENCOUNTER — Ambulatory Visit: Payer: Medicaid Other

## 2023-04-23 ENCOUNTER — Ambulatory Visit: Payer: Medicaid Other | Admitting: Physical Therapy

## 2023-04-30 ENCOUNTER — Ambulatory Visit: Payer: Medicaid Other | Admitting: Physical Therapy

## 2023-05-07 ENCOUNTER — Ambulatory Visit: Payer: Medicaid Other | Admitting: Physical Therapy

## 2023-05-14 ENCOUNTER — Ambulatory Visit: Payer: Medicaid Other | Admitting: Physical Therapy

## 2023-05-21 ENCOUNTER — Ambulatory Visit: Payer: Medicaid Other | Admitting: Physical Therapy

## 2023-05-28 ENCOUNTER — Ambulatory Visit: Payer: Medicaid Other | Admitting: Physical Therapy

## 2023-06-04 ENCOUNTER — Ambulatory Visit: Payer: Medicaid Other | Admitting: Physical Therapy

## 2023-06-11 ENCOUNTER — Ambulatory Visit: Payer: Medicaid Other | Admitting: Physical Therapy

## 2023-06-18 ENCOUNTER — Ambulatory Visit: Payer: Medicaid Other | Admitting: Physical Therapy

## 2023-07-02 ENCOUNTER — Ambulatory Visit: Payer: Medicaid Other | Admitting: Physical Therapy

## 2023-07-09 ENCOUNTER — Ambulatory Visit: Payer: Medicaid Other | Admitting: Physical Therapy

## 2023-07-12 ENCOUNTER — Emergency Department
Admission: EM | Admit: 2023-07-12 | Discharge: 2023-07-12 | Disposition: A | Discharge status: Home / Self Care | Attending: Emergency Medicine | Admitting: Emergency Medicine

## 2023-07-12 ENCOUNTER — Other Ambulatory Visit: Payer: Self-pay

## 2023-07-12 DIAGNOSIS — E1122 Type 2 diabetes mellitus with diabetic chronic kidney disease: Secondary | ICD-10-CM | POA: Insufficient documentation

## 2023-07-12 DIAGNOSIS — E871 Hypo-osmolality and hyponatremia: Secondary | ICD-10-CM | POA: Insufficient documentation

## 2023-07-12 DIAGNOSIS — Z7984 Long term (current) use of oral hypoglycemic drugs: Secondary | ICD-10-CM | POA: Insufficient documentation

## 2023-07-12 DIAGNOSIS — Z940 Kidney transplant status: Secondary | ICD-10-CM | POA: Diagnosis not present

## 2023-07-12 DIAGNOSIS — N189 Chronic kidney disease, unspecified: Secondary | ICD-10-CM | POA: Diagnosis not present

## 2023-07-12 DIAGNOSIS — R739 Hyperglycemia, unspecified: Secondary | ICD-10-CM

## 2023-07-12 DIAGNOSIS — E1165 Type 2 diabetes mellitus with hyperglycemia: Secondary | ICD-10-CM | POA: Insufficient documentation

## 2023-07-12 LAB — COMPREHENSIVE METABOLIC PANEL WITH GFR
ALT: 10 U/L (ref 0–44)
AST: 19 U/L (ref 15–41)
Albumin: 3.8 g/dL (ref 3.5–5.0)
Alkaline Phosphatase: 86 U/L (ref 38–126)
Anion gap: 10 (ref 5–15)
BUN: 20 mg/dL (ref 6–20)
CO2: 26 mmol/L (ref 22–32)
Calcium: 9.1 mg/dL (ref 8.9–10.3)
Chloride: 91 mmol/L — ABNORMAL LOW (ref 98–111)
Creatinine, Ser: 1.91 mg/dL — ABNORMAL HIGH (ref 0.44–1.00)
GFR, Estimated: 33 mL/min — ABNORMAL LOW (ref >60)
Glucose, Bld: 567 mg/dL (ref 70–99)
Potassium: 3.9 mmol/L (ref 3.5–5.1)
Sodium: 127 mmol/L — ABNORMAL LOW (ref 135–145)
Total Bilirubin: 0.6 mg/dL (ref 0.0–1.2)
Total Protein: 7.4 g/dL (ref 6.5–8.1)

## 2023-07-12 LAB — CBC WITH DIFFERENTIAL/PLATELET
Abs Immature Granulocytes: 0.01 10*3/uL (ref 0.00–0.07)
Basophils Absolute: 0.1 10*3/uL (ref 0.0–0.1)
Basophils Relative: 1 %
Eosinophils Absolute: 0.1 10*3/uL (ref 0.0–0.5)
Eosinophils Relative: 2 %
HCT: 28.9 % — ABNORMAL LOW (ref 36.0–46.0)
Hemoglobin: 10.2 g/dL — ABNORMAL LOW (ref 12.0–15.0)
Immature Granulocytes: 0 %
Lymphocytes Relative: 45 %
Lymphs Abs: 2.8 10*3/uL (ref 0.7–4.0)
MCH: 27.3 pg (ref 26.0–34.0)
MCHC: 35.3 g/dL (ref 30.0–36.0)
MCV: 77.3 fL — ABNORMAL LOW (ref 80.0–100.0)
Monocytes Absolute: 0.4 10*3/uL (ref 0.1–1.0)
Monocytes Relative: 6 %
Neutro Abs: 2.8 10*3/uL (ref 1.7–7.7)
Neutrophils Relative %: 46 %
Platelets: 246 10*3/uL (ref 150–400)
RBC: 3.74 MIL/uL — ABNORMAL LOW (ref 3.87–5.11)
RDW: 12 % (ref 11.5–15.5)
WBC: 6.2 10*3/uL (ref 4.0–10.5)
nRBC: 0 % (ref 0.0–0.2)

## 2023-07-12 LAB — URINALYSIS, ROUTINE W REFLEX MICROSCOPIC
Bilirubin Urine: NEGATIVE
Glucose, UA: >=500 mg/dL — AB
Hgb urine dipstick: NEGATIVE
Ketones, ur: NEGATIVE mg/dL
Leukocytes,Ua: NEGATIVE
Nitrite: NEGATIVE
Protein, ur: NEGATIVE mg/dL
Specific Gravity, Urine: 1.016 (ref 1.005–1.030)
pH: 5 (ref 5.0–8.0)

## 2023-07-12 LAB — CBG MONITORING, ED
Glucose-Capillary: 540 mg/dL (ref 70–99)
Glucose-Capillary: 424 mg/dL — ABNORMAL HIGH (ref 70–99)

## 2023-07-12 MED ORDER — INSULIN ASPART 100 UNIT/ML IJ SOLN
10.0000 [IU] | Freq: Once | INTRAMUSCULAR | Status: AC
  Administered 2023-07-12: 10 [IU] via INTRAVENOUS
  Filled 2023-07-12: qty 1

## 2023-07-12 MED ORDER — SODIUM CHLORIDE 0.9 % IV BOLUS
1000.0000 mL | Freq: Once | INTRAVENOUS | Status: AC
  Administered 2023-07-12: 1000 mL via INTRAVENOUS

## 2023-07-12 NOTE — ED Triage Notes (Signed)
 Pt here with abnormal labs from [facility], glucose 600. Pt denies any symptoms and states she is fine.

## 2023-07-12 NOTE — ED Notes (Signed)
 Date and time results received: 07/12/23 1534 Test: Glucose Critical Value: 567  Name of Provider Notified: MD Karlynn Oyster

## 2023-07-12 NOTE — Discharge Instructions (Signed)
 You were seen for your elevated blood sugar levels.  You do not have any severe side effects such as diabetic ketoacidosis.  Your kidney labs are slightly elevated but at a similar level to where they were 3 months ago and should be improved by the fluids I gave you today.  Please call your primary care provider to discuss outpatient management of your diabetes as your blood sugar continues to be high.  Please check your blood sugar a couple times a day and write these numbers down because if they are consistently high, they will need to make adjustments to your regimen.  If you have nausea, vomiting, diarrhea, or abdominal pain and blood sugar levels greater than 300, please be evaluated in the emergency department.  Make sure you are drinking plenty of fluids with low sugar content and decrease the amount of additional sweets you have.

## 2023-07-12 NOTE — ED Provider Notes (Signed)
 [facility] Provider Note    Event Date/Time   First MD Initiated Contact with Patient 07/12/23 1202     (approximate)   History   Abnormal Labs   HPI Cheyenne Martinez is a 48 y.o. female with history of DM2 presenting today for hyperglycemia.  Patient states she was getting basic outpatient labs by her provider for routine examination when she was found to be hyperglycemic around 600.  She denies any acute symptoms and is here with her mother who states she has been doing well recently.  No nausea, vomiting, abdominal pain, diarrhea, constipation, dysuria, or chest pain.  She takes metformin  for her diabetes.  No other acute complaints at this time.     Physical Exam   Triage Vital Signs: ED Triage Vitals [07/12/23 1158]  Encounter Vitals Group     BP 135/87     Girls Systolic BP Percentile      Girls Diastolic BP Percentile      Boys Systolic BP Percentile      Boys Diastolic BP Percentile      Pulse Rate 78     Resp 16     Temp 98.6 F (37 C)     Temp Source Oral     SpO2 100 %     Weight 163 lb 12.8 oz (74.3 kg)     Height 5' 6 (1.676 m)     Head Circumference      Peak Flow      Pain Score 0     Pain Loc      Pain Education      Exclude from Growth Chart     Most recent vital signs: Vitals:   07/12/23 1158  BP: 135/87  Pulse: 78  Resp: 16  Temp: 98.6 F (37 C)  SpO2: 100%   Physical Exam: I have reviewed the vital signs and nursing notes. General: Awake, alert, no acute distress.  Nontoxic appearing. Head:  Atraumatic, normocephalic.   ENT:  EOM intact, PERRL. Oral mucosa is pink and moist with no lesions. Neck: Neck is supple with full range of motion, No meningeal signs. Cardiovascular:  RRR, No murmurs. Peripheral pulses palpable and equal bilaterally. Respiratory:  Symmetrical chest wall expansion.  No rhonchi, rales, or wheezes.  Good air movement throughout.  No use of accessory muscles.   Musculoskeletal:  No cyanosis or  edema. Moving extremities with full ROM Abdomen:  Soft, nontender, nondistended. 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  These are all at baseline per patient and mom Psych:  Calm, appropriate.   Skin:  Warm, dry, no rash.    ED Results / Procedures / Treatments   Labs (all labs ordered are listed, but only abnormal results are displayed) Labs Reviewed  CBC WITH DIFFERENTIAL/PLATELET - Abnormal; Notable for the following components:      Result Value   RBC 3.74 (*)    Hemoglobin 10.2 (*)    HCT 28.9 (*)    MCV 77.3 (*)    All other components within normal limits  COMPREHENSIVE METABOLIC PANEL WITH GFR - Abnormal; Notable for the following components:   Sodium 127 (*)    Chloride 91 (*)    Glucose, Bld 567 (*)    Creatinine, Ser 1.91 (*)    GFR, Estimated 33 (*)    All other components within normal limits  URINALYSIS, ROUTINE W REFLEX MICROSCOPIC - Abnormal; Notable for the following components:  Color, Urine STRAW (*)    APPearance CLEAR (*)    Glucose, UA >=500 (*)    Bacteria, UA RARE (*)    All other components within normal limits  CBG MONITORING, ED - Abnormal; Notable for the following components:   Glucose-Capillary 540 (*)    All other components within normal limits  CBG MONITORING, ED - Abnormal; Notable for the following components:   Glucose-Capillary 424 (*)    All other components within normal limits     EKG    RADIOLOGY    PROCEDURES:  Critical Care performed: No  Procedures   MEDICATIONS ORDERED IN ED: Medications  sodium chloride  0.9 % bolus 1,000 mL (1,000 mLs Intravenous New Bag/Given 07/12/23 1230)  insulin  aspart (novoLOG ) injection 10 Units (10 Units Intravenous Given 07/12/23 1240)     IMPRESSION / MDM / ASSESSMENT AND PLAN / ED COURSE  I reviewed the triage vital signs and the nursing notes.                              Differential diagnosis includes, but is not limited to, asymptomatic  hyperglycemia, DKA, lower suspicion for HHS  Patient's presentation is most consistent with acute complicated illness / injury requiring diagnostic workup.  Patient is a 48 year old female presenting today for hyperglycemia on outpatient labs.  Vital signs and exam unremarkable on arrival with no other symptoms.  Blood sugar of 540 here.  Will evaluate for other concerns such as DKA although patient is completely asymptomatic so I have lower suspicion.  Will give 1 L of fluids as well as 10 units insulin  for blood sugar control.  Laboratory workup does not show any evidence of DKA with normal bicarb and no anion gap.  Her creatinine is 1.91 but reviewing recent labs show recent creatinines of 2.0 and 1.71.  This is overall consistent with her most recent baseline.  She is already getting 1 L of fluids which should help with her symptoms and tolerating p.o.  UA with no signs of ketones and CBC unremarkable.  Recheck of her glucose is downtrending to 424 was still having 400 cc of fluid left.  Will plan for discharge for asymptomatic hyperglycemia.  Instructed on increased fluid intake with decreased sugars in the fluid and decrease sweets separately.  Patient and mother are understanding and were given strict return precautions for symptoms that would be more consistent with DKA.  They will follow-up immediately with her outpatient PCP.  The patient is on the cardiac monitor to evaluate for evidence of arrhythmia and/or significant heart rate changes. Clinical Course as of 07/12/23 1610  Thu Jul 12, 2023  1533 Most recent creatinine of 1.7. Minor elevation but not gross AKI [DW]  1552 CBG now 424. Improving at this point and still asymptomatic. No evidence of DKA and at her baseline. Will discharge with PCP follow up once fluids are complete [DW]  1552 Glucose-Capillary(!): 424 [DW]    Clinical Course User Index [DW] Kandee Orion, MD     FINAL CLINICAL IMPRESSION(S) / ED DIAGNOSES   Final  diagnoses:  Hyperglycemia  Chronic kidney disease, unspecified CKD stage     Rx / DC Orders   ED Discharge Orders     None        Note:  This document was prepared using Dragon voice recognition software and may include unintentional dictation errors.   Kandee Orion, MD 07/12/23 [phone]

## 2023-07-16 ENCOUNTER — Ambulatory Visit: Payer: Medicaid Other | Admitting: Physical Therapy

## 2023-07-23 ENCOUNTER — Ambulatory Visit: Payer: Medicaid Other | Admitting: Physical Therapy

## 2023-07-30 ENCOUNTER — Ambulatory Visit: Payer: Medicaid Other | Admitting: Physical Therapy

## 2023-08-06 ENCOUNTER — Ambulatory Visit: Payer: Medicaid Other | Admitting: Physical Therapy

## 2023-08-13 ENCOUNTER — Ambulatory Visit: Payer: Medicaid Other | Admitting: Physical Therapy

## 2023-08-20 ENCOUNTER — Ambulatory Visit: Payer: Medicaid Other | Admitting: Physical Therapy

## 2023-09-30 IMAGING — MR MR HEAD WO/W CM
14 series · 48 of 48 positions shown · IV contrast (gadavist)
Comparison: Head CT from earlier the same day.

CLINICAL DATA: Initial evaluation for mental status change.

EXAM:
MRI HEAD WITHOUT AND WITH CONTRAST
TECHNIQUE: Multiplanar, multiecho pulse sequences of the brain and surrounding
structures were obtained without and with intravenous contrast.
CONTRAST:  7mL GADAVIST GADOBUTROL 1 MMOL/ML IV SOLN

[Series 5: ax dwi_tracew · axial · 3.0mm · 0.65mm/px · z∈[-114,+25]mm · 2 of 44 slices shown]
[im 1/44]
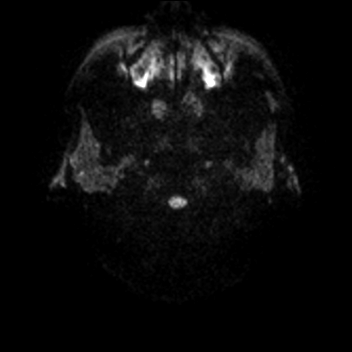
[im 44/44]
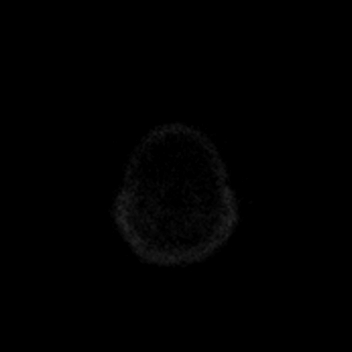

[Series 6: ax dwi_adc · axial · 3.0mm · 0.65mm/px · z∈[-114,+25]mm · 2 of 44 slices shown]
[im 1/44]
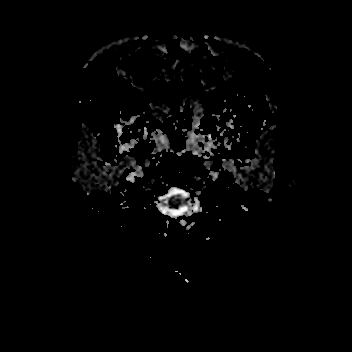
[im 44/44]
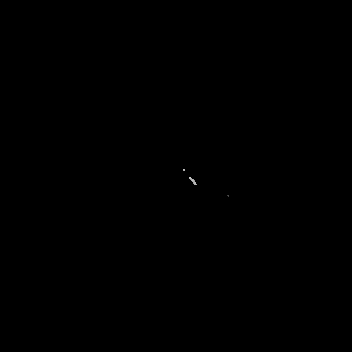

[Series 7: cor dwi_tracew · coronal · 5.0mm · 0.60mm/px · 2 of 36 slices shown]
[im 1/36]
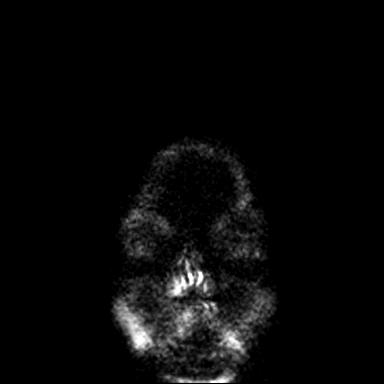
[im 36/36]
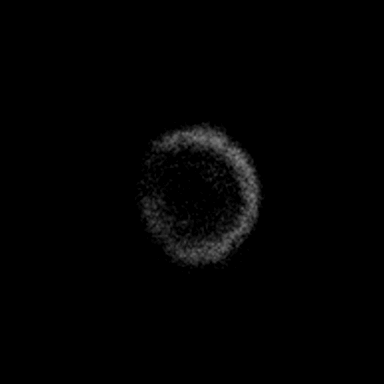

[Series 8: cor dwi_adc · coronal · 5.0mm · 0.60mm/px · 2 of 35 slices shown]
[im 1/35]
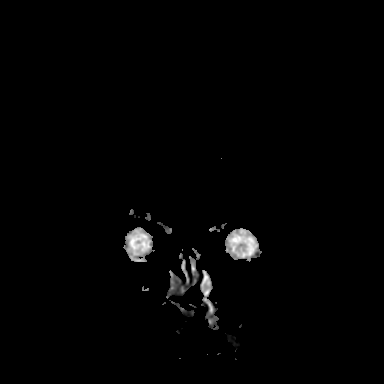
[im 35/35]
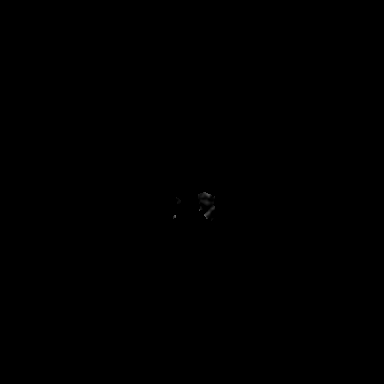

[Series 9: T1 · sagittal · 5.0mm · 0.62mm/px · 1 of 22 slices shown (1 of 2)]
[im 1/22]
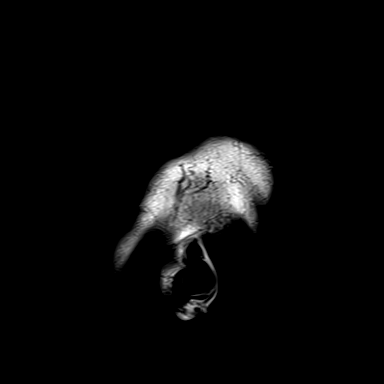

[Series 10: T2 · axial · 5.0mm · 0.53mm/px · 1 of 24 slices shown]
[im 1/24]
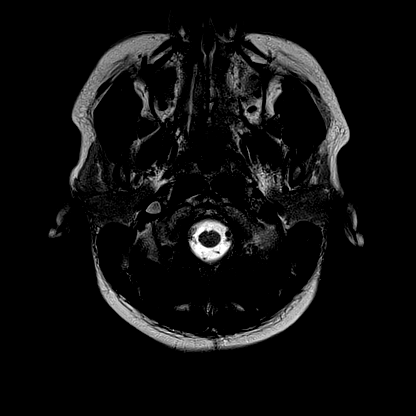

[Series 11: ax swi_mag · axial · 2.0mm · 0.90mm/px · z∈[-122,+33]mm · 5 of 80 slices shown]
[im 1/80]
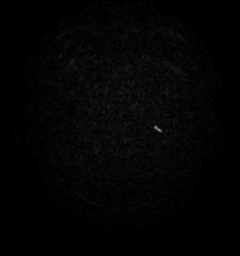
[im 20/80]
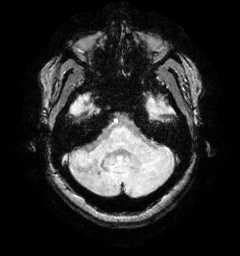
[im 40/80]
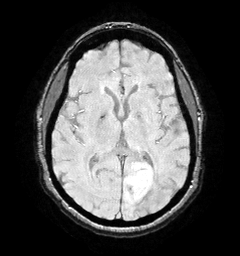
[im 60/80]
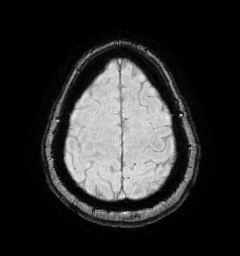
[im 80/80]
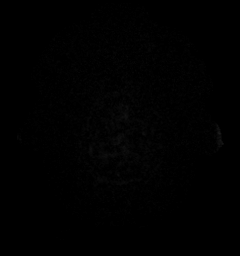

[Series 12: ax swi_pha · axial · 2.0mm · 0.90mm/px · z∈[-122,+33]mm · 5 of 80 slices shown]
[im 1/80]
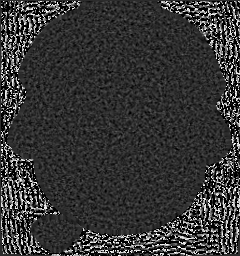
[im 20/80]
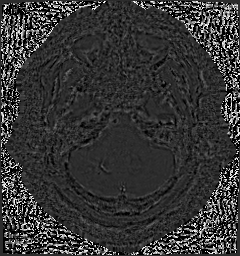
[im 40/80]
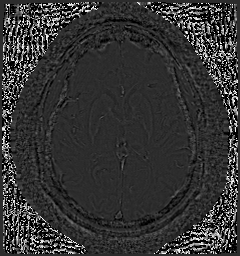
[im 60/80]
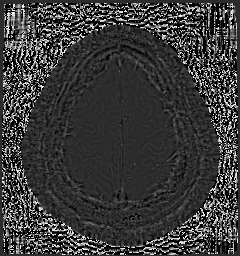
[im 80/80]
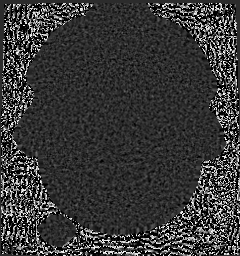

[Series 13: ax swi_swi · axial · 2.0mm · 0.90mm/px · z∈[-122,+33]mm · 5 of 80 slices shown]
[im 1/80]
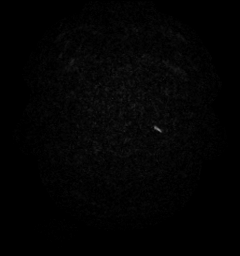
[im 20/80]
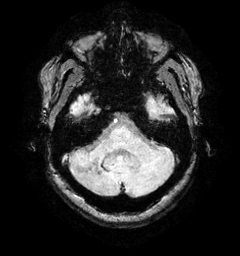
[im 40/80]
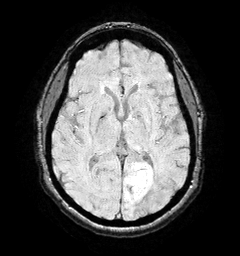
[im 60/80]
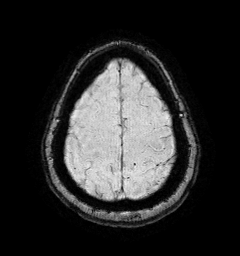
[im 80/80]
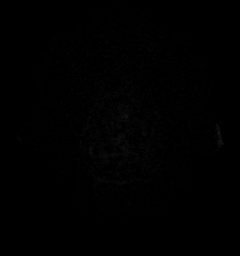

[Series 15: FLAIR · axial · 3.0mm · 0.53mm/px · z∈[-110,+22]mm · 3 of 46 slices shown]
[im 1/46]
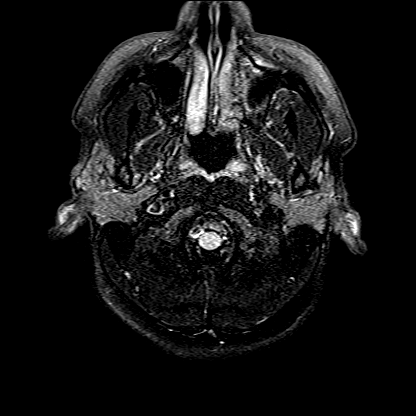
[im 23/46]
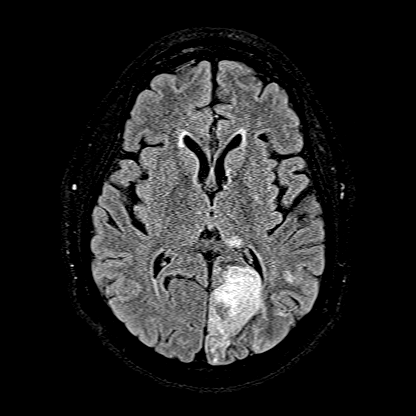
[im 46/46]
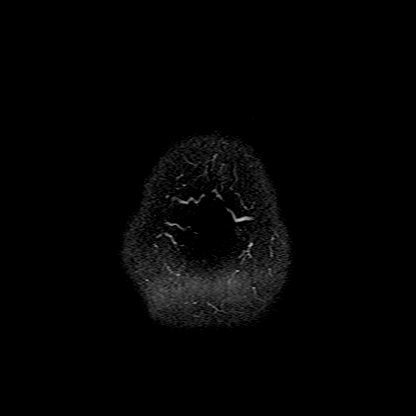

[Series 16: T1 · axial · 1.0mm · 0.98mm/px · z∈[-117,+23]mm · 8 of 144 slices shown (2 of 2)]
[im 1/144]
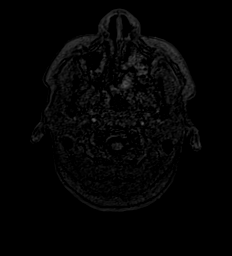
[im 21/144]
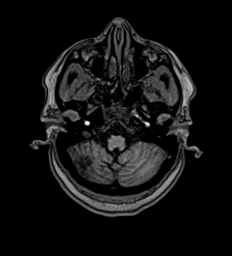
[im 41/144]
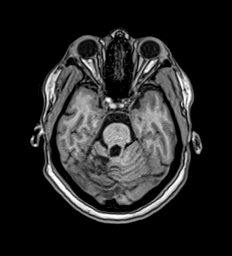
[im 62/144]
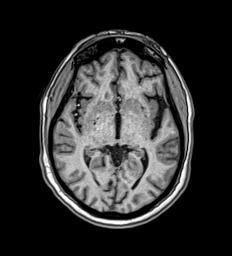
[im 82/144]
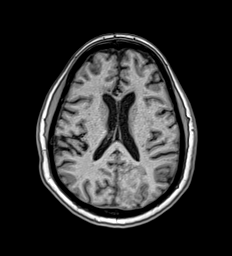
[im 103/144]
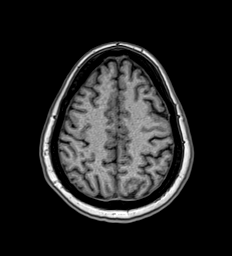
[im 123/144]
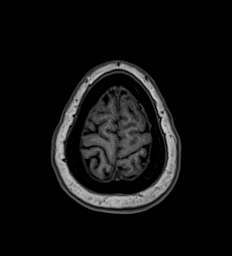
[im 144/144]
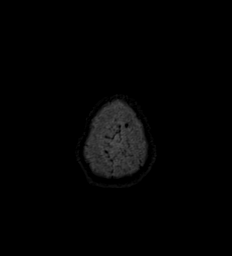

[Series 17: T2 post-contrast · coronal · 5.0mm · 0.57mm/px · 2 of 28 slices shown]
[im 1/28]
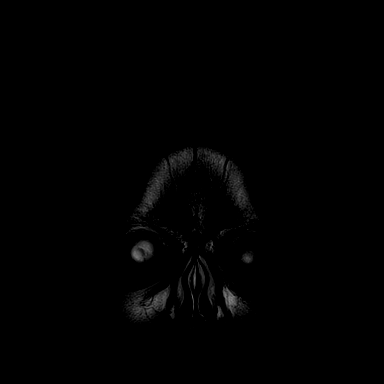
[im 28/28]
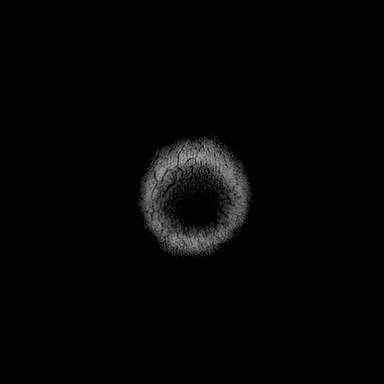

[Series 18: T1 post-contrast · axial · 1.0mm · 0.98mm/px · z∈[-117,+23]mm · 8 of 144 slices shown (1 of 2)]
[im 1/144]
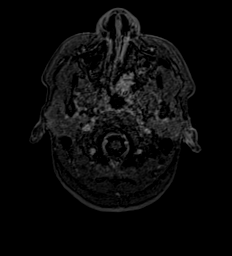
[im 21/144]
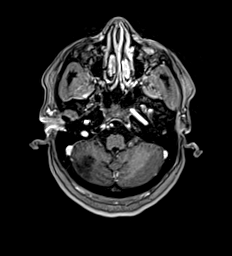
[im 41/144]
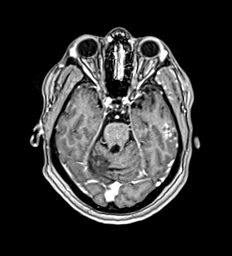
[im 62/144]
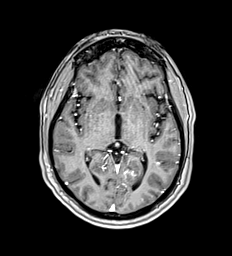
[im 82/144]
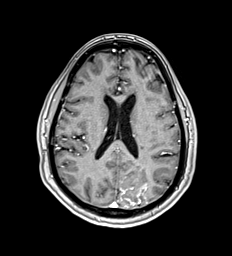
[im 103/144]
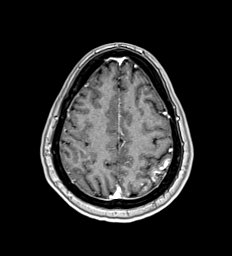
[im 123/144]
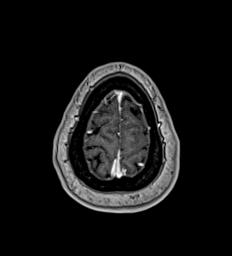
[im 144/144]
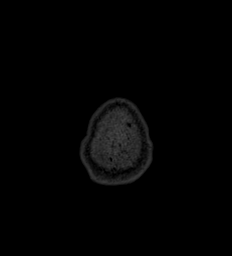

[Series 19: T1 post-contrast · coronal · 5.0mm · 0.57mm/px · 2 of 28 slices shown (2 of 2)]
[im 1/28]
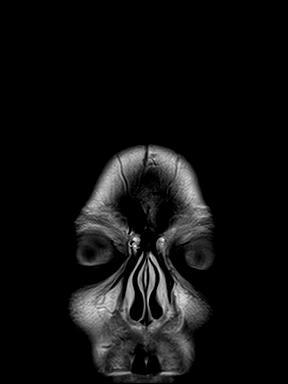
[im 28/28]
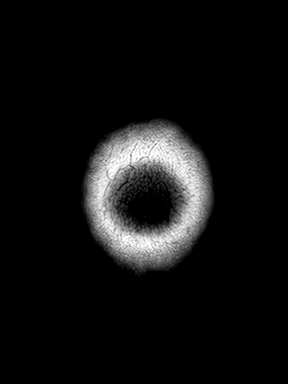

[48 of 48 positions shown; findings below may reference images not displayed]

FINDINGS: Brain: Cerebral volume within normal limits. Mild chronic
microvascular ischemic disease noted within the supratentorial
cerebral white matter. Chronic right cerebellar infarct noted.

Evolving restricted diffusion involving the mesial left occipital
lobe, consistent with an evolving early subacute left PCA
distribution infarct. Patchy involvement of the left thalamus.
Additional scattered small volume ischemic changes noted within the
cortical and subcortical left frontotemporal region. Small patchy
cortical infarct noted at the high right parietal lobe as well
(series 5, image 37). Associated petechial hemorrhage at the left
occipital lobe without frank hemorrhagic transformation or mass
effect Heidelberg classification 1a: HI1, scattered small petechiae,
no mass effect. Scattered irregular enhancement about the areas of
infarction and in the adjacent brain parenchyma, consistent with
subacute ischemia and probably reactive change.

Gray-white matter differentiation otherwise maintained. No mass
lesion or midline shift. No hydrocephalus or extra-axial fluid
collection. Pituitary gland suprasellar region within normal limits.
No other abnormal enhancement.

Vascular: Major intracranial vascular flow voids are maintained.

Skull and upper cervical spine: Craniocervical junction within
normal limits. Bone marrow signal intensity diffusely decreased on
T1 weighted sequence, nonspecific, but most commonly related to
anemia, smoking or obesity. No focal marrow replacing lesion. No
scalp soft tissue abnormality.

Sinuses/Orbits: Globes and orbital soft tissues within normal
limits. Paranasal sinuses are largely clear. No significant mastoid
effusion.

Other: None.
IMPRESSION: 1. Evolving early subacute left PCA distribution infarct as above,
with additional scattered small volume ischemic changes within the
left frontotemporal and right parietal lobe as above. Associated
mild petechial blood products at the left occipital lobe without
frank hemorrhagic transformation or significant mass effect.
2. Underlying mild chronic microvascular ischemic disease with
chronic right cerebellar infarct.

## 2023-11-22 ENCOUNTER — Other Ambulatory Visit: Payer: Self-pay | Admitting: Family Medicine

## 2023-11-22 DIAGNOSIS — Z1231 Encounter for screening mammogram for malignant neoplasm of breast: Secondary | ICD-10-CM

## 2023-12-25 ENCOUNTER — Encounter
